# Patient Record
Sex: Male | Born: 1970 | Race: Black or African American | Hispanic: No | Marital: Married | Smoking: Current some day smoker
Health system: Southern US, Community
[De-identification: ages and names within clinical notes are randomized; demographics above are authoritative.]

## PROBLEM LIST (undated history)

## (undated) DIAGNOSIS — E119 Type 2 diabetes mellitus without complications: Secondary | ICD-10-CM

## (undated) DIAGNOSIS — Z87442 Personal history of urinary calculi: Secondary | ICD-10-CM

## (undated) DIAGNOSIS — G43909 Migraine, unspecified, not intractable, without status migrainosus: Secondary | ICD-10-CM

## (undated) DIAGNOSIS — G629 Polyneuropathy, unspecified: Secondary | ICD-10-CM

## (undated) DIAGNOSIS — E669 Obesity, unspecified: Secondary | ICD-10-CM

## (undated) DIAGNOSIS — G4730 Sleep apnea, unspecified: Secondary | ICD-10-CM

## (undated) DIAGNOSIS — D72829 Elevated white blood cell count, unspecified: Secondary | ICD-10-CM

## (undated) DIAGNOSIS — I10 Essential (primary) hypertension: Secondary | ICD-10-CM

## (undated) DIAGNOSIS — R590 Localized enlarged lymph nodes: Principal | ICD-10-CM

## (undated) DIAGNOSIS — I639 Cerebral infarction, unspecified: Secondary | ICD-10-CM

## (undated) DIAGNOSIS — M5416 Radiculopathy, lumbar region: Secondary | ICD-10-CM

## (undated) DIAGNOSIS — E785 Hyperlipidemia, unspecified: Secondary | ICD-10-CM

## (undated) HISTORY — DX: Hyperlipidemia, unspecified: E78.5

## (undated) HISTORY — PX: CARPAL TUNNEL RELEASE: SHX101

## (undated) HISTORY — DX: Elevated white blood cell count, unspecified: D72.829

## (undated) HISTORY — DX: Cerebral infarction, unspecified: I63.9

## (undated) HISTORY — DX: Localized enlarged lymph nodes: R59.0

## (undated) HISTORY — DX: Radiculopathy, lumbar region: M54.16

## (undated) HISTORY — DX: Essential (primary) hypertension: I10

## (undated) HISTORY — DX: Type 2 diabetes mellitus without complications: E11.9

## (undated) HISTORY — PX: FRACTURE SURGERY: SHX138

---

## 2003-09-07 ENCOUNTER — Emergency Department (HOSPITAL_COMMUNITY): Admission: EM | Admit: 2003-09-07 | Discharge: 2003-09-07 | Payer: Self-pay | Admitting: Emergency Medicine

## 2005-10-01 ENCOUNTER — Encounter: Admission: RE | Admit: 2005-10-01 | Discharge: 2005-10-01 | Payer: Self-pay | Admitting: Family Medicine

## 2009-06-18 ENCOUNTER — Emergency Department (HOSPITAL_COMMUNITY): Admission: EM | Admit: 2009-06-18 | Discharge: 2009-06-19 | Payer: Self-pay | Admitting: Emergency Medicine

## 2009-08-16 ENCOUNTER — Observation Stay (HOSPITAL_COMMUNITY): Admission: EM | Admit: 2009-08-16 | Discharge: 2009-08-16 | Payer: Self-pay | Admitting: Emergency Medicine

## 2009-12-03 ENCOUNTER — Emergency Department (HOSPITAL_COMMUNITY): Admission: EM | Admit: 2009-12-03 | Discharge: 2009-12-03 | Payer: Self-pay | Admitting: Emergency Medicine

## 2009-12-26 ENCOUNTER — Ambulatory Visit (HOSPITAL_COMMUNITY): Admission: RE | Admit: 2009-12-26 | Discharge: 2009-12-26 | Payer: Self-pay | Admitting: Urology

## 2010-01-02 ENCOUNTER — Ambulatory Visit: Payer: Self-pay | Admitting: Oncology

## 2010-01-21 LAB — CBC WITH DIFFERENTIAL/PLATELET
Eosinophils Absolute: 0.4 10*3/uL (ref 0.0–0.5)
HCT: 43.3 % (ref 38.4–49.9)
LYMPH%: 43.4 % (ref 14.0–49.0)
MONO#: 0.6 10*3/uL (ref 0.1–0.9)
NEUT#: 4.7 10*3/uL (ref 1.5–6.5)
Platelets: 318 10*3/uL (ref 140–400)
RBC: 5.02 10*6/uL (ref 4.20–5.82)
WBC: 10.1 10*3/uL (ref 4.0–10.3)

## 2010-01-21 LAB — MORPHOLOGY: RBC Comments: NORMAL

## 2010-01-21 LAB — CHCC SMEAR

## 2010-01-22 LAB — COMPREHENSIVE METABOLIC PANEL
ALT: 22 U/L (ref 0–53)
AST: 24 U/L (ref 0–37)
Alkaline Phosphatase: 67 U/L (ref 39–117)
CO2: 21 mEq/L (ref 19–32)
Sodium: 140 mEq/L (ref 135–145)
Total Bilirubin: 0.5 mg/dL (ref 0.3–1.2)
Total Protein: 8.3 g/dL (ref 6.0–8.3)

## 2010-01-22 LAB — LACTATE DEHYDROGENASE: LDH: 293 U/L — ABNORMAL HIGH (ref 94–250)

## 2010-01-28 LAB — CMV ABS, IGG+IGM (CYTOMEGALOVIRUS)
CMV IgM: <8 AU/mL (ref <30.0)
Cytomegalovirus Ab-IgG: 7.9 IU/mL — ABNORMAL HIGH (ref <0.4)

## 2010-01-28 LAB — EPSTEIN-BARR VIRUS NUCLEAR ANTIGEN ANTIBODY, IGG: EBV NA IgG: 2.57 {ISR} — ABNORMAL HIGH

## 2010-01-28 LAB — EPSTEIN-BARR VIRUS VCA, IGG: EBV VCA IgG: 3.69 {ISR} — ABNORMAL HIGH

## 2010-01-28 LAB — EPSTEIN-BARR VIRUS EARLY D ANTIGEN ANTIBODY, IGG: EBV EA IgG: 1.27 {ISR} — ABNORMAL HIGH

## 2010-04-15 ENCOUNTER — Ambulatory Visit: Payer: Self-pay | Admitting: Oncology

## 2010-09-06 ENCOUNTER — Encounter: Payer: Self-pay | Admitting: Oncology

## 2010-10-29 ENCOUNTER — Emergency Department (HOSPITAL_COMMUNITY)
Admission: EM | Admit: 2010-10-29 | Discharge: 2010-10-29 | Disposition: A | Discharge status: Home / Self Care | Payer: Self-pay | Attending: Emergency Medicine | Admitting: Emergency Medicine

## 2010-10-29 DIAGNOSIS — H53149 Visual discomfort, unspecified: Secondary | ICD-10-CM | POA: Insufficient documentation

## 2010-10-29 DIAGNOSIS — G43909 Migraine, unspecified, not intractable, without status migrainosus: Secondary | ICD-10-CM | POA: Insufficient documentation

## 2010-10-29 DIAGNOSIS — Z79899 Other long term (current) drug therapy: Secondary | ICD-10-CM | POA: Insufficient documentation

## 2010-10-29 DIAGNOSIS — H539 Unspecified visual disturbance: Secondary | ICD-10-CM | POA: Insufficient documentation

## 2010-10-29 DIAGNOSIS — R209 Unspecified disturbances of skin sensation: Secondary | ICD-10-CM | POA: Insufficient documentation

## 2010-10-29 DIAGNOSIS — R110 Nausea: Secondary | ICD-10-CM | POA: Insufficient documentation

## 2010-10-29 DIAGNOSIS — I10 Essential (primary) hypertension: Secondary | ICD-10-CM | POA: Insufficient documentation

## 2010-11-01 LAB — BASIC METABOLIC PANEL
BUN: 9 mg/dL (ref 6–23)
Calcium: 8.7 mg/dL (ref 8.4–10.5)
Creatinine, Ser: 0.99 mg/dL (ref 0.4–1.5)
GFR calc non Af Amer: >60 mL/min (ref >60)
Glucose, Bld: 100 mg/dL — ABNORMAL HIGH (ref 70–99)

## 2010-11-01 LAB — CBC
MCHC: 33.6 g/dL (ref 30.0–36.0)
Platelets: 283 10*3/uL (ref 150–400)
RDW: 14.3 % (ref 11.5–15.5)

## 2010-11-01 LAB — APTT: aPTT: 28 seconds (ref 24–37)

## 2010-11-01 LAB — PROTIME-INR: INR: 0.89 (ref 0.00–1.49)

## 2010-11-01 IMAGING — CR DG CHEST 2V
2 series · 2 of 2 positions shown · non-contrast
Comparison: 10/01/2005

CLINICAL DATA: History of renal neoplasm.  Some shortness of
breath.  Nonsmoker.  Hypertension with no other chest complaints.

CHEST - 2 VIEW

[w chest pa]
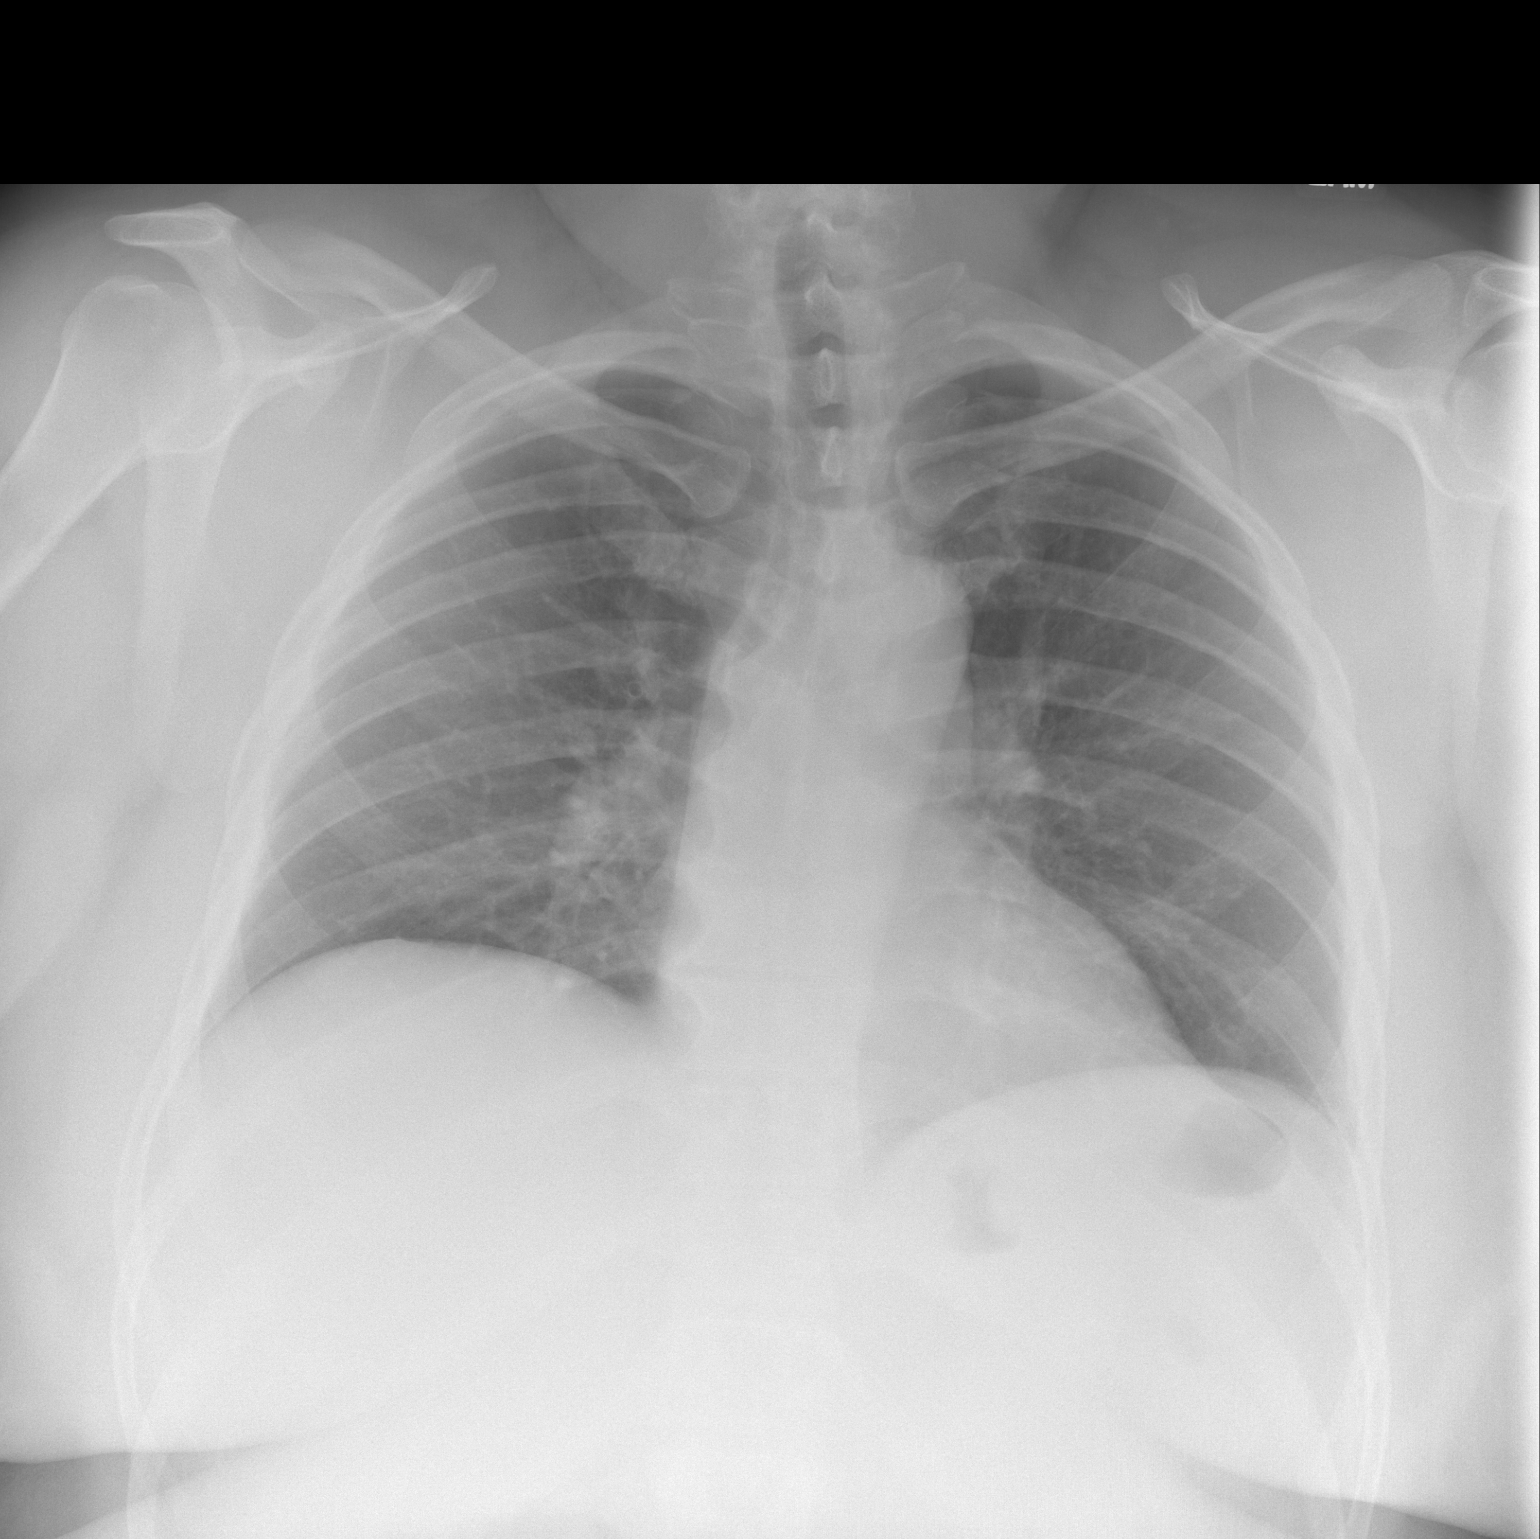

[w chest lat]
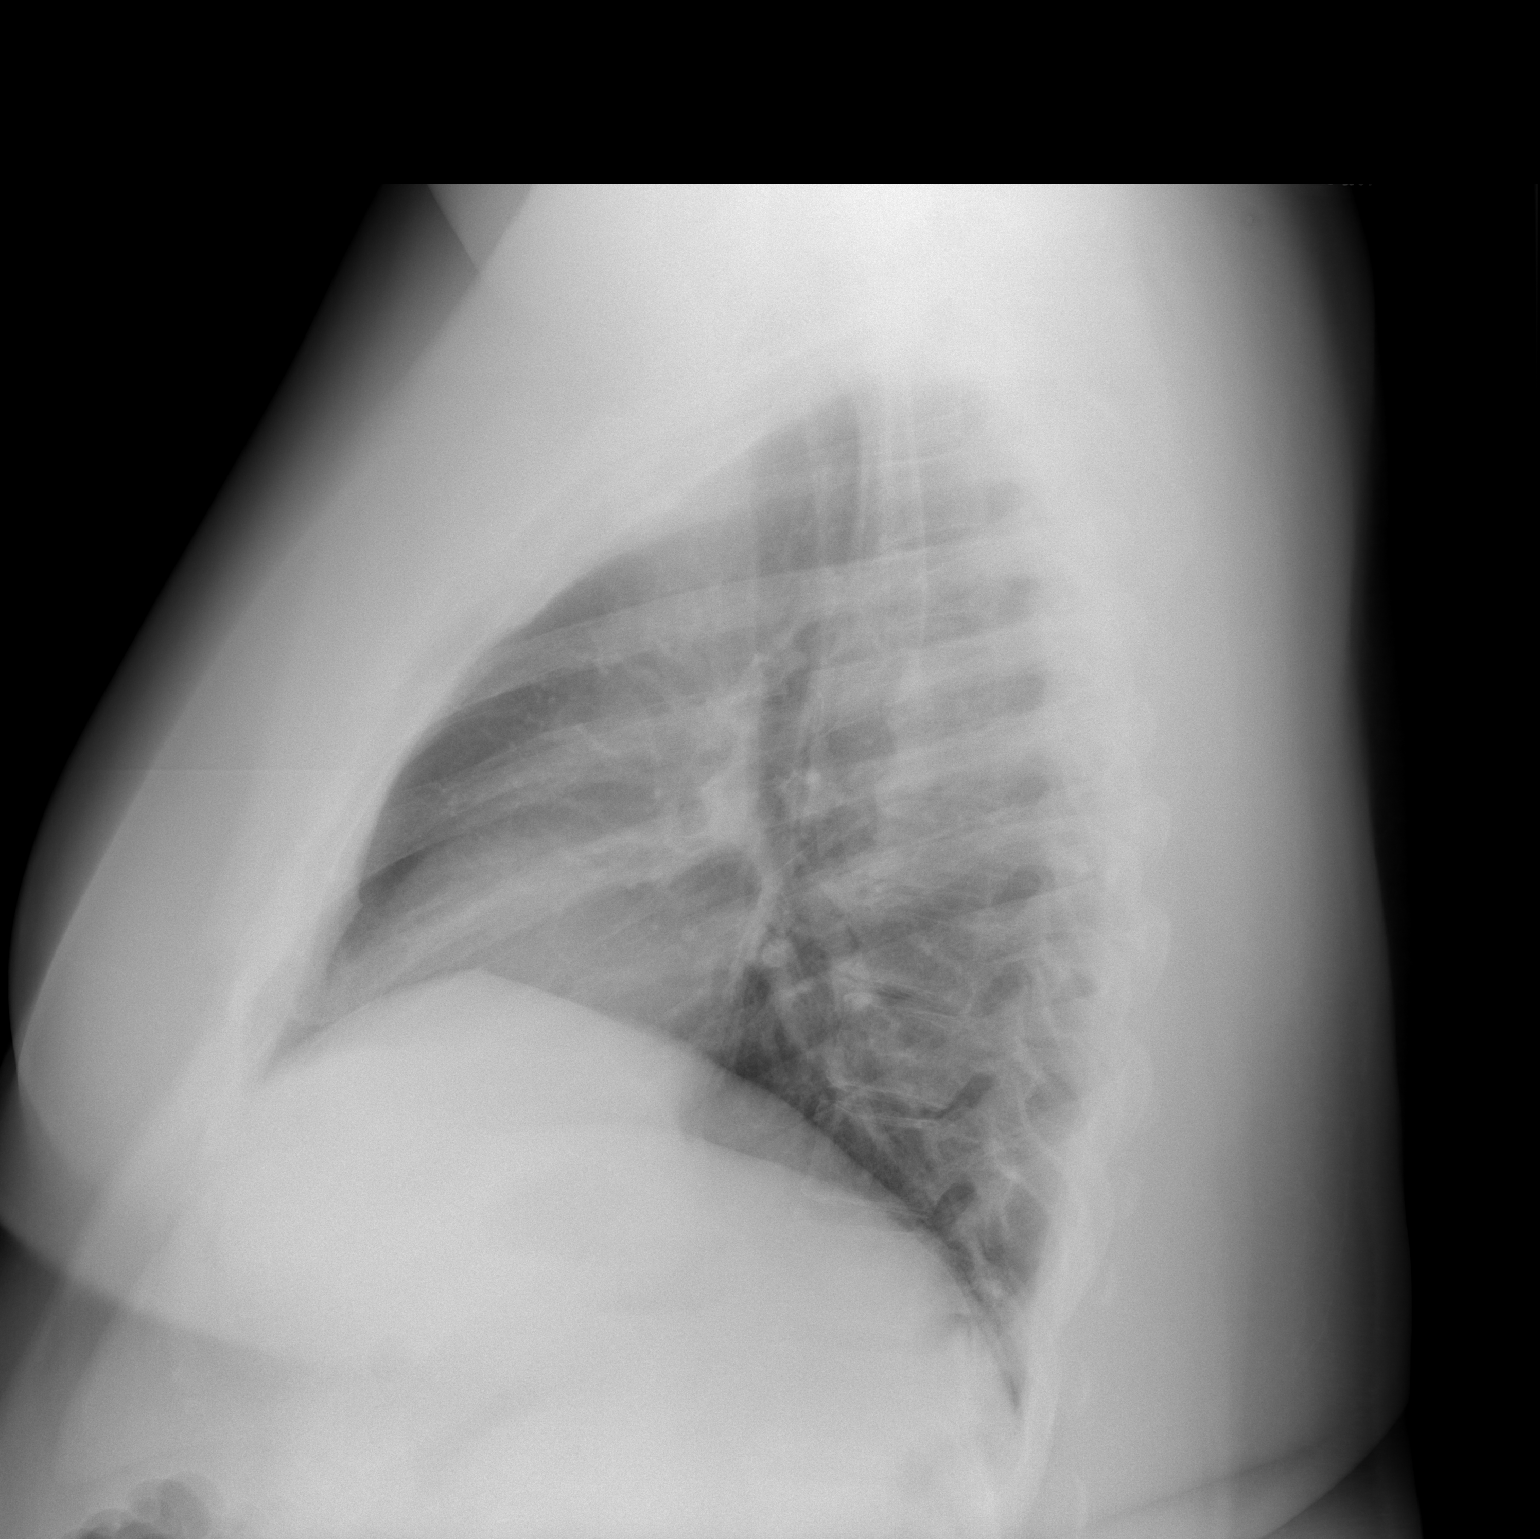

[2 of 2 positions shown; findings below may reference images not displayed]

FINDINGS: Low lung volumes are noted and taking this into
consideration heart size is within normal limits.  A normal
mediastinal configuration is seen.

The lung fields appear clear with no signs of focal infiltrate or
congestive failure.  No pleural fluid or peribronchial cuffing is
noted.

Bony structures demonstrate diffuse osteophytosis along the mid
thoracic spine and are otherwise intact.
IMPRESSION: No acute cardiopulmonary abnormality noted.

## 2010-11-18 LAB — URINALYSIS, ROUTINE W REFLEX MICROSCOPIC
Bilirubin Urine: NEGATIVE
Glucose, UA: NEGATIVE mg/dL
Hgb urine dipstick: NEGATIVE
Protein, ur: NEGATIVE mg/dL
Specific Gravity, Urine: 1.012 (ref 1.005–1.030)
Urobilinogen, UA: 1 mg/dL (ref 0.0–1.0)

## 2010-11-18 LAB — BASIC METABOLIC PANEL
CO2: 27 mEq/L (ref 19–32)
Calcium: 9.1 mg/dL (ref 8.4–10.5)
Chloride: 104 mEq/L (ref 96–112)
Glucose, Bld: 120 mg/dL — ABNORMAL HIGH (ref 70–99)
Sodium: 138 mEq/L (ref 135–145)

## 2011-12-16 ENCOUNTER — Emergency Department (HOSPITAL_COMMUNITY): Payer: Self-pay

## 2011-12-16 ENCOUNTER — Emergency Department (HOSPITAL_COMMUNITY)
Admission: EM | Admit: 2011-12-16 | Discharge: 2011-12-16 | Disposition: A | Discharge status: Home / Self Care | Payer: Self-pay | Attending: Emergency Medicine | Admitting: Emergency Medicine

## 2011-12-16 ENCOUNTER — Encounter (HOSPITAL_COMMUNITY): Payer: Self-pay | Admitting: *Deleted

## 2011-12-16 DIAGNOSIS — R51 Headache: Secondary | ICD-10-CM | POA: Insufficient documentation

## 2011-12-16 DIAGNOSIS — R000 Tachycardia, unspecified: Secondary | ICD-10-CM | POA: Insufficient documentation

## 2011-12-16 DIAGNOSIS — I10 Essential (primary) hypertension: Secondary | ICD-10-CM | POA: Insufficient documentation

## 2011-12-16 DIAGNOSIS — R42 Dizziness and giddiness: Secondary | ICD-10-CM | POA: Insufficient documentation

## 2011-12-16 DIAGNOSIS — R509 Fever, unspecified: Secondary | ICD-10-CM | POA: Insufficient documentation

## 2011-12-16 DIAGNOSIS — J189 Pneumonia, unspecified organism: Secondary | ICD-10-CM | POA: Insufficient documentation

## 2011-12-16 HISTORY — DX: Essential (primary) hypertension: I10

## 2011-12-16 MED ORDER — SODIUM CHLORIDE 0.9 % IV BOLUS (SEPSIS)
500.0000 mL | Freq: Once | INTRAVENOUS | Status: AC
  Administered 2011-12-16: 500 mL via INTRAVENOUS

## 2011-12-16 MED ORDER — CLONIDINE HCL 0.1 MG PO TABS
0.1000 mg | ORAL_TABLET | Freq: Once | ORAL | Status: AC
  Administered 2011-12-16: 0.1 mg via ORAL
  Filled 2011-12-16: qty 1

## 2011-12-16 MED ORDER — IBUPROFEN 800 MG PO TABS
800.0000 mg | ORAL_TABLET | Freq: Once | ORAL | Status: AC
  Administered 2011-12-16: 800 mg via ORAL
  Filled 2011-12-16: qty 1

## 2011-12-16 MED ORDER — AZITHROMYCIN 250 MG PO TABS: ORAL_TABLET | ORAL | Status: AC

## 2011-12-16 MED ORDER — CLONIDINE HCL 0.1 MG PO TABS: 0.1000 mg | ORAL_TABLET | Freq: Two times a day (BID) | ORAL | Status: DC

## 2011-12-16 MED ORDER — ACETAMINOPHEN 325 MG PO TABS
650.0000 mg | ORAL_TABLET | Freq: Once | ORAL | Status: AC
  Administered 2011-12-16: 650 mg via ORAL
  Filled 2011-12-16: qty 2

## 2011-12-16 NOTE — ED Notes (Signed)
Pt states "when I get up I'm dizzy, my body just aches, my ears hurt, had this old cough, have a headache"; pt admits productive cough that is yellowish-green

## 2011-12-16 NOTE — ED Provider Notes (Signed)
History     CSN: [id number]  Arrival date & time 12/16/11  3086   First MD Initiated Contact with Patient 12/16/11 1916      Chief Complaint  Patient presents with  . Cough  . Headache  . Otalgia  . Generalized Body Aches  . Dizziness    (Consider location/radiation/quality/duration/timing/severity/associated sxs/prior treatment) The history is provided by the patient.   41 year old male with a history of only hypertension presents to the emergency department with a chief complaint of fever for the last 2 days. It has been associated with bilateral ear pain, moderate intensity headache, nonproductive cough (to me, though he noted to triage nurse that it was productive of yellow sputum), and myalgias. Today, he began to arrange dizziness described as lightheadedness when he stood. The symptoms resolve after 1-2 minutes. He denies any visual disturbance, rhinorrhea or nasal congestion, sore throat, chest pain, shortness of breath, abdominal pain, nausea or vomiting, weakness or numbness to the arms or legs, trouble speaking, or difficulty ambulating. Denies any rash. Nothing makes the symptoms better or worse. Has taken ibuprofen a couple of times for the headaches with minimal relief. He should also notes that he suspects his blood pressure as high as he ran out of his blood pressure medication one week ago.  Past Medical History  Diagnosis Date  . Hypertension     History reviewed. No pertinent past surgical history.  No family history on file.  History  Substance Use Topics  . Smoking status: Never Smoker   . Smokeless tobacco: Not on file  . Alcohol Use: No      Review of Systems  Constitutional: Positive for fever and chills.  HENT: Positive for ear pain. Negative for hearing loss, congestion, sore throat, neck pain and neck stiffness.   Respiratory: Positive for cough. Negative for shortness of breath.   Genitourinary: Positive for frequency. Negative for dysuria.    Musculoskeletal: Positive for myalgias. Negative for gait problem.  Neurological: Positive for light-headedness and headaches. Negative for syncope, speech difficulty, weakness and numbness.  All other systems reviewed and are negative.    Allergies  Review of patient's allergies indicates no known allergies.  Home Medications   Current Outpatient Rx  Name Route Sig Dispense Refill  . CARVEDILOL 25 MG PO TABS Oral Take 25 mg by mouth 2 (two) times daily with a meal.    . CLONIDINE HCL 0.1 MG PO TABS Oral Take 0.1 mg by mouth 2 (two) times daily.    Marland Kitchen FEXOFENADINE-PSEUDOEPHED ER 180-240 MG PO TB24 Oral Take 1 tablet by mouth daily.    . FUROSEMIDE 40 MG PO TABS Oral Take 40 mg by mouth daily.    . IBUPROFEN 400 MG PO TABS Oral Take 400 mg by mouth every 6 (six) hours as needed. For headache pain relief      BP 171/102  Pulse 122  Temp(Src) 102.5 F (39.2 C) (Oral)  Resp 22  Wt 351 lb (159.213 kg)  SpO2 95%  Physical Exam Vital signs are reviewed and are significant for fever, tachycardia, hypertension. Nursing note reviewed. Patient is well-developed, well-nourished, uncomfortable appearing. Nontoxic appearing. Head is normocephalic and atraumatic. Bilateral external ears, ear canals, tympanic membranes are normal. Nose is normal. Tongue with thick white substance, easily scraped off with no tenderness or erythema seen below. Oropharynx with several areas of erythema and exudate. Are equal, round, reactive to light. Conjunctiva are normal. Extraocular movements are intact without nystagmus. Neck is supple without adenopathy.  Heart tachycardia, regular rhythm. Normal heart sounds. Normal respiratory effort and excursion. Lungs are clear to auscultation bilaterally. Abdomen soft, obese. Nontender. Normal bowel sounds.  Extremities are without edema or tenderness. Is alert and oriented x3. Cranial nerves II through XII are tested and are intact. Finger to nose is intact  bilaterally. Gait is normal. Romberg negative. Speech is clear. Bilateral grip strength 5 out of 5. Skin is warm and dry without any obvious rash or lesion. ED Course  Procedures (including critical care time)  Labs Reviewed  GLUCOSE, CAPILLARY - Abnormal; Notable for the following:    Glucose-Capillary 162 (*)    All other components within normal limits  RAPID STREP SCREEN   Dg Chest 2 View  12/16/2011  *RADIOLOGY REPORT*  Clinical Data: Cough and dizziness.  CHEST - 2 VIEW  Comparison: 12/26/2009.  Findings: The cardiac silhouette, mediastinal and hilar contours are within normal limits and stable.  There is airspace opacity in the left lower lobe consistent with pneumonia.  No pleural effusion.  The right lung is clear.  The bony thorax is intact.  IMPRESSION: Left lower lobe pneumonia.  Original Report Authenticated By: P. Loralie Champagne, M.D.     Dx 1: CAP Dx 2: HTN   MDM  2 day of illness. Given fever, cough will obtain CXR to r/o pneumonia. Strep screen ordered due to fever and exudate seen in oropharynx. Blood sugar checked due to thick white material on tongue and polyuria. Fever treated with ibuprofen. Will monitor closely.     9:12 PM LLL pneumonia. Will tx for CAP. Mild hyperglycemia, discussed appropriate follow-up. Remains tachycardic. Will give acetaminophen, small fluid bolus.    10:51 PM Fever, tachycardia resolved. Pt ready for d/c home. Return precautions discussed       Shaaron Adler, PA-C 12/16/11 2252

## 2011-12-16 NOTE — Discharge Instructions (Signed)
Pneumonia, Adult Pneumonia is an infection of the lungs.  CAUSES Pneumonia may be caused by bacteria or a virus. Usually, these infections are caused by breathing infectious particles into the lungs (respiratory tract). SYMPTOMS   Cough.   Fever.   Chest pain.   Increased rate of breathing.   Wheezing.   Mucus production.  DIAGNOSIS  If you have the common symptoms of pneumonia, your caregiver will typically confirm the diagnosis with a chest X-ray. The X-ray will show an abnormality in the lung (pulmonary infiltrate) if you have pneumonia. Other tests of your blood, urine, or sputum may be done to find the specific cause of your pneumonia. Your caregiver may also do tests (blood gases or pulse oximetry) to see how well your lungs are working. TREATMENT  Some forms of pneumonia may be spread to other people when you cough or sneeze. You may be asked to wear a mask before and during your exam. Pneumonia that is caused by bacteria is treated with antibiotic medicine. Pneumonia that is caused by the influenza virus may be treated with an antiviral medicine. Most other viral infections must run their course. These infections will not respond to antibiotics.  PREVENTION A pneumococcal shot (vaccine) is available to prevent a common bacterial cause of pneumonia. This is usually suggested for:  People over 65 years old.   Patients on chemotherapy.   People with chronic lung problems, such as bronchitis or emphysema.   People with immune system problems.  If you are over 65 or have a high risk condition, you may receive the pneumococcal vaccine if you have not received it before. In some countries, a routine influenza vaccine is also recommended. This vaccine can help prevent some cases of pneumonia.You may be offered the influenza vaccine as part of your care. If you smoke, it is time to quit. You may receive instructions on how to stop smoking. Your caregiver can provide medicines and  counseling to help you quit. HOME CARE INSTRUCTIONS   Cough suppressants may be used if you are losing too much rest. However, coughing protects you by clearing your lungs. You should avoid using cough suppressants if you can.   Your caregiver may have prescribed medicine if he or she thinks your pneumonia is caused by a bacteria or influenza. Finish your medicine even if you start to feel better.   Your caregiver may also prescribe an expectorant. This loosens the mucus to be coughed up.   Only take over-the-counter or prescription medicines for pain, discomfort, or fever as directed by your caregiver.   Do not smoke. Smoking is a common cause of bronchitis and can contribute to pneumonia. If you are a smoker and continue to smoke, your cough may last several weeks after your pneumonia has cleared.   A cold steam vaporizer or humidifier in your room or home may help loosen mucus.   Coughing is often worse at night. Sleeping in a semi-upright position in a recliner or using a couple pillows under your head will help with this.   Get rest as you feel it is needed. Your body will usually let you know when you need to rest.  SEEK IMMEDIATE MEDICAL CARE IF:   Your illness becomes worse. This is especially true if you are elderly or weakened from any other disease.   You cannot control your cough with suppressants and are losing sleep.   You begin coughing up blood.   You develop pain which is getting worse or   is uncontrolled with medicines.   You have a fever.   Any of the symptoms which initially brought you in for treatment are getting worse rather than better.   You develop shortness of breath or chest pain.  MAKE SURE YOU:   Understand these instructions.   Will watch your condition.   Will get help right away if you are not doing well or get worse.  Document Released: 08/02/2005 Document Revised: 07/22/2011 Document Reviewed: 10/22/2010 [facility] Patient Information 2012  [place], [place].            Hypertension Information As your heart beats, it forces blood through your arteries. This force is your blood pressure. If the pressure is too high, it is called hypertension (HTN) or high blood pressure. HTN is dangerous because you may have it and not know it. High blood pressure may mean that your heart has to work harder to pump blood. Your arteries may be narrow or stiff. The extra work puts you at risk for heart disease, stroke, and other problems.  Blood pressure consists of two numbers, a higher number over a lower, 110/72, for example. It is stated as "110 over 72." The ideal is below 120 for the top number (systolic) and under 80 for the bottom (diastolic).  You should pay close attention to your blood pressure if you have certain conditions such as:  Heart failure.   Prior heart attack.   Diabetes   Chronic kidney disease.   Prior stroke.   Multiple risk factors for heart disease.  To see if you have HTN, your blood pressure should be measured while you are seated with your arm held at the level of the heart. It should be measured at least twice. A one-time elevated blood pressure reading (especially in the Emergency Department) does not mean that you need treatment. There may be conditions in which the blood pressure is different between your right and left arms. It is important to see your caregiver soon for a recheck. Most people have essential hypertension which means that there is not a specific cause. This type of high blood pressure may be lowered by changing lifestyle factors such as:  Stress.   Smoking.   Lack of exercise.   Excessive weight.   Drug/tobacco/alcohol use.   Eating less salt.  Most people do not have symptoms from high blood pressure until it has caused damage to the body. Effective treatment can often prevent, delay or reduce that damage. TREATMENT  Treatment for high blood pressure, when a cause has been  identified, is directed at the cause. There are a large number of medications to treat HTN. These fall into several categories, and your caregiver will help you select the medicines that are best for you. Medications may have side effects. You should review side effects with your caregiver. If your blood pressure stays high after you have made lifestyle changes or started on medicines,   Your medication(s) may need to be changed.   Other problems may need to be addressed.   Be certain you understand your prescriptions, and know how and when to take your medicine.   Be sure to follow up with your caregiver within the time frame advised (usually within two weeks) to have your blood pressure rechecked and to review your medications.   If you are taking more than one medicine to lower your blood pressure, make sure you know how and at what times they should be taken. Taking two medicines at the  same time can result in blood pressure that is too low.  Document Released: 10/05/2005 Document Revised: 04/14/2011 Document Reviewed: 10/12/2007 [facility] Patient Information 2012 [place], [place].

## 2011-12-18 NOTE — ED Provider Notes (Signed)
Medical screening examination/treatment/procedure(s) were performed by non-physician practitioner and as supervising physician I was immediately available for consultation/collaboration.  Cyndra Numbers, MD 12/18/11 [phone]

## 2012-05-03 ENCOUNTER — Emergency Department (HOSPITAL_COMMUNITY)
Admission: EM | Admit: 2012-05-03 | Discharge: 2012-05-03 | Disposition: A | Discharge status: Home / Self Care | Payer: BC Managed Care – PPO | Attending: Emergency Medicine | Admitting: Emergency Medicine

## 2012-05-03 ENCOUNTER — Encounter (HOSPITAL_COMMUNITY): Payer: Self-pay | Admitting: *Deleted

## 2012-05-03 ENCOUNTER — Emergency Department (HOSPITAL_COMMUNITY): Payer: BC Managed Care – PPO

## 2012-05-03 DIAGNOSIS — H538 Other visual disturbances: Secondary | ICD-10-CM | POA: Insufficient documentation

## 2012-05-03 DIAGNOSIS — R51 Headache: Secondary | ICD-10-CM | POA: Insufficient documentation

## 2012-05-03 DIAGNOSIS — I10 Essential (primary) hypertension: Secondary | ICD-10-CM | POA: Insufficient documentation

## 2012-05-03 DIAGNOSIS — Z79899 Other long term (current) drug therapy: Secondary | ICD-10-CM | POA: Insufficient documentation

## 2012-05-03 LAB — BASIC METABOLIC PANEL
BUN: 10 mg/dL (ref 6–23)
Chloride: 97 mEq/L (ref 96–112)
GFR calc Af Amer: >90 mL/min (ref >90)
Glucose, Bld: 173 mg/dL — ABNORMAL HIGH (ref 70–99)
Potassium: 3.3 mEq/L — ABNORMAL LOW (ref 3.5–5.1)

## 2012-05-03 LAB — CBC
HCT: 42.7 % (ref 39.0–52.0)
Hemoglobin: 14.3 g/dL (ref 13.0–17.0)
MCH: 28 pg (ref 26.0–34.0)
MCHC: 33.5 g/dL (ref 30.0–36.0)

## 2012-05-03 MED ORDER — CARVEDILOL 25 MG PO TABS: 25.0000 mg | ORAL_TABLET | Freq: Two times a day (BID) | ORAL | Status: DC

## 2012-05-03 MED ORDER — FUROSEMIDE 40 MG PO TABS: 40.0000 mg | ORAL_TABLET | Freq: Every day | ORAL | Status: DC

## 2012-05-03 MED ORDER — SODIUM CHLORIDE 0.9 % IV SOLN
INTRAVENOUS | Status: DC
  Administered 2012-05-03: 16:00:00 via INTRAVENOUS

## 2012-05-03 MED ORDER — CLONIDINE HCL 0.1 MG PO TABS: 0.1000 mg | ORAL_TABLET | Freq: Two times a day (BID) | ORAL | Status: DC

## 2012-05-03 MED ORDER — METOCLOPRAMIDE HCL 5 MG/ML IJ SOLN
10.0000 mg | Freq: Once | INTRAMUSCULAR | Status: AC
  Administered 2012-05-03: 10 mg via INTRAVENOUS
  Filled 2012-05-03: qty 2

## 2012-05-03 MED ORDER — DIPHENHYDRAMINE HCL 50 MG/ML IJ SOLN
25.0000 mg | Freq: Once | INTRAMUSCULAR | Status: AC
  Administered 2012-05-03: 25 mg via INTRAVENOUS
  Filled 2012-05-03: qty 1

## 2012-05-03 MED ORDER — AMLODIPINE BESY-BENAZEPRIL HCL 5-20 MG PO CAPS: 1.0000 | ORAL_CAPSULE | Freq: Every day | ORAL | Status: DC

## 2012-05-03 NOTE — ED Notes (Signed)
Pt states around 11AM today went outside and experienced sudden onset of blurred vision, HA, and numbness to right hand. States "I feel better now." no neuro deficits noted. Pt A&Ox's 3 at present.

## 2012-05-03 NOTE — ED Notes (Signed)
3 unsuccessful attempts to start pts IV. [place] RN at bedside to attempt. Labs collected. 1-light green,1-lavender, 1 light blue collected.

## 2012-05-03 NOTE — ED Provider Notes (Signed)
History     CSN: [id number]  Arrival date & time 05/03/12  1352   First MD Initiated Contact with Patient 05/03/12 1503      Chief Complaint  Patient presents with  . Headache  . Blurred Vision    (Consider location/radiation/quality/duration/timing/severity/associated sxs/prior treatment) The history is provided by the patient.   Patient here complaining of headache that started today 4 hours prior to arrival. Headache was localized to the right side of his head associated with blurred vision and numbness to his right hand. History of similar symptoms in the past and states that this is not the worse headache he has ever had. Denies any neck pain or vomiting. States his headache is gradually resolving. No recent fever or chills. In the past when he has had this has been associated with his high blood pressure. He notes recent noncompliance with his blood pressure meds due 2 lack of insurance. Denies any gait trouble. No change in speech Past Medical History  Diagnosis Date  . Hypertension     History reviewed. No pertinent past surgical history.  History reviewed. No pertinent family history.  History  Substance Use Topics  . Smoking status: Never Smoker   . Smokeless tobacco: Not on file  . Alcohol Use: No      Review of Systems  All other systems reviewed and are negative.    Allergies  Review of patient's allergies indicates no known allergies.  Home Medications   Current Outpatient Rx  Name Route Sig Dispense Refill  . AMLODIPINE BESY-BENAZEPRIL HCL 5-20 MG PO CAPS Oral Take 1 capsule by mouth daily.    Marland Kitchen CARVEDILOL 25 MG PO TABS Oral Take 25 mg by mouth 2 (two) times daily with a meal.    . CLONIDINE HCL 0.1 MG PO TABS Oral Take 1 tablet (0.1 mg total) by mouth 2 (two) times daily. 60 tablet 0  . FUROSEMIDE 40 MG PO TABS Oral Take 40 mg by mouth daily.    Marland Kitchen HYDROCODONE-ACETAMINOPHEN 5-325 MG PO TABS Oral Take 1 tablet by mouth every 6 (six) hours as needed.  Pain    . IBUPROFEN 400 MG PO TABS Oral Take 400 mg by mouth every 6 (six) hours as needed. For headache pain relief      BP 176/109  Pulse 93  Temp 99 F (37.2 C) (Oral)  Resp 23  SpO2 98%  Physical Exam  Nursing note and vitals reviewed. Constitutional: He is oriented to person, place, and time. He appears well-developed and well-nourished.  Non-toxic appearance. No distress.  HENT:  Head: Normocephalic and atraumatic.  Eyes: Conjunctivae normal, EOM and lids are normal. Pupils are equal, round, and reactive to light.  Neck: Normal range of motion. Neck supple. No tracheal deviation present. No mass present.  Cardiovascular: Normal rate, regular rhythm and normal heart sounds.  Exam reveals no gallop.   No murmur heard. Pulmonary/Chest: Effort normal and breath sounds normal. No stridor. No respiratory distress. He has no decreased breath sounds. He has no wheezes. He has no rhonchi. He has no rales.  Abdominal: Soft. Normal appearance and bowel sounds are normal. He exhibits no distension. There is no tenderness. There is no rebound and no CVA tenderness.  Musculoskeletal: Normal range of motion. He exhibits no edema and no tenderness.  Neurological: He is alert and oriented to person, place, and time. He has normal strength. No cranial nerve deficit or sensory deficit. GCS eye subscore is 4. GCS verbal subscore is 5.  GCS motor subscore is 6.  Skin: Skin is warm and dry. No abrasion and no rash noted.  Psychiatric: He has a normal mood and affect. His speech is normal and behavior is normal.    ED Course  Procedures (including critical care time)   Labs Reviewed  BASIC METABOLIC PANEL   No results found.   No diagnosis found.    MDM  Patient given medication for his headache and feels better. No concern for subarachnoid hemorrhage or TIA. He feels that his baseline at this time. Will prescribe his antihypertensive medications and he will followup with his  Dr.        Toy Baker, MD 05/03/12 [phone]

## 2012-05-18 ENCOUNTER — Emergency Department (HOSPITAL_COMMUNITY)
Admission: EM | Admit: 2012-05-18 | Discharge: 2012-05-18 | Disposition: A | Discharge status: Home / Self Care | Payer: BC Managed Care – PPO | Attending: Emergency Medicine | Admitting: Emergency Medicine

## 2012-05-18 ENCOUNTER — Encounter (HOSPITAL_COMMUNITY): Payer: Self-pay | Admitting: Emergency Medicine

## 2012-05-18 ENCOUNTER — Emergency Department (HOSPITAL_COMMUNITY): Payer: BC Managed Care – PPO

## 2012-05-18 DIAGNOSIS — Z79899 Other long term (current) drug therapy: Secondary | ICD-10-CM | POA: Insufficient documentation

## 2012-05-18 DIAGNOSIS — R51 Headache: Secondary | ICD-10-CM | POA: Insufficient documentation

## 2012-05-18 DIAGNOSIS — H538 Other visual disturbances: Secondary | ICD-10-CM | POA: Insufficient documentation

## 2012-05-18 DIAGNOSIS — I959 Hypotension, unspecified: Secondary | ICD-10-CM

## 2012-05-18 DIAGNOSIS — E119 Type 2 diabetes mellitus without complications: Secondary | ICD-10-CM | POA: Insufficient documentation

## 2012-05-18 DIAGNOSIS — R42 Dizziness and giddiness: Secondary | ICD-10-CM | POA: Insufficient documentation

## 2012-05-18 DIAGNOSIS — I10 Essential (primary) hypertension: Secondary | ICD-10-CM | POA: Insufficient documentation

## 2012-05-18 LAB — COMPREHENSIVE METABOLIC PANEL
Alkaline Phosphatase: 71 U/L (ref 39–117)
BUN: 11 mg/dL (ref 6–23)
CO2: 23 mEq/L (ref 19–32)
Chloride: 98 mEq/L (ref 96–112)
Creatinine, Ser: 0.94 mg/dL (ref 0.50–1.35)
GFR calc Af Amer: >90 mL/min (ref >90)
GFR calc non Af Amer: >90 mL/min (ref >90)
Glucose, Bld: 98 mg/dL (ref 70–99)
Total Bilirubin: 0.5 mg/dL (ref 0.3–1.2)

## 2012-05-18 LAB — CBC WITH DIFFERENTIAL/PLATELET
Basophils Relative: 1 % (ref 0–1)
HCT: 44.7 % (ref 39.0–52.0)
Hemoglobin: 15.2 g/dL (ref 13.0–17.0)
Lymphocytes Relative: 39 % (ref 12–46)
Lymphs Abs: 3.5 10*3/uL (ref 0.7–4.0)
MCHC: 34 g/dL (ref 30.0–36.0)
Monocytes Absolute: 0.8 10*3/uL (ref 0.1–1.0)
Monocytes Relative: 9 % (ref 3–12)
Neutro Abs: 4.3 10*3/uL (ref 1.7–7.7)
RBC: 5.42 MIL/uL (ref 4.22–5.81)

## 2012-05-18 LAB — URINALYSIS, ROUTINE W REFLEX MICROSCOPIC
Ketones, ur: NEGATIVE mg/dL
Leukocytes, UA: NEGATIVE
Protein, ur: 30 mg/dL — AB
Urobilinogen, UA: 1 mg/dL (ref 0.0–1.0)

## 2012-05-18 LAB — TROPONIN I: Troponin I: <0.3 ng/mL (ref <0.30)

## 2012-05-18 LAB — URINE MICROSCOPIC-ADD ON

## 2012-05-18 LAB — GLUCOSE, CAPILLARY

## 2012-05-18 MED ORDER — MORPHINE SULFATE 2 MG/ML IJ SOLN
2.0000 mg | Freq: Once | INTRAMUSCULAR | Status: AC
  Administered 2012-05-18: 2 mg via INTRAVENOUS
  Filled 2012-05-18: qty 1

## 2012-05-18 MED ORDER — SODIUM CHLORIDE 0.9 % IV BOLUS (SEPSIS)
500.0000 mL | Freq: Once | INTRAVENOUS | Status: AC
  Administered 2012-05-18: 500 mL via INTRAVENOUS

## 2012-05-18 MED ORDER — METOCLOPRAMIDE HCL 5 MG/ML IJ SOLN
10.0000 mg | Freq: Once | INTRAMUSCULAR | Status: AC
  Administered 2012-05-18: 10 mg via INTRAVENOUS
  Filled 2012-05-18: qty 2

## 2012-05-18 NOTE — ED Notes (Signed)
Per EMS: pt was pumping gas when he had an episode of blurred vision.  Denies any LOC or N/V. Vision returned to normal and asked to be brought to ED for evaluation. Stroke score was negative. Per pt: pt states that he was at the gas station and began experiencing confusion.  States that he could not remember his children's names and had difficulty communicating with the attendant that he wanted to pay for gas.  States that he began experiencing blurred vision and felt "hot".  States that he sat for 10 min and then made his way to his son's place of business.  States that his vision returned to normal and wanted to come in to be evaluated.

## 2012-05-18 NOTE — ED Notes (Signed)
[id number]<WG> Expected date:<BR> Expected time: 4:10 PM<BR> Means of arrival:Ambulance<BR> Comments:<BR> 41yoM- blurred vision

## 2012-05-18 NOTE — ED Provider Notes (Addendum)
History     CSN: [id number]  Arrival date & time 05/18/12  1617   First MD Initiated Contact with Patient 05/18/12 1627      Chief Complaint  Patient presents with  . Blurred Vision    (Consider location/radiation/quality/duration/timing/severity/associated sxs/prior treatment) HPI Pt seen in sept with uncontrolled HTN due to non-compliance states he has been taking BP meds as directed and was recently started on Metformin. He felt at his baseline this morning and went to a gas station around 1. While sitting in his car he became hot. He felt lightheaded and his vision blurred. Pt says he tried to remember is sons names and had difficulty. Had difficulty explaining to gas attendant how much gas he wanted. His son took him to a local fire station and SBP was 110 per son. Pt states he now feels much better though he has a mild frontal HA. No focal weakness or sensory changes. No CP, SOB, N/V/D, abd pain, lower ext swelling or pain at any time Past Medical History  Diagnosis Date  . Hypertension   . Diabetes mellitus     History reviewed. No pertinent past surgical history.  No family history on file.  History  Substance Use Topics  . Smoking status: Never Smoker   . Smokeless tobacco: Not on file  . Alcohol Use: No      Review of Systems  Constitutional: Negative for fever and chills.  HENT: Negative for neck pain.   Eyes: Positive for visual disturbance.  Respiratory: Negative for chest tightness and shortness of breath.   Cardiovascular: Negative for chest pain, palpitations and leg swelling.  Gastrointestinal: Negative for nausea, vomiting and abdominal pain.  Genitourinary: Negative for dysuria.  Musculoskeletal: Negative for back pain.  Skin: Negative for rash and wound.  Neurological: Positive for dizziness, light-headedness and headaches. Negative for tremors, seizures, syncope, speech difficulty, weakness and numbness.    Allergies  Review of patient's  allergies indicates no known allergies.  Home Medications   Current Outpatient Rx  Name Route Sig Dispense Refill  . AMLODIPINE BESY-BENAZEPRIL HCL 5-20 MG PO CAPS Oral Take 1 capsule by mouth daily. 30 capsule 0  . CARVEDILOL 25 MG PO TABS Oral Take 25 mg by mouth 2 (two) times daily with a meal.    . CLONIDINE HCL 0.1 MG PO TABS Oral Take 1 tablet (0.1 mg total) by mouth 2 (two) times daily. 60 tablet 0  . FUROSEMIDE 40 MG PO TABS Oral Take 1 tablet (40 mg total) by mouth daily. 30 tablet 0  . HYDROCODONE-ACETAMINOPHEN 5-325 MG PO TABS Oral Take 1 tablet by mouth every 6 (six) hours as needed. Pain    . METFORMIN HCL ER 500 MG PO TB24 Oral Take 1,000 mg by mouth daily with breakfast.      BP 168/95  Pulse 87  Temp 98.8 F (37.1 C) (Oral)  Resp 23  SpO2 97%  Physical Exam  Nursing note and vitals reviewed. Constitutional: He is oriented to person, place, and time. He appears well-developed and well-nourished. No distress.  HENT:  Head: Normocephalic and atraumatic.  Mouth/Throat: Oropharynx is clear and moist.  Eyes: EOM are normal. Pupils are equal, round, and reactive to light.  Neck: Normal range of motion. Neck supple.  Cardiovascular: Normal rate and regular rhythm.   Pulmonary/Chest: Effort normal and breath sounds normal. No respiratory distress. He has no wheezes. He has no rales. He exhibits no tenderness.  Abdominal: Soft. Bowel sounds are normal. He  exhibits no mass. There is no tenderness. There is no rebound and no guarding.  Musculoskeletal: Normal range of motion. He exhibits no edema and no tenderness.  Neurological: He is alert and oriented to person, place, and time.       5/5 motor in all ext. Sensation intact, finger to nose intact  Skin: Skin is warm and dry. No rash noted. No erythema.  Psychiatric: He has a normal mood and affect. His behavior is normal.    ED Course  Procedures (including critical care time)  Labs Reviewed  GLUCOSE, CAPILLARY -  Abnormal; Notable for the following:    Glucose-Capillary 101 (*)     All other components within normal limits  COMPREHENSIVE METABOLIC PANEL - Abnormal; Notable for the following:    AST 38 (*)     All other components within normal limits  URINALYSIS, ROUTINE W REFLEX MICROSCOPIC - Abnormal; Notable for the following:    Protein, ur 30 (*)     All other components within normal limits  URINE MICROSCOPIC-ADD ON - Abnormal; Notable for the following:    Casts HYALINE CASTS (*)     All other components within normal limits  CBC WITH DIFFERENTIAL  TROPONIN I   Ct Head Wo Contrast  05/18/2012  *RADIOLOGY REPORT*  Clinical Data:  Altered mental status  CT HEAD WITHOUT CONTRAST  Technique:  Contiguous axial images were obtained from the base of the skull through the vertex without contrast  Comparison:  05/03/2012  Findings:  The brain has a normal appearance without evidence for hemorrhage, acute infarction, hydrocephalus, or mass lesion.  There is no extra axial fluid collection.  The skull and paranasal sinuses are normal.  IMPRESSION: Normal CT of the head without contrast.   Original Report Authenticated By: Camelia Phenes, M.D.      1. Hypotension     Date: 05/18/2012  Rate: 76  Rhythm: normal sinus rhythm  QRS Axis: normal  Intervals: normal  ST/T Wave abnormalities: normal  Conduction Disutrbances:none  Narrative Interpretation:   Old EKG Reviewed: unchanged     MDM  Suspect BP may have been over-corrected resulting in pt's symptoms. Will rule out other causes  Pt remains asymptomatic is ED. Will have him hold his clonidine until re-eavl'd by PMD. Advised to return for worsening symptoms      Loren Racer, MD 05/18/12 Babette Relic  Loren Racer, MD 05/18/12 2018

## 2012-05-26 ENCOUNTER — Other Ambulatory Visit: Payer: Self-pay | Admitting: Oncology

## 2012-05-29 ENCOUNTER — Other Ambulatory Visit: Payer: Self-pay | Admitting: Oncology

## 2012-05-29 ENCOUNTER — Telehealth: Payer: Self-pay | Admitting: *Deleted

## 2012-05-29 DIAGNOSIS — R591 Generalized enlarged lymph nodes: Secondary | ICD-10-CM

## 2012-05-29 NOTE — Telephone Encounter (Signed)
Left voice message to inform the patient of the new date and time on 06-05-2012 and 06-21-2012

## 2012-05-30 ENCOUNTER — Telehealth: Payer: Self-pay | Admitting: Oncology

## 2012-05-30 NOTE — Telephone Encounter (Signed)
C/D 05/30/12 for appt. 06/21/12

## 2012-06-05 ENCOUNTER — Other Ambulatory Visit: Payer: Self-pay | Admitting: Oncology

## 2012-06-05 ENCOUNTER — Telehealth: Payer: Self-pay | Admitting: *Deleted

## 2012-06-05 ENCOUNTER — Other Ambulatory Visit (HOSPITAL_BASED_OUTPATIENT_CLINIC_OR_DEPARTMENT_OTHER): Payer: BC Managed Care – PPO

## 2012-06-05 ENCOUNTER — Ambulatory Visit (HOSPITAL_COMMUNITY)
Admission: RE | Admit: 2012-06-05 | Discharge: 2012-06-05 | Disposition: A | Discharge status: Home / Self Care | Payer: BC Managed Care – PPO | Source: Ambulatory Visit | Attending: Oncology | Admitting: Oncology

## 2012-06-05 DIAGNOSIS — R591 Generalized enlarged lymph nodes: Secondary | ICD-10-CM

## 2012-06-05 DIAGNOSIS — R599 Enlarged lymph nodes, unspecified: Secondary | ICD-10-CM

## 2012-06-05 DIAGNOSIS — K7689 Other specified diseases of liver: Secondary | ICD-10-CM | POA: Insufficient documentation

## 2012-06-05 LAB — CBC WITH DIFFERENTIAL/PLATELET
Basophils Absolute: 0.1 10*3/uL (ref 0.0–0.1)
EOS%: 5.3 % (ref 0.0–7.0)
LYMPH%: 46 % (ref 14.0–49.0)
MCH: 27.4 pg (ref 27.2–33.4)
MCV: 86.3 fL (ref 79.3–98.0)
MONO%: 5.9 % (ref 0.0–14.0)
Platelets: 299 10*3/uL (ref 140–400)
RBC: 5.14 10*6/uL (ref 4.20–5.82)
RDW: 14.6 % (ref 11.0–14.6)

## 2012-06-05 LAB — COMPREHENSIVE METABOLIC PANEL (CC13)
AST: 20 U/L (ref 5–34)
Albumin: 4.2 g/dL (ref 3.5–5.0)
Alkaline Phosphatase: 70 U/L (ref 40–150)
BUN: 8 mg/dL (ref 7.0–26.0)
Glucose: 118 mg/dl — ABNORMAL HIGH (ref 70–99)
Potassium: 3.7 mEq/L (ref 3.5–5.1)
Sodium: 139 mEq/L (ref 136–145)
Total Bilirubin: 0.6 mg/dL (ref 0.20–1.20)

## 2012-06-05 MED ORDER — IOHEXOL 300 MG/ML  SOLN
100.0000 mL | Freq: Once | INTRAMUSCULAR | Status: AC | PRN
  Administered 2012-06-05: 100 mL via INTRAVENOUS

## 2012-06-05 NOTE — Telephone Encounter (Signed)
Message copied by Gala Romney on Mon Jun 05, 2012  4:22 PM ------      Message from: Levert Feinstein      Created: Mon Jun 05, 2012  1:38 PM       Call pt - small lymph nodes in abdomen seen 2 years ago have not changed - likely benign

## 2012-06-05 NOTE — Telephone Encounter (Signed)
Called and spoke with pt; per Dr. Cyndie Chime informed pt abdomen lymph nodes seen 2 years ago have not changed - likely benign.  Pt verbalized understanding and confirmed appt for 06/21/12.

## 2012-06-21 ENCOUNTER — Telehealth: Payer: Self-pay | Admitting: Oncology

## 2012-06-21 ENCOUNTER — Encounter: Payer: Self-pay | Admitting: Oncology

## 2012-06-21 ENCOUNTER — Ambulatory Visit (HOSPITAL_BASED_OUTPATIENT_CLINIC_OR_DEPARTMENT_OTHER): Payer: BC Managed Care – PPO | Admitting: Oncology

## 2012-06-21 VITALS — BP 193/112 | HR 98 | Temp 98.4°F | Resp 22 | Ht 72.0 in | Wt 344.5 lb

## 2012-06-21 DIAGNOSIS — E119 Type 2 diabetes mellitus without complications: Secondary | ICD-10-CM | POA: Insufficient documentation

## 2012-06-21 DIAGNOSIS — M5416 Radiculopathy, lumbar region: Secondary | ICD-10-CM

## 2012-06-21 DIAGNOSIS — R590 Localized enlarged lymph nodes: Secondary | ICD-10-CM

## 2012-06-21 DIAGNOSIS — E1169 Type 2 diabetes mellitus with other specified complication: Secondary | ICD-10-CM | POA: Insufficient documentation

## 2012-06-21 DIAGNOSIS — E1165 Type 2 diabetes mellitus with hyperglycemia: Secondary | ICD-10-CM | POA: Insufficient documentation

## 2012-06-21 DIAGNOSIS — D72820 Lymphocytosis (symptomatic): Secondary | ICD-10-CM

## 2012-06-21 DIAGNOSIS — E669 Obesity, unspecified: Secondary | ICD-10-CM | POA: Insufficient documentation

## 2012-06-21 DIAGNOSIS — I10 Essential (primary) hypertension: Secondary | ICD-10-CM

## 2012-06-21 DIAGNOSIS — D72829 Elevated white blood cell count, unspecified: Secondary | ICD-10-CM

## 2012-06-21 HISTORY — DX: Radiculopathy, lumbar region: M54.16

## 2012-06-21 HISTORY — DX: Localized enlarged lymph nodes: R59.0

## 2012-06-21 HISTORY — DX: Type 2 diabetes mellitus without complications: E11.9

## 2012-06-21 HISTORY — DX: Essential (primary) hypertension: I10

## 2012-06-21 HISTORY — DX: Elevated white blood cell count, unspecified: D72.829

## 2012-06-21 NOTE — Progress Notes (Signed)
Hematology and Oncology Follow Up Visit  Travis Powell [id number] 04-12-1971 41 y.o. 06/21/2012 1:32 PM   Principle Diagnosis: Encounter Diagnoses  Name Primary?  . Abdominal lymphadenopathy Yes  . Leukocytosis   . Left lumbar radiculopathy   . DM2 (diabetes mellitus, type 2)   . HTN (hypertension), benign      [place] History: 41 year old man I evaluated 2 years ago when he was found to have some borderline lymphadenopathy on a CT scan of the abdomen and pelvis done on 12/08/2009 to further evaluate a cystic renal mass.  He had no peripheral lymphadenopathy or organomegaly on exam.  He was not anemic.  He had a borderline elevation of his white count at 10,000 with a relative shift towards lymphocytes, but total lymphocytes still in the normal range.  His chemistry profile is normal.  He had a mild elevation of serum LDH.  ESR borderline elevated at 20 mm.  HIV serology was nonreactive.  ANA negative.  He had evidence of previous exposure to both CMV and EBV viruses, but no evidence for an acute or recent infection.    Although the above testing was nondiagnostic, it does exclude a number of possibilities to explain borderline lymphadenopathy.  In view of his absence of symptoms, I  recommended observation alone at that time.  I planned to see him again with a followup CT scan in 4 months . He failed to keep the followup appointment and was subsequently lost to followup.  He recently got reestablished with [facility]  primary care:  Dr. Laurann Montana and physician assistant Elvera Lennox They strongly encouraged him to followup with me.  In anticipation of today's visit, I obtained a CT scan of the abdomen and pelvis done 06/05/2012. I personally reviewed these images. He has persistent, but unchanged, symmetric, pelvic sidewall lymphadenopathy. Laboratory studies are nearly identical to the studies done through this office 2 years ago with a total white count of 10,000, relative shift towards  lymphocytes with 42% neutrophils,  46% lymphocytes. Hemoglobin remains normal at 14 g and platelet count is 299,000. Once again, LDH is persistently but mildly elevated at 225 normal lab value now up to 220  He remains asymptomatic. He denies any constitutional symptoms. Only other interim medical problem was initiation of Glucophage for type 2 diabetes. He remains hypertensive on medication.   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 He has chronic low back pain. He may need to have a laser surgical procedure soon. 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  Physical Exam: Blood pressure 193/112, pulse 98, temperature 98.4 F (36.9 C), temperature source Oral, resp. rate 22, height 6' (1.829 m), weight 344 lb 8 oz (156.264 kg). Wt Readings from Last 3 Encounters:  06/21/12 344 lb 8 oz (156.264 kg)  12/16/11 351 lb (159.213 kg)     General appearance: Pleasant, obese, African American man HENNT: He is wearing dentures. Her external erythema or exudate Lymph nodes: No cervical, supraclavicular, axillary, or inguinal adenopathy Breasts: Lungs: Clear to auscultation resonant to percussion Heart: Regular rhythm no murmur or gallop Abdomen: Soft, obese, nontender, no mass, no gross organomegaly  Extremities: No edema, no calf tenderness Vascular: No cyanosis Neurologic: Motor strength 5 over 5 Skin: No rash or ecchymosis  Lab Results: Lab Results  Component Value Date   WBC 10.0 06/05/2012   HGB 14.1 06/05/2012   HCT 44.3 06/05/2012    MCV 86.3 06/05/2012   PLT 299 06/05/2012     Chemistry      Component Value Date/Time   NA 139 06/05/2012 0931   NA 136 05/18/2012 1725   K 3.7 06/05/2012 0931   K 3.6 05/18/2012 1725   CL 103 06/05/2012 0931   CL 98 05/18/2012 1725   CO2 25 06/05/2012 0931   CO2 23 05/18/2012 1725   BUN 8.0 06/05/2012 0931   BUN 11 05/18/2012 1725   CREATININE 1.0 06/05/2012 0931   CREATININE 0.94 05/18/2012 1725      Component Value Date/Time   CALCIUM 9.9 06/05/2012 0931   CALCIUM 9.2 05/18/2012 1725   ALKPHOS 70 06/05/2012 0931   ALKPHOS 71 05/18/2012 1725   AST 20 06/05/2012 0931   AST 38* 05/18/2012 1725   ALT 23 06/05/2012 0931   ALT 33 05/18/2012 1725   BILITOT 0.60 06/05/2012 0931   BILITOT 0.5 05/18/2012 1725       Radiological Studies: Ct Abdomen Pelvis W Contrast  06/05/2012  *RADIOLOGY REPORT*  Clinical Data: Follow up of borderline abdominal and iliac adenopathy on prior study.  CT ABDOMEN AND PELVIS WITH CONTRAST  Technique:  Multidetector CT imaging of the abdomen and pelvis was performed following the standard protocol during bolus administration of intravenous contrast.  Contrast: OMNIPAQUE IOHEXOL 300 MG/ML  SOLN  Comparison: 12/08/2009 from [facility].  Findings: Clear lung bases.  Normal heart size without pericardial or pleural effusion.  Mild hepatic steatosis. No focal liver lesion.  Normal spleen, stomach, pancreas, gallbladder, biliary tract, adrenal glands.  Too small to characterize interpolar right renal lesion.  An upper pole left renal lesion measures 5.4 x 5.4 cm and primarily fluid density, including on image 38.  On the prior exam, this measured 5.8 x 5.6 cm of same level.  Minimal complexity suspected in its peripheral portion.  Possible thin septation within laterally on image 15 of kidney delays.  Increased number and size of the abdominal retroperitoneal nodes. Index retroaortic node at the level just above the aortic bifurcation measures 11 mm on image 56  and is unchanged.  Normal colon, appendix, and terminal ileum.  Prominent jejunal mesenteric nodes with increased density in the mesenteric fat. Example image 39. Similar to on the prior exam.  The small bowel is otherwise unremarkable, without evidence of ascites.  Redemonstration of bilateral pelvic sidewall adenopathy.  Index right external iliac node measures 3.9 x 1.7 cm on image 85 versus 3.7 x 1.9 cm on the prior. Index left external iliac node measures 3.3 x 1.3 cm today versus 3.9 x 1.9 cm on the prior.  Decreased.  Similar mildly prominent inguinal nodes. Normal urinary bladder and prostate.  No significant free fluid.  Degenerative partial fusion of the bilateral sacroiliac joints.  IMPRESSION:  1.  Similar to minimal improvement in abdominal pelvic adenopathy since 12/08/2009.  This argues for a benign/reactive etiology.  An indolent lymphoproliferative process is felt highly unlikely, given stability over greater than 2 years. 2.  Mild hepatic steatosis. 3.  Left renal lesion which is favored to represent a complex cyst, also similar back to 12/08/2009.   Original Report Authenticated By: Consuello Bossier, M.D.  Impression and Plan: #1. Idiopathic borderline pelvic sidewall adenopathy in an otherwise asymptomatic man which has been stable over a two-year interval of followup. Stability over time argues that this is a benign and not a malignant process. I proposed to the patient that we repeat scans again in one year. If the scan also remained stable that we do not need to do repetitive scanning.  #2. Chronic, stable, upper limit normal, total white blood cell count with shift towards lymphocytes. Serologic evidence for previous exposure to EBV and CMV virus which may account for the chronic mild lymphocytosis. White count today identical to the That we got 2 years ago. Once again, I am comfortable just watching this.  #3. Poorly controlled hypertension. He time he didn't take his medication  before the visit this morning but would take Indocin as he gets home.   CC:. Dr. Aram Beecham White/Scott Gurley; Dr. Genella Rife, MD 11/6/20131:32 PM

## 2012-06-21 NOTE — Telephone Encounter (Signed)
appts made and printed for pt  °

## 2012-06-21 NOTE — Patient Instructions (Signed)
See you back in 1 year; lab and CT scan on 06/19/13   MD visit 06/25/13 at 12:30 PM

## 2013-04-08 ENCOUNTER — Emergency Department (HOSPITAL_COMMUNITY)
Admission: EM | Admit: 2013-04-08 | Discharge: 2013-04-08 | Disposition: A | Discharge status: Home / Self Care | Payer: BC Managed Care – PPO | Attending: Emergency Medicine | Admitting: Emergency Medicine

## 2013-04-08 ENCOUNTER — Encounter (HOSPITAL_COMMUNITY): Payer: Self-pay | Admitting: Emergency Medicine

## 2013-04-08 DIAGNOSIS — I10 Essential (primary) hypertension: Secondary | ICD-10-CM | POA: Insufficient documentation

## 2013-04-08 DIAGNOSIS — H53149 Visual discomfort, unspecified: Secondary | ICD-10-CM | POA: Insufficient documentation

## 2013-04-08 DIAGNOSIS — Z862 Personal history of diseases of the blood and blood-forming organs and certain disorders involving the immune mechanism: Secondary | ICD-10-CM | POA: Insufficient documentation

## 2013-04-08 DIAGNOSIS — IMO0002 Reserved for concepts with insufficient information to code with codable children: Secondary | ICD-10-CM | POA: Insufficient documentation

## 2013-04-08 DIAGNOSIS — H538 Other visual disturbances: Secondary | ICD-10-CM | POA: Insufficient documentation

## 2013-04-08 DIAGNOSIS — G43909 Migraine, unspecified, not intractable, without status migrainosus: Secondary | ICD-10-CM | POA: Insufficient documentation

## 2013-04-08 DIAGNOSIS — E119 Type 2 diabetes mellitus without complications: Secondary | ICD-10-CM | POA: Insufficient documentation

## 2013-04-08 DIAGNOSIS — Z79899 Other long term (current) drug therapy: Secondary | ICD-10-CM | POA: Insufficient documentation

## 2013-04-08 DIAGNOSIS — R209 Unspecified disturbances of skin sensation: Secondary | ICD-10-CM | POA: Insufficient documentation

## 2013-04-08 HISTORY — DX: Migraine, unspecified, not intractable, without status migrainosus: G43.909

## 2013-04-08 MED ORDER — CLONIDINE HCL 0.1 MG PO TABS
0.1000 mg | ORAL_TABLET | Freq: Once | ORAL | Status: AC
  Administered 2013-04-08: 0.1 mg via ORAL
  Filled 2013-04-08: qty 1

## 2013-04-08 MED ORDER — DIPHENHYDRAMINE HCL 50 MG/ML IJ SOLN
25.0000 mg | Freq: Once | INTRAMUSCULAR | Status: AC
  Administered 2013-04-08: 25 mg via INTRAMUSCULAR
  Filled 2013-04-08: qty 1

## 2013-04-08 MED ORDER — CARVEDILOL 25 MG PO TABS: 25.0000 mg | ORAL_TABLET | Freq: Two times a day (BID) | ORAL | Status: DC

## 2013-04-08 MED ORDER — METOCLOPRAMIDE HCL 5 MG/ML IJ SOLN
10.0000 mg | Freq: Once | INTRAMUSCULAR | Status: AC
  Administered 2013-04-08: 10 mg via INTRAMUSCULAR
  Filled 2013-04-08: qty 2

## 2013-04-08 MED ORDER — FUROSEMIDE 40 MG PO TABS: 40.0000 mg | ORAL_TABLET | Freq: Every day | ORAL | Status: DC

## 2013-04-08 MED ORDER — CLONIDINE HCL 0.1 MG PO TABS: 0.1000 mg | ORAL_TABLET | Freq: Two times a day (BID) | ORAL | Status: DC

## 2013-04-08 MED ORDER — AMLODIPINE BESY-BENAZEPRIL HCL 5-20 MG PO CAPS: 1.0000 | ORAL_CAPSULE | Freq: Every day | ORAL | Status: DC

## 2013-04-08 MED ORDER — KETOROLAC TROMETHAMINE 60 MG/2ML IM SOLN
60.0000 mg | Freq: Once | INTRAMUSCULAR | Status: AC
  Administered 2013-04-08: 60 mg via INTRAMUSCULAR
  Filled 2013-04-08: qty 2

## 2013-04-08 NOTE — ED Provider Notes (Signed)
Medical screening examination/treatment/procedure(s) were performed by non-physician practitioner and as supervising physician I was immediately available for consultation/collaboration.  Lyanne Co, MD 04/08/13 2003

## 2013-04-08 NOTE — ED Provider Notes (Signed)
CSN: [id number]     Arrival date & time 04/08/13  1423 History     First MD Initiated Contact with Patient 04/08/13 1637     Chief Complaint  Patient presents with  . Migraine   (Consider location/radiation/quality/duration/timing/severity/associated sxs/prior Treatment) Patient is a 42 y.o. male presenting with migraines. The history is provided by the patient and medical records. No language interpreter was used.  Migraine Associated symptoms include headaches and numbness (right hand). Pertinent negatives include no abdominal pain, chest pain, coughing, diaphoresis, fatigue, fever, nausea, rash or vomiting.    Travis Powell is a 42 y.o. male  with a hx of HTN, DM, migraine presents to the Emergency Department complaining of gradual, persistent, progressively worsening and then gradually improving generalized headache with associated photophobia, blurry vision, right hand tingling onset 4 hours ago.  Pt states he has a hx of migraines and today's symptoms are the same as his previous migraines.  He reports being off his medications for 3 months after he lost his job and could ot afford to see his PCP.  He reports that the reason he came in today is because he was concerned that being off his medications could cause a stroke.  He again endorses no variance in symptoms today from his usual migraines.  Nothing makes it better and light and noise makes it worse.  Pt denies fever, chills, neck pain, neck stiffness, chest pain, SOB, abd pain, N/V/D, weakness, dysuria, hematuria.       Past Medical History  Diagnosis Date  . Hypertension   . Diabetes mellitus   . Abdominal lymphadenopathy 06/21/2012    Borderline abdominal/pelvic adenopathy seen incidentally on CT scan 2011.  No change follow up 10/13  . Leukocytosis 06/21/2012    High normal @ 10,000  41 poly, 46% lymphs stable over time 6// to 10/13 no anemia or thrombocytopenia  . Left lumbar radiculopathy 06/21/2012  . DM2 (diabetes  mellitus, type 2) 06/21/2012  . HTN (hypertension), benign 06/21/2012  . Migraine    No past surgical history on file. No family history on file. History  Substance Use Topics  . Smoking status: Never Smoker   . Smokeless tobacco: Not on file  . Alcohol Use: No    Review of Systems  Constitutional: Negative for fever, diaphoresis, appetite change, fatigue and unexpected weight change.  HENT: Negative for mouth sores and neck stiffness.   Eyes: Positive for photophobia and visual disturbance.  Respiratory: Negative for cough, chest tightness, shortness of breath and wheezing.   Cardiovascular: Negative for chest pain.  Gastrointestinal: Negative for nausea, vomiting, abdominal pain, diarrhea and constipation.  Endocrine: Negative for polydipsia, polyphagia and polyuria.  Genitourinary: Negative for dysuria, urgency, frequency and hematuria.  Musculoskeletal: Negative for back pain.  Skin: Negative for rash.  Allergic/Immunologic: Negative for immunocompromised state.  Neurological: Positive for numbness (right hand) and headaches. Negative for syncope and light-headedness.  Hematological: Does not bruise/bleed easily.  Psychiatric/Behavioral: Negative for sleep disturbance. The patient is not nervous/anxious.     Allergies  Review of patient's allergies indicates no known allergies.  Home Medications   Current Outpatient Rx  Name  Route  Sig  Dispense  Refill  . HYDROcodone-acetaminophen (NORCO/VICODIN) 5-325 MG per tablet   Oral   Take 1 tablet by mouth every 6 (six) hours as needed. Pain         . ibuprofen (ADVIL,MOTRIN) 200 MG tablet   Oral   Take 400 mg by mouth every 6 (six)  hours as needed for pain.         Marland Kitchen amLODipine-benazepril (LOTREL) 5-20 MG per capsule   Oral   Take 1 capsule by mouth daily.   30 capsule   0   . carvedilol (COREG) 25 MG tablet   Oral   Take 25 mg by mouth 2 (two) times daily with a meal.         . cloNIDine (CATAPRES) 0.1 MG  tablet   Oral   Take 1 tablet (0.1 mg total) by mouth 2 (two) times daily.   60 tablet   0   . furosemide (LASIX) 40 MG tablet   Oral   Take 1 tablet (40 mg total) by mouth daily.   30 tablet   0    BP 164/116  Pulse 95  Temp(Src) 98.4 F (36.9 C) (Oral)  Resp 16  SpO2 98% Physical Exam  Nursing note and vitals reviewed. Constitutional: He is oriented to person, place, and time. He appears well-developed and well-nourished. No distress.  HENT:  Head: Normocephalic and atraumatic.  Right Ear: Hearing, tympanic membrane, external ear and ear canal normal.  Left Ear: Hearing, tympanic membrane, external ear and ear canal normal.  Nose: Nose normal. No mucosal edema.  Mouth/Throat: Uvula is midline, oropharynx is clear and moist and mucous membranes are normal. Mucous membranes are not dry. No edematous. No oropharyngeal exudate, posterior oropharyngeal edema, posterior oropharyngeal erythema or tonsillar abscesses.  Eyes: Conjunctivae and EOM are normal. Pupils are equal, round, and reactive to light. Right conjunctiva is not injected. Left conjunctiva is not injected. No scleral icterus. Right eye exhibits normal extraocular motion. Left eye exhibits normal extraocular motion.  Neck: Normal range of motion, full passive range of motion without pain and phonation normal. Neck supple. No spinous process tenderness and no muscular tenderness present. No rigidity. Normal range of motion present.  Full ROM without pain No nuchal rigidity  Cardiovascular: Normal rate, regular rhythm, S1 normal, S2 normal, normal heart sounds and intact distal pulses.   Pulses:      Radial pulses are 2+ on the right side, and 2+ on the left side.       Posterior tibial pulses are 2+ on the right side, and 2+ on the left side.  Pulmonary/Chest: Effort normal and breath sounds normal. No accessory muscle usage. Not tachypneic. No respiratory distress. He has no decreased breath sounds. He has no wheezes. He  has no rhonchi. He has no rales. He exhibits no tenderness and no bony tenderness.  Abdominal: Soft. Bowel sounds are normal. There is no tenderness. There is no rigidity, no rebound and no guarding.  obese  Musculoskeletal: Normal range of motion. He exhibits no tenderness.  Lymphadenopathy:    He has no cervical adenopathy.  Neurological: He is alert and oriented to person, place, and time. He has normal reflexes. No cranial nerve deficit. He exhibits normal muscle tone. Coordination normal.  Speech is clear and goal oriented, follows commands Cranial nerves III - XII without deficit, no facial droop Normal strength in upper and lower extremities bilaterally, strong and equal grip strength Sensation normal to light and sharp touch Moves extremities without ataxia, coordination intact Normal finger to nose and rapid alternating movements Neg romberg, no pronator drift Normal gait Normal heel-shin and balance  Skin: Skin is warm and dry. No rash noted. He is not diaphoretic. No erythema.  Psychiatric: He has a normal mood and affect. His behavior is normal. Judgment and thought content  normal.    ED Course   Procedures (including critical care time)  Labs Reviewed - No data to display No results found. 1. Migraine   2. HTN (hypertension)     MDM  Travis Powell presents with migraine headache and HTN.  Pt with nonspecific neurologic complaints that he reports are normal for his migraines.  No neurologic deficit on exam.  Pt without Hx of CVA or CAD.  NO changes in visual acuity.  Low suspicion for TIA/CVA.  Will treat as complex migraine and reassess.    Pt HA treated and improved while in ED.  Presentation is like pts typical HA and non concerning for [facility], ICH, Meningitis, or temporal arteritis. Pt is afebrile with no focal neuro deficits, nuchal rigidity, or change in vision. Pt with HTN and has been off his medications.  Manual BP 176/98.  Pt is to follow up with PCP to discuss  prophylactic medication. I will refill BP meds for 1 week until pt can see PCP.  Pt verbalizes understanding and is agreeable with plan to dc. I have also discussed reasons to return immediately to the ER.  Patient expresses understanding and agrees with plan.     Dahlia Client Monserrath Junio, PA-C 04/08/13 [phone]

## 2013-04-08 NOTE — ED Notes (Signed)
Pt states that he started having a migraine about an hour ago.  Sensitivity to light and sound.  States that his vision is blurry.  States that this feels like his typical migraine.

## 2013-04-08 NOTE — ED Notes (Signed)
Pt escorted to discharge window. Pt verbalized understanding discharge instructions. In no acute distress.  

## 2013-06-18 ENCOUNTER — Other Ambulatory Visit: Payer: Self-pay | Admitting: *Deleted

## 2013-06-19 ENCOUNTER — Other Ambulatory Visit: Payer: BC Managed Care – PPO | Admitting: Lab

## 2013-06-19 ENCOUNTER — Ambulatory Visit (HOSPITAL_COMMUNITY): Payer: BC Managed Care – PPO

## 2013-06-20 ENCOUNTER — Telehealth: Payer: Self-pay | Admitting: Oncology

## 2013-06-20 ENCOUNTER — Other Ambulatory Visit: Payer: Self-pay | Admitting: *Deleted

## 2013-06-20 NOTE — Telephone Encounter (Signed)
Received an email message from central scheduling that pt ct was r/s to 11/10 and lb will also need to be r/s for same day after ct. Per central pt has no renal issues, is not diabetic and needed an AM appt. appt was r/s due to pt being out of town. Lb r/s'd to 11/10 @ 9:15am. lmonvm for pt re new lb appt d/t and also confirmed 10/10 f/u appt @ 12:30pm. Message sent back to central that JG will need a stat report being that ct was moved to same day as f/u and there is no availability to r/s.

## 2013-06-25 ENCOUNTER — Other Ambulatory Visit: Payer: BC Managed Care – PPO | Admitting: Lab

## 2013-06-25 ENCOUNTER — Ambulatory Visit (HOSPITAL_COMMUNITY): Admission: RE | Admit: 2013-06-25 | Payer: BC Managed Care – PPO | Source: Ambulatory Visit

## 2013-06-25 ENCOUNTER — Ambulatory Visit: Payer: BC Managed Care – PPO | Admitting: Oncology

## 2013-06-26 NOTE — Progress Notes (Signed)
Young man with unexplained borderline pelvic sidewall lymphadenopathy which has been stable on serial CT scans done over a two-year interval. He was supposed to have lab and scans in anticipation of today's visit. He did not show up for the scans of the visit. I will assume he is doing well and only reschedule him if he has problems in the future.

## 2013-06-28 ENCOUNTER — Encounter (HOSPITAL_COMMUNITY): Payer: BC Managed Care – PPO

## 2013-12-07 ENCOUNTER — Emergency Department (HOSPITAL_COMMUNITY)
Admission: EM | Admit: 2013-12-07 | Discharge: 2013-12-07 | Discharge status: Against Medical Advice | Payer: BC Managed Care – PPO | Attending: Emergency Medicine | Admitting: Emergency Medicine

## 2013-12-07 ENCOUNTER — Encounter (HOSPITAL_COMMUNITY): Payer: Self-pay | Admitting: Emergency Medicine

## 2013-12-07 DIAGNOSIS — E669 Obesity, unspecified: Secondary | ICD-10-CM | POA: Insufficient documentation

## 2013-12-07 DIAGNOSIS — E119 Type 2 diabetes mellitus without complications: Secondary | ICD-10-CM | POA: Insufficient documentation

## 2013-12-07 DIAGNOSIS — I10 Essential (primary) hypertension: Secondary | ICD-10-CM | POA: Insufficient documentation

## 2013-12-07 DIAGNOSIS — G43909 Migraine, unspecified, not intractable, without status migrainosus: Secondary | ICD-10-CM | POA: Insufficient documentation

## 2013-12-07 HISTORY — DX: Obesity, unspecified: E66.9

## 2013-12-07 NOTE — ED Notes (Signed)
Pt. reports migraine headache onset yesterday , occasional nausea / emesis .

## 2014-01-28 ENCOUNTER — Encounter (HOSPITAL_COMMUNITY): Payer: Self-pay | Admitting: Emergency Medicine

## 2014-01-28 ENCOUNTER — Emergency Department (HOSPITAL_COMMUNITY): Payer: BC Managed Care – PPO

## 2014-01-28 ENCOUNTER — Emergency Department (HOSPITAL_COMMUNITY)
Admission: EM | Admit: 2014-01-28 | Discharge: 2014-01-28 | Disposition: A | Discharge status: Home / Self Care | Payer: BC Managed Care – PPO | Attending: Emergency Medicine | Admitting: Emergency Medicine

## 2014-01-28 DIAGNOSIS — IMO0002 Reserved for concepts with insufficient information to code with codable children: Secondary | ICD-10-CM | POA: Insufficient documentation

## 2014-01-28 DIAGNOSIS — R200 Anesthesia of skin: Secondary | ICD-10-CM

## 2014-01-28 DIAGNOSIS — R209 Unspecified disturbances of skin sensation: Secondary | ICD-10-CM | POA: Insufficient documentation

## 2014-01-28 DIAGNOSIS — I10 Essential (primary) hypertension: Secondary | ICD-10-CM | POA: Insufficient documentation

## 2014-01-28 DIAGNOSIS — G43909 Migraine, unspecified, not intractable, without status migrainosus: Secondary | ICD-10-CM | POA: Insufficient documentation

## 2014-01-28 DIAGNOSIS — R202 Paresthesia of skin: Secondary | ICD-10-CM

## 2014-01-28 DIAGNOSIS — E119 Type 2 diabetes mellitus without complications: Secondary | ICD-10-CM | POA: Insufficient documentation

## 2014-01-28 DIAGNOSIS — D72829 Elevated white blood cell count, unspecified: Secondary | ICD-10-CM | POA: Insufficient documentation

## 2014-01-28 LAB — BASIC METABOLIC PANEL
BUN: 16 mg/dL (ref 6–23)
CALCIUM: 9.5 mg/dL (ref 8.4–10.5)
CO2: 31 mEq/L (ref 19–32)
CREATININE: 0.96 mg/dL (ref 0.50–1.35)
Chloride: 97 mEq/L (ref 96–112)
GLUCOSE: 173 mg/dL — AB (ref 70–99)
POTASSIUM: 3.3 meq/L — AB (ref 3.7–5.3)
Sodium: 140 mEq/L (ref 137–147)

## 2014-01-28 LAB — CBC WITH DIFFERENTIAL/PLATELET
BASOS PCT: 1 % (ref 0–1)
Basophils Absolute: 0.1 10*3/uL (ref 0.0–0.1)
EOS ABS: 0.4 10*3/uL (ref 0.0–0.7)
EOS PCT: 4 % (ref 0–5)
HCT: 39.9 % (ref 39.0–52.0)
HEMOGLOBIN: 13.4 g/dL (ref 13.0–17.0)
Lymphocytes Relative: 48 % — ABNORMAL HIGH (ref 12–46)
Lymphs Abs: 5.2 10*3/uL — ABNORMAL HIGH (ref 0.7–4.0)
MCH: 28.6 pg (ref 26.0–34.0)
MCHC: 33.6 g/dL (ref 30.0–36.0)
MCV: 85.1 fL (ref 78.0–100.0)
MONO ABS: 0.5 10*3/uL (ref 0.1–1.0)
MONOS PCT: 5 % (ref 3–12)
NEUTROS PCT: 42 % — AB (ref 43–77)
Neutro Abs: 4.5 10*3/uL (ref 1.7–7.7)
Platelets: 287 10*3/uL (ref 150–400)
RBC: 4.69 MIL/uL (ref 4.22–5.81)
RDW: 14.3 % (ref 11.5–15.5)
WBC: 10.7 10*3/uL — ABNORMAL HIGH (ref 4.0–10.5)

## 2014-01-28 MED ORDER — LORAZEPAM 2 MG/ML IJ SOLN: 1.0000 mg | Freq: Once | INTRAMUSCULAR | Status: DC

## 2014-01-28 NOTE — ED Notes (Signed)
Bed: [id number] Expected date:  Expected time:  Means of arrival:  Comments: EMS 39M migraine, numb tongue, finger numbness- ambulatory

## 2014-01-28 NOTE — ED Provider Notes (Signed)
Medical screening examination/treatment/procedure(s) were performed by non-physician practitioner and as supervising physician I was immediately available for consultation/collaboration.   EKG Interpretation None       Trueman Worlds M Add Dinapoli, MD 01/28/14 0828 

## 2014-01-28 NOTE — ED Notes (Signed)
[facility] , no complaints at this time , Vss

## 2014-01-28 NOTE — ED Notes (Signed)
Received bedside report from Italyhad, RCharity fundraiser

## 2014-01-28 NOTE — ED Notes (Signed)
Pt states he was at sleep study and his tongue felt numb,  Then his lip and right hand,  Pt's states when paramedics got there symptoms had resolved,  States he has had this before but only when he had a migraine,  Pt denies pain at this time

## 2014-01-28 NOTE — ED Notes (Signed)
Patient transported to MRI 

## 2014-01-28 NOTE — ED Notes (Signed)
Per EMS pt came from sleep study d/t migraine accompanied by numbness to tongue and b/l hands.  Denies photophobia, nausea or auditory sensitivity.  Pt states migraine did not wake him up.

## 2014-01-28 NOTE — ED Notes (Signed)
Per Primary RN had 2 unsuccessful IV attempts. Third attempt by this RN unsuccessful, Katha CabalS Bingham made aware.

## 2014-01-28 NOTE — ED Provider Notes (Signed)
6:00 AM = Received sign-out from Antony MaduraKelly Humes PA-C to await results of MRI and labs. Dispo home if normal. Neurology consulted on this patient (Dr. Roseanne RenoStewart)    Filed Vitals:   01/28/14 0229 01/28/14 0450  BP: 158/89 141/73  Pulse: 82 81  Temp: 98.2 F (36.8 C) 97.8 F (36.6 C)  TempSrc: Oral Oral  Resp: 20 20  SpO2: 96% 96%    Results for orders placed during the hospital encounter of 01/28/14  CBC WITH DIFFERENTIAL      Result Value Ref Range   WBC 10.7 (*) 4.0 - 10.5 K/uL   RBC 4.69  4.22 - 5.81 MIL/uL   Hemoglobin 13.4  13.0 - 17.0 g/dL   HCT 13.239.9  44.039.0 - 10.252.0 %   MCV 85.1  78.0 - 100.0 fL   MCH 28.6  26.0 - 34.0 pg   MCHC 33.6  30.0 - 36.0 g/dL   RDW 72.514.3  36.611.5 - 44.015.5 %   Platelets 287  150 - 400 K/uL   Neutrophils Relative % 42 (*) 43 - 77 %   Neutro Abs 4.5  1.7 - 7.7 K/uL   Lymphocytes Relative 48 (*) 12 - 46 %   Lymphs Abs 5.2 (*) 0.7 - 4.0 K/uL   Monocytes Relative 5  3 - 12 %   Monocytes Absolute 0.5  0.1 - 1.0 K/uL   Eosinophils Relative 4  0 - 5 %   Eosinophils Absolute 0.4  0.0 - 0.7 K/uL   Basophils Relative 1  0 - 1 %   Basophils Absolute 0.1  0.0 - 0.1 K/uL  BASIC METABOLIC PANEL      Result Value Ref Range   Sodium 140  137 - 147 mEq/L   Potassium 3.3 (*) 3.7 - 5.3 mEq/L   Chloride 97  96 - 112 mEq/L   CO2 31  19 - 32 mEq/L   Glucose, Bld 173 (*) 70 - 99 mg/dL   BUN 16  6 - 23 mg/dL   Creatinine, Ser 3.470.96  0.50 - 1.35 mg/dL   Calcium 9.5  8.4 - 42.510.5 mg/dL   GFR calc non Af Amer >90  >90 mL/min   GFR calc Af Amer >90  >90 mL/min    [facility]. Dr. Rhunette CroftNanavati accepting physician.    Final impressions: 1. Paresthesias in right hand   2. Numbness and tingling of right face        Luiz IronJessica Katlin Marlies Ligman PA-C         Jillyn LedgerJessica K Stevana Dufner, PA-C 01/28/14 [phone]

## 2014-01-28 NOTE — ED Notes (Signed)
Patient returned from MRI.

## 2014-01-28 NOTE — ED Provider Notes (Signed)
CSN: [id number]     Arrival date & time 01/28/14  0227 History   First MD Initiated Contact with Patient 01/28/14 0246     Chief Complaint  Patient presents with  . Migraine  . Numbness    (Consider location/radiation/quality/duration/timing/severity/associated sxs/prior Treatment) HPI Comments: Patient is a 43 year old male with a history of hypertension, diabetes mellitus, and migraine headaches who presents to the emergency department today for paresthesias. Patient states that he was completing a sleep study, when he awoke in the night and felt a numbness to his right tongue. Patient states that he next developed paresthesias in his lips which gradually moved to his right jaw. Patient next endorses a sensation of numbness in his right hand and fingers. Patient states that symptoms lasted approximately 35 minutes before spontaneously resolving. While his symptoms were occurring, patient denies associated fever, vision changes or vision loss, tinnitus or hearing loss, difficulty speaking or swallowing, facial drooping, nausea or vomiting, extremity weakness, and syncope. Patient states that symptoms were consistent with numbness and paresthesias associated with his migraine headaches; however, patient denies experiencing any headache this evening. Patient has not had a recurrence of symptoms since this time.  Patient is a 43 y.o. male presenting with migraines. The history is provided by the patient. No language interpreter was used.  Migraine Associated symptoms include numbness. Pertinent negatives include no weakness.    Past Medical History  Diagnosis Date  . Hypertension   . Diabetes mellitus   . Abdominal lymphadenopathy 06/21/2012    Borderline abdominal/pelvic adenopathy seen incidentally on CT scan 2011.  No change follow up 10/13  . Leukocytosis 06/21/2012    High normal @ 10,000  41 poly, 46% lymphs stable over time 6// to 10/13 no anemia or thrombocytopenia  . Left lumbar  radiculopathy 06/21/2012  . DM2 (diabetes mellitus, type 2) 06/21/2012  . HTN (hypertension), benign 06/21/2012  . Migraine   . Obesity    Past Surgical History  Procedure Laterality Date  . Carpal tunnel release     History reviewed. No pertinent family history. History  Substance Use Topics  . Smoking status: Never Smoker   . Smokeless tobacco: Not on file  . Alcohol Use: No    Review of Systems  Neurological: Positive for numbness. Negative for weakness.       +paresthesias RUE  All other systems reviewed and are negative.    Allergies  Review of patient's allergies indicates no known allergies.  Home Medications   Prior to Admission medications   Medication Sig Start Date End Date Taking? Authorizing Provider  amLODipine (NORVASC) 5 MG tablet Take 5 mg by mouth daily. 01/17/14  Yes Historical Provider, MD  carvedilol (COREG) 25 MG tablet Take 1 tablet (25 mg total) by mouth 2 (two) times daily with a meal. 04/08/13  Yes Hannah Muthersbaugh, PA-C  FARXIGA 5 MG TABS Take 5 mg by mouth daily. 01/18/14  Yes Historical Provider, MD  HYDROcodone-acetaminophen (NORCO/VICODIN) 5-325 MG per tablet Take 1 tablet by mouth every 6 (six) hours as needed. Pain   Yes Historical Provider, MD  ibuprofen (ADVIL,MOTRIN) 200 MG tablet Take 400 mg by mouth every 6 (six) hours as needed for pain.   Yes Historical Provider, MD  losartan-hydrochlorothiazide (HYZAAR) 100-25 MG per tablet Take 1 tablet by mouth daily. 01/17/14  Yes Historical Provider, MD  Vitamin D, Ergocalciferol, (DRISDOL) 50000 UNITS CAPS capsule Take 5,000 Units by mouth every 7 (seven) days. Mondays 01/17/14  Yes Historical Provider, MD  BP 141/73  Pulse 81  Temp(Src) 97.8 F (36.6 C) (Oral)  Resp 20  SpO2 96%  Physical Exam  Nursing note and vitals reviewed. Constitutional: He is oriented to person, place, and time. He appears well-developed and well-nourished. No distress.  Pleasant and well-appearing. Patient in no acute  distress.  HENT:  Head: Normocephalic and atraumatic.  Mouth/Throat: Oropharynx is clear and moist. No oropharyngeal exudate.  Symmetric rise of the uvula with phonation  Eyes: Conjunctivae and EOM are normal. Pupils are equal, round, and reactive to light. No scleral icterus.  Pupils equal round and reactive to direct and consensual light. EOMs normal without nystagmus.  Neck: Normal range of motion. Neck supple.  Cardiovascular: Normal rate, regular rhythm and normal heart sounds.   Pulmonary/Chest: Effort normal and breath sounds normal. No respiratory distress. He has no wheezes. He has no rales.  Musculoskeletal: Normal range of motion.  Neurological: He is alert and oriented to person, place, and time. No cranial nerve deficit. He exhibits normal muscle tone. Coordination normal.  GCS 15. Patient speaks in full goal oriented sentences. No cranial nerve deficits appreciated; symmetric eyebrow raise, no facial drooping, equal tongue protrusion. Normal grip strength bilaterally with 5/5 strength against resistance in all extremities. No gross sensory deficits noted. No pronator drift. Finger to nose intact.  Skin: Skin is warm and dry. No rash noted. He is not diaphoretic. No erythema. No pallor.  Psychiatric: He has a normal mood and affect. His behavior is normal.    ED Course  Procedures (including critical care time) Labs Review Labs Reviewed - No data to display  Imaging Review No results found.   EKG Interpretation None      MDM   Final diagnoses:  Paresthesias in right hand  Numbness and tingling of right face    43 year old male with a history of migraine headaches presents to the emergency department for numbness and paresthesias experienced to his right tongue, gradually migrating to his lips, right jaw, and right upper extremity and hand. Symptoms lasted approximately 35 minutes before spontaneously resolving. Patient states the symptoms consistent with numbness and  paresthesias associated with his migraine headaches; however, patient has not had a migraine headache this evening.  Neurologic exam is nonfocal today. Patient has had no recurrence of his symptoms. I consulted with Dr. Roseanne RenoStewart of neurology regarding patient case. Dr. Roseanne RenoStewart recommends MRI he completed to rule out TIA or stroke. Patient signed out to oncoming ED provider who will followup on imaging and disposition appropriately. Anticipate discharge home with neurology followup if MR negative.   Filed Vitals:   01/28/14 0229 01/28/14 0450  BP: 158/89 141/73  Pulse: 82 81  Temp: 98.2 F (36.8 C) 97.8 F (36.6 C)  TempSrc: Oral Oral  Resp: 20 20  SpO2: 96% 96%      Antony MaduraKelly Mistie Adney, PA-C 01/28/14 [phone]

## 2014-01-28 NOTE — ED Provider Notes (Signed)
D/w Italychad snow, PA for [facility] He sees this patient as outpatient Will see tomorrow in office for continued workup of possible TIA (will need echo/carotid ultrasound)   Joya Gaskinsonald W Damiah Mcdonald, MD 01/28/14 1208

## 2014-01-28 NOTE — ED Provider Notes (Signed)
Pt without any neuro symptoms currently  CONSTITUTIONAL: Well developed/well nourished, pt talking on phone HEAD: Normocephalic/atraumatic EYES: EOMI/PERRL, no nystagmus, ENMT: Mucous membranes moist NECK: supple no meningeal signs, no bruits CV: S1/S2 noted, no murmurs/rubs/gallops noted LUNGS: Lungs are clear to auscultation bilaterally, no apparent distress ABDOMEN: soft, nontender, no rebound or guarding NEURO:Awake/alert, facies symmetric, no arm or leg drift is noted  equal hand grips bilaterally Equal 5/5 strength with hip flexion,knee flex/extension, foot dorsi/plantar flexion Cranial nerves 3/4/5/6/02/21/09/11/12 tested and intact No past pointing Sensation to light touch intact in all extremities NIHSS - 0 EXTREMITIES: pulses normal, full ROM SKIN: warm, color normal PSYCH: no abnormalities of mood noted   MRI without acute infarct (?old trauma per radiology) Report from [facility] was to d/c home if negative MR Will call his PCP for followup for further TIA workup for his neurologic complaints (pt wishes to go home)  Joya Gaskinsonald W Maelie Chriswell, MD 01/28/14 1154

## 2014-01-28 NOTE — ED Provider Notes (Signed)
Medical screening examination/treatment/procedure(s) were performed by non-physician practitioner and as supervising physician I was immediately available for consultation/collaboration.   EKG Interpretation None       Olivia Mackielga M Ganon Demasi, MD 01/28/14 [phone]

## 2014-03-10 ENCOUNTER — Emergency Department (HOSPITAL_COMMUNITY)
Admission: EM | Admit: 2014-03-10 | Discharge: 2014-03-11 | Disposition: A | Discharge status: Home / Self Care | Payer: BC Managed Care – PPO | Attending: Emergency Medicine | Admitting: Emergency Medicine

## 2014-03-10 ENCOUNTER — Encounter (HOSPITAL_COMMUNITY): Payer: Self-pay | Admitting: Emergency Medicine

## 2014-03-10 DIAGNOSIS — Z8739 Personal history of other diseases of the musculoskeletal system and connective tissue: Secondary | ICD-10-CM | POA: Insufficient documentation

## 2014-03-10 DIAGNOSIS — Z862 Personal history of diseases of the blood and blood-forming organs and certain disorders involving the immune mechanism: Secondary | ICD-10-CM | POA: Insufficient documentation

## 2014-03-10 DIAGNOSIS — Z9889 Other specified postprocedural states: Secondary | ICD-10-CM | POA: Insufficient documentation

## 2014-03-10 DIAGNOSIS — E669 Obesity, unspecified: Secondary | ICD-10-CM | POA: Insufficient documentation

## 2014-03-10 DIAGNOSIS — Z79899 Other long term (current) drug therapy: Secondary | ICD-10-CM | POA: Insufficient documentation

## 2014-03-10 DIAGNOSIS — I10 Essential (primary) hypertension: Secondary | ICD-10-CM | POA: Insufficient documentation

## 2014-03-10 DIAGNOSIS — G43109 Migraine with aura, not intractable, without status migrainosus: Secondary | ICD-10-CM

## 2014-03-10 DIAGNOSIS — E119 Type 2 diabetes mellitus without complications: Secondary | ICD-10-CM | POA: Insufficient documentation

## 2014-03-10 DIAGNOSIS — R209 Unspecified disturbances of skin sensation: Secondary | ICD-10-CM | POA: Insufficient documentation

## 2014-03-10 MED ORDER — DIPHENHYDRAMINE HCL 50 MG/ML IJ SOLN
25.0000 mg | Freq: Once | INTRAMUSCULAR | Status: AC
  Administered 2014-03-11: 25 mg via INTRAVENOUS
  Filled 2014-03-10: qty 1

## 2014-03-10 MED ORDER — METOCLOPRAMIDE HCL 5 MG/ML IJ SOLN
10.0000 mg | Freq: Once | INTRAMUSCULAR | Status: AC
  Administered 2014-03-11: 10 mg via INTRAVENOUS
  Filled 2014-03-10: qty 2

## 2014-03-10 NOTE — ED Provider Notes (Signed)
CSN: [id number]     Arrival date & time 03/10/14  2312 History   First MD Initiated Contact with Patient 03/10/14 2325     No chief complaint on file.    (Consider location/radiation/quality/duration/timing/severity/associated sxs/prior Treatment) HPI Comments: Patients with history of migraine headaches -- presents with complaint of paresthesias of right face around his mouth moving to right hand and back to his mouth. Symptoms started at approximately 9 PM tonight. Patient has had similar symptoms in the past, many times, with previously diagnosed migraine headaches. He denies unilateral weakness, confusion, trouble walking, or trouble speaking. He denies slurred speech to me. Patient was seen in emergency department on 01/28/2014 with the same symptoms. He underwent an MRI at that time which did not demonstrate a stroke. He was discharged home and followed up with his PCP. Patient reports that he had negative carotid Dopplers and echocardiogram. He also followed up with a neurologist. He has been prescribed Maxalt for migraine headaches. After his symptoms began tonight, patient talked to Maxalt and laid down for approximately 30 minutes. He did not feel better after waking up and states that he was having difficulty swallowing. He did not try to swallow liquids or solids. He has not had any difficulty with swallowing before and this caused him to be concerned. He came to the emergency department because of this new symptom. Symptoms have since completely resolved. Patient continues to have a headache which is typical for when he has these paresthesias. Onset of symptoms acute. Course is resolved. Nothing makes symptoms better or worse.  The history is provided by the patient and medical records.    Past Medical History  Diagnosis Date  . Hypertension   . Diabetes mellitus   . Abdominal lymphadenopathy 06/21/2012    Borderline abdominal/pelvic adenopathy seen incidentally on CT scan 2011.  No  change follow up 10/13  . Leukocytosis 06/21/2012    High normal @ 10,000  41 poly, 46% lymphs stable over time 6// to 10/13 no anemia or thrombocytopenia  . Left lumbar radiculopathy 06/21/2012  . DM2 (diabetes mellitus, type 2) 06/21/2012  . HTN (hypertension), benign 06/21/2012  . Migraine   . Obesity    Past Surgical History  Procedure Laterality Date  . Carpal tunnel release     No family history on file. History  Substance Use Topics  . Smoking status: Never Smoker   . Smokeless tobacco: Not on file  . Alcohol Use: No    Review of Systems  Constitutional: Negative for fever.  HENT: Negative for congestion, dental problem, rhinorrhea and sinus pressure.   Eyes: Negative for photophobia, discharge, redness and visual disturbance.  Respiratory: Negative for shortness of breath.   Cardiovascular: Negative for chest pain.  Gastrointestinal: Negative for nausea and vomiting.  Musculoskeletal: Negative for gait problem, neck pain and neck stiffness.  Skin: Negative for rash.  Neurological: Positive for numbness (paresthesias) and headaches. Negative for syncope, speech difficulty, weakness and light-headedness.  Psychiatric/Behavioral: Negative for confusion.   Allergies  Review of patient's allergies indicates no known allergies.  Home Medications   Prior to Admission medications   Medication Sig Start Date End Date Taking? Authorizing Provider  amLODipine (NORVASC) 5 MG tablet Take 5 mg by mouth daily. 01/17/14   Historical Provider, MD  carvedilol (COREG) 25 MG tablet Take 1 tablet (25 mg total) by mouth 2 (two) times daily with a meal. 04/08/13   Hannah Muthersbaugh, PA-C  FARXIGA 5 MG TABS Take 5 mg by mouth  daily. 01/18/14   Historical Provider, MD  HYDROcodone-acetaminophen (NORCO/VICODIN) 5-325 MG per tablet Take 1 tablet by mouth every 6 (six) hours as needed. Pain    Historical Provider, MD  ibuprofen (ADVIL,MOTRIN) 200 MG tablet Take 400 mg by mouth every 6 (six) hours as  needed for pain.    Historical Provider, MD  losartan-hydrochlorothiazide (HYZAAR) 100-25 MG per tablet Take 1 tablet by mouth daily. 01/17/14   Historical Provider, MD  Vitamin D, Ergocalciferol, (DRISDOL) 50000 UNITS CAPS capsule Take 5,000 Units by mouth every 7 (seven) days. Mondays 01/17/14   Historical Provider, MD   BP 159/102  Pulse 91  Resp 16  SpO2 97%  Physical Exam  Nursing note and vitals reviewed. Constitutional: He is oriented to person, place, and time. He appears well-developed and well-nourished.  HENT:  Head: Normocephalic and atraumatic.  Right Ear: Tympanic membrane, external ear and ear canal normal.  Left Ear: Tympanic membrane, external ear and ear canal normal.  Nose: Nose normal.  Mouth/Throat: Uvula is midline, oropharynx is clear and moist and mucous membranes are normal.  Eyes: Conjunctivae, EOM and lids are normal. Pupils are equal, round, and reactive to light. Right eye exhibits no discharge. Left eye exhibits no discharge.  Neck: Normal range of motion. Neck supple. Carotid bruit is not present.  Cardiovascular: Normal rate, regular rhythm and normal heart sounds.   Pulmonary/Chest: Effort normal and breath sounds normal.  Abdominal: Soft. There is no tenderness.  Musculoskeletal: Normal range of motion.       Cervical back: He exhibits normal range of motion, no tenderness and no bony tenderness.  Neurological: He is alert and oriented to person, place, and time. He has normal strength and normal reflexes. No cranial nerve deficit or sensory deficit. He exhibits normal muscle tone. He displays a negative Romberg sign. Coordination and gait normal. GCS eye subscore is 4. GCS verbal subscore is 5. GCS motor subscore is 6.  Skin: Skin is warm and dry.  Psychiatric: He has a normal mood and affect.    ED Course  Procedures (including critical care time) Labs Review Labs Reviewed  CBC WITH DIFFERENTIAL - Abnormal; Notable for the following:    WBC 12.8 (*)     Lymphs Abs 5.3 (*)    All other components within normal limits  BASIC METABOLIC PANEL - Abnormal; Notable for the following:    Potassium 3.3 (*)    Chloride 93 (*)    Glucose, Bld 205 (*)    GFR calc non Af Amer 69 (*)    GFR calc Af Amer 79 (*)    Anion gap 19 (*)    All other components within normal limits    Imaging Review No results found.   EKG Interpretation None      11:35 PM Patient seen and examined. Work-up initiated. Medications ordered. No code stroke due to complete resolution of symptoms.   Vital signs reviewed and are as follows: Filed Vitals:   03/10/14 2332  BP: 159/102  Pulse: 91  Resp: 16   1:23 AM Patient rechecked. HA is improving. All symptoms continued to be resolved.   BMP pending.   Discussed with Dr. Ranae Palms.   1:36 AM BMP shows small AG. He has received fluids. OK for discharge.   MDM   Final diagnoses:  None   Patient with history of complex migraine -- with symptoms same as previous -- except for difficulty swallowing. No dysphagia here. Symptoms are resolved. Do not suspect TIA/stroke.  Patient with extensive recent work-up including neg MRI. Neuro exam unremarkable. HA improved with migraine cocktail and fluids. Will d/c to home with PCP f/u [facility]).   Patient without high-risk features of headache including: sudden onset/thunderclap HA, no similar headache in past, altered mental status, accompanying seizure, headache with exertion, age > 16, history of immunocompromised, neck or shoulder pain, fever, use of anticoagulation, family history of spontaneous SAH, concomitant drug use, toxic exposure.   Patient has a normal complete neurological exam at arrival, normal vital signs, normal level of consciousness, no signs of meningismus, is well-appearing/non-toxic appearing, no signs of trauma, no pain over the temporal arteries.   Imaging with CT/MRI not indicated given history and physical exam findings.   No  dangerous or life-threatening conditions suspected or identified by history, physical exam, and by work-up. No indications for hospitalization identified.      Renne Crigler, PA-C 03/11/14 [phone]

## 2014-03-10 NOTE — ED Notes (Signed)
MD at bedside. Wife at bedside 

## 2014-03-10 NOTE — ED Notes (Signed)
Pt states he was having numbness in mouth moving to to BUE and speech slurring. Pt states his physician gave him medication for migraine. NO relief.

## 2014-03-11 LAB — CBC WITH DIFFERENTIAL/PLATELET
BASOS ABS: 0.1 10*3/uL (ref 0.0–0.1)
BASOS PCT: 1 % (ref 0–1)
Eosinophils Absolute: 0.3 10*3/uL (ref 0.0–0.7)
Eosinophils Relative: 2 % (ref 0–5)
HCT: 40.7 % (ref 39.0–52.0)
Hemoglobin: 13.4 g/dL (ref 13.0–17.0)
Lymphocytes Relative: 42 % (ref 12–46)
Lymphs Abs: 5.3 10*3/uL — ABNORMAL HIGH (ref 0.7–4.0)
MCH: 27.6 pg (ref 26.0–34.0)
MCHC: 32.9 g/dL (ref 30.0–36.0)
MCV: 83.7 fL (ref 78.0–100.0)
Monocytes Absolute: 0.7 10*3/uL (ref 0.1–1.0)
Monocytes Relative: 6 % (ref 3–12)
NEUTROS ABS: 6.4 10*3/uL (ref 1.7–7.7)
NEUTROS PCT: 49 % (ref 43–77)
Platelets: 298 10*3/uL (ref 150–400)
RBC: 4.86 MIL/uL (ref 4.22–5.81)
RDW: 14.2 % (ref 11.5–15.5)
WBC: 12.8 10*3/uL — ABNORMAL HIGH (ref 4.0–10.5)

## 2014-03-11 LAB — BASIC METABOLIC PANEL
Anion gap: 19 — ABNORMAL HIGH (ref 5–15)
BUN: 16 mg/dL (ref 6–23)
CHLORIDE: 93 meq/L — AB (ref 96–112)
CO2: 26 mEq/L (ref 19–32)
CREATININE: 1.25 mg/dL (ref 0.50–1.35)
Calcium: 10 mg/dL (ref 8.4–10.5)
GFR, EST AFRICAN AMERICAN: 79 mL/min — AB (ref >90)
GFR, EST NON AFRICAN AMERICAN: 69 mL/min — AB (ref >90)
Glucose, Bld: 205 mg/dL — ABNORMAL HIGH (ref 70–99)
POTASSIUM: 3.3 meq/L — AB (ref 3.7–5.3)
Sodium: 138 mEq/L (ref 137–147)

## 2014-03-11 NOTE — ED Provider Notes (Signed)
Medical screening examination/treatment/procedure(s) were performed by non-physician practitioner and as supervising physician I was immediately available for consultation/collaboration.   EKG Interpretation None        Jarelly Rinck, MD 03/11/14 0840 

## 2014-03-11 NOTE — Discharge Instructions (Signed)
Please read and follow all provided instructions.  Your diagnoses today include:  1. Complicated migraine     Tests performed today include:  Blood counts and electrolytes - blood sugar 200  Vital signs. See below for your results today.   Medications:  In the Emergency Department you received:  Reglan - antinausea/headache medication  Benadryl - antihistamine to counteract potential side effects of reglan  Take any prescribed medications only as directed.  Additional information:  Follow any educational materials contained in this packet.  You are having a headache. It may have been a migraine or other cause of headache. Stress, anxiety, fatigue, and depression are common triggers for headaches.   Your headache today does not appear to be life-threatening or require hospitalization, but often the exact cause of headaches is not determined in the emergency department. Therefore, follow-up with your doctor is very important to find out what may have caused your headache and whether or not you need any further diagnostic testing or treatment.   Sometimes headaches can appear benign (not harmful), but then more serious symptoms can develop which should prompt an immediate re-evaluation by your doctor or the emergency department.  BE VERY CAREFUL not to take multiple medicines containing Tylenol (also called acetaminophen). Doing so can lead to an overdose which can damage your liver and cause liver failure and possibly death.   Follow-up instructions: Please follow-up with your primary care provider in the next 3 days for further evaluation of your symptoms.   Return instructions:   Please return to the Emergency Department if you experience worsening symptoms.  Return if the medications do not resolve your headache, if it recurs, or if you have multiple episodes of vomiting or cannot keep down fluids.  Return if you have a change from the usual headache.  RETURN IMMEDIATELY  IF you:  Develop a sudden, severe headache  Develop confusion or become poorly responsive or faint  Develop a fever above 100.42F or problem breathing  Have a new change in speech, vision, swallowing, or understanding  Develop new weakness, numbness, tingling, incoordination in your arms or legs  Have a seizure  Please return if you have any other emergent concerns.  Additional Information:  Your vital signs today were: BP 153/77   Pulse 92   Temp(Src) 98.7 F (37.1 C) (Oral)   Resp 20   SpO2 94% If your blood pressure (BP) was elevated above 135/85 this visit, please have this repeated by your doctor within one month. --------------

## 2014-06-12 ENCOUNTER — Emergency Department (HOSPITAL_COMMUNITY)
Admission: EM | Admit: 2014-06-12 | Discharge: 2014-06-12 | Disposition: A | Discharge status: Home / Self Care | Payer: BC Managed Care – PPO | Attending: Emergency Medicine | Admitting: Emergency Medicine

## 2014-06-12 ENCOUNTER — Encounter (HOSPITAL_COMMUNITY): Payer: Self-pay | Admitting: Emergency Medicine

## 2014-06-12 DIAGNOSIS — F329 Major depressive disorder, single episode, unspecified: Secondary | ICD-10-CM

## 2014-06-12 DIAGNOSIS — R45851 Suicidal ideations: Secondary | ICD-10-CM | POA: Diagnosis not present

## 2014-06-12 DIAGNOSIS — F4321 Adjustment disorder with depressed mood: Secondary | ICD-10-CM

## 2014-06-12 DIAGNOSIS — E119 Type 2 diabetes mellitus without complications: Secondary | ICD-10-CM | POA: Diagnosis not present

## 2014-06-12 DIAGNOSIS — Z87828 Personal history of other (healed) physical injury and trauma: Secondary | ICD-10-CM | POA: Diagnosis not present

## 2014-06-12 DIAGNOSIS — E669 Obesity, unspecified: Secondary | ICD-10-CM | POA: Insufficient documentation

## 2014-06-12 DIAGNOSIS — Z862 Personal history of diseases of the blood and blood-forming organs and certain disorders involving the immune mechanism: Secondary | ICD-10-CM | POA: Diagnosis not present

## 2014-06-12 DIAGNOSIS — Z8739 Personal history of other diseases of the musculoskeletal system and connective tissue: Secondary | ICD-10-CM | POA: Insufficient documentation

## 2014-06-12 DIAGNOSIS — I10 Essential (primary) hypertension: Secondary | ICD-10-CM | POA: Diagnosis not present

## 2014-06-12 DIAGNOSIS — Z79899 Other long term (current) drug therapy: Secondary | ICD-10-CM | POA: Insufficient documentation

## 2014-06-12 DIAGNOSIS — G43909 Migraine, unspecified, not intractable, without status migrainosus: Secondary | ICD-10-CM | POA: Diagnosis not present

## 2014-06-12 DIAGNOSIS — F32A Depression, unspecified: Secondary | ICD-10-CM

## 2014-06-12 LAB — COMPREHENSIVE METABOLIC PANEL
ALBUMIN: 4.1 g/dL (ref 3.5–5.2)
ALT: 24 U/L (ref 0–53)
ANION GAP: 15 (ref 5–15)
AST: 28 U/L (ref 0–37)
Alkaline Phosphatase: 90 U/L (ref 39–117)
BILIRUBIN TOTAL: 0.3 mg/dL (ref 0.3–1.2)
BUN: 14 mg/dL (ref 6–23)
CO2: 28 mEq/L (ref 19–32)
CREATININE: 1.04 mg/dL (ref 0.50–1.35)
Calcium: 9.5 mg/dL (ref 8.4–10.5)
Chloride: 96 mEq/L (ref 96–112)
GFR calc Af Amer: >90 mL/min (ref >90)
GFR calc non Af Amer: 85 mL/min — ABNORMAL LOW (ref >90)
Glucose, Bld: 173 mg/dL — ABNORMAL HIGH (ref 70–99)
Potassium: 3.4 mEq/L — ABNORMAL LOW (ref 3.7–5.3)
Sodium: 139 mEq/L (ref 137–147)
TOTAL PROTEIN: 8.6 g/dL — AB (ref 6.0–8.3)

## 2014-06-12 LAB — RAPID URINE DRUG SCREEN, HOSP PERFORMED
Amphetamines: NOT DETECTED
BENZODIAZEPINES: NOT DETECTED
Barbiturates: NOT DETECTED
Cocaine: NOT DETECTED
OPIATES: NOT DETECTED
Tetrahydrocannabinol: NOT DETECTED

## 2014-06-12 LAB — CBC
HEMATOCRIT: 41.8 % (ref 39.0–52.0)
Hemoglobin: 14 g/dL (ref 13.0–17.0)
MCH: 28.2 pg (ref 26.0–34.0)
MCHC: 33.5 g/dL (ref 30.0–36.0)
MCV: 84.1 fL (ref 78.0–100.0)
PLATELETS: 343 10*3/uL (ref 150–400)
RBC: 4.97 MIL/uL (ref 4.22–5.81)
RDW: 14 % (ref 11.5–15.5)
WBC: 13.6 10*3/uL — ABNORMAL HIGH (ref 4.0–10.5)

## 2014-06-12 LAB — SALICYLATE LEVEL

## 2014-06-12 LAB — CBG MONITORING, ED: Glucose-Capillary: 152 mg/dL — ABNORMAL HIGH (ref 70–99)

## 2014-06-12 LAB — ACETAMINOPHEN LEVEL

## 2014-06-12 LAB — ETHANOL

## 2014-06-12 MED ORDER — CARVEDILOL 25 MG PO TABS
25.0000 mg | ORAL_TABLET | Freq: Two times a day (BID) | ORAL | Status: DC
  Administered 2014-06-12: 25 mg via ORAL
  Filled 2014-06-12 (×3): qty 1

## 2014-06-12 MED ORDER — HYDROCHLOROTHIAZIDE 25 MG PO TABS
25.0000 mg | ORAL_TABLET | Freq: Every day | ORAL | Status: DC
  Administered 2014-06-12: 25 mg via ORAL
  Filled 2014-06-12 (×2): qty 1

## 2014-06-12 MED ORDER — LOSARTAN POTASSIUM-HCTZ 100-25 MG PO TABS: 1.0000 | ORAL_TABLET | Freq: Every day | ORAL | Status: DC

## 2014-06-12 MED ORDER — AMLODIPINE BESYLATE 10 MG PO TABS
10.0000 mg | ORAL_TABLET | Freq: Every day | ORAL | Status: DC
  Administered 2014-06-12: 10 mg via ORAL
  Filled 2014-06-12 (×2): qty 1

## 2014-06-12 MED ORDER — DAPAGLIFLOZIN PROPANEDIOL 5 MG PO TABS: 5.0000 mg | ORAL_TABLET | Freq: Every day | ORAL | Status: DC

## 2014-06-12 MED ORDER — LOSARTAN POTASSIUM 50 MG PO TABS
100.0000 mg | ORAL_TABLET | Freq: Every day | ORAL | Status: DC
  Administered 2014-06-12: 100 mg via ORAL
  Filled 2014-06-12 (×2): qty 2
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
## 2014-06-12 NOTE — BH Assessment (Signed)
Relayed results of assessment to Donell SievertSpencer Simon, PA. Per Donell SievertSpencer Simon, PA pt should be seen by psychiatry in AM for final disposition recommendations.   EDP, Dr. Denton LankSteinl is in agreement with plan.  Informed Amie, RN who will share plan with pt. Per Amie pt was trying to encourage his wife to comply with directions by hospital staff earlier so he could get the help he needed.   Clista BernhardtNancy Ariannie Penaloza, [facility] Triage Specialist 06/12/2014 5:53 AM

## 2014-06-12 NOTE — ED Notes (Signed)
PT has shoes, cap, sweat pants, cellphone( [employer]) and t-shirt.  Pt family member took wallet home.  Pt has been seen and wanded by security.

## 2014-06-12 NOTE — ED Notes (Signed)
Explained plan of care to patient and wife. Provided BH patient general information sheet for point of reference. Patient confirms understanding of policy and is agreeable. Patient ambulated to restroom for change clothes and attempt to obtain urine sample.

## 2014-06-12 NOTE — Consult Note (Signed)
Face to face evaluation and I agree with this note 

## 2014-06-12 NOTE — BH Assessment (Addendum)
Reviewed EDP notes, and prior ED visit hx prior to initiating assessment. Pt presents to ED with depression with SI developing today. Pt reports hx of back injury proceeding depression. Labs pending.  Requested cart be placed with pt for assessment.   Assessment to begin shortly.   Clista BernhardtNancy Dashaun Onstott, [facility] T[facility] Specialist 06/12/2014 4:55 AM

## 2014-06-12 NOTE — ED Notes (Signed)
Bed: WTR5 Expected date:  Expected time:  Means of arrival:  Comments: Patient still in room
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
## 2014-06-12 NOTE — Discharge Instructions (Signed)
Adjustment Disorder Most changes in life can cause stress. Getting used to changes may take a few months or longer. If feelings of stress, hopelessness, or worry continue, you may have an adjustment disorder. This stress-related mental health problem may affect your feelings, thinking and how you act. It occurs in both sexes and happens at any age. SYMPTOMS  Some of the following problems may be seen and vary from person to person:  Sadness or depression.  Loss of enjoyment.  Thoughts of suicide.  Fighting.  Avoiding family and friends.  Poor school performance.  Hopelessness, sense of loss.  Trouble sleeping.  Vandalism.  Worry, weight loss or gain.  Crying spells.  Anxiety  Reckless driving.  Skipping school.  Poor work performance.  Nervousness.  Ignoring bills.  Poor attitude. DIAGNOSIS  Your caregiver will ask what has happened in your life and do a physical exam. They will make a diagnosis of an adjustment disorder when they are sure another problem or medical illness causing your feelings does not exist. TREATMENT  When problems caused by stress interfere with you daily life or last longer than a few months, you may need counseling for an adjustment disorder. Early treatment may diminish problems and help you to better cope with the stressful events in your life. Sometimes medication is necessary. Individual counseling and or support groups can be very helpful. PROGNOSIS  Adjustment disorders usually last less than 3 to 6 months. The condition may persist if there is long lasting stress. This could include health problems, relationship problems, or job difficulties where you can not easily escape from what is causing the problem. PREVENTION  Even the most mentally healthy, highly functioning people can suffer from an adjustment disorder given a significant blow from a life-changing event. There is no way to prevent pain and loss. Most people need help from time  to time. You are not alone. SEEK MEDICAL CARE IF:  Your feelings or symptoms listed above do not improve or worsen. Document Released: 04/06/2006 Document Revised: 10/25/2011 Document Reviewed: 06/28/2007 ExitCare Patient Information 2015 [employer]. This information is not intended to replace advice given to you by your health care provider. Make sure you discuss any questions you have with your health care provider. Depression Depression refers to feeling sad, low, down in the dumps, blue, gloomy, or empty. In general, there are two kinds of depression: 1. Normal sadness or normal grief. This kind of depression is one that we all feel from time to time after upsetting life experiences, such as the loss of a job or the ending of a relationship. This kind of depression is considered normal, is short lived, and resolves within a few days to 2 weeks. Depression experienced after the loss of a loved one (bereavement) often lasts longer than 2 weeks but normally gets better with time. 2. Clinical depression. This kind of depression lasts longer than normal sadness or normal grief or interferes with your ability to function at home, at work, and in school. It also interferes with your personal relationships. It affects almost every aspect of your life. Clinical depression is an illness. Symptoms of depression can also be caused by conditions other than those mentioned above, such as:  Physical illness. Some physical illnesses, including underactive thyroid gland (hypothyroidism), severe anemia, specific types of cancer, diabetes, uncontrolled seizures, heart and lung problems, strokes, and chronic pain are commonly associated with symptoms of depression.  Side effects of some prescription medicine. In some people, certain types of   medicine can cause symptoms of depression.  Substance abuse. Abuse of alcohol and illicit drugs can cause symptoms of depression. SYMPTOMS Symptoms of normal sadness and  normal grief include the following:  Feeling sad or crying for short periods of time.  Not caring about anything (apathy).  Difficulty sleeping or sleeping too much.  No longer able to enjoy the things you used to enjoy.  Desire to be by oneself all the time (social isolation).  Lack of energy or motivation.  Difficulty concentrating or remembering.  Change in appetite or weight.  Restlessness or agitation. Symptoms of clinical depression include the same symptoms of normal sadness or normal grief and also the following symptoms:  Feeling sad or crying all the time.  Feelings of guilt or worthlessness.  Feelings of hopelessness or helplessness.  Thoughts of suicide or the desire to harm yourself (suicidal ideation).  Loss of touch with reality (psychotic symptoms). Seeing or hearing things that are not real (hallucinations) or having false beliefs about your life or the people around you (delusions and paranoia). DIAGNOSIS  The diagnosis of clinical depression is usually based on how bad the symptoms are and how long they have lasted. Your health care provider will also ask you questions about your medical history and substance use to find out if physical illness, use of prescription medicine, or substance abuse is causing your depression. Your health care provider may also order blood tests. TREATMENT  Often, normal sadness and normal grief do not require treatment. However, sometimes antidepressant medicine is given for bereavement to ease the depressive symptoms until they resolve. The treatment for clinical depression depends on how bad the symptoms are but often includes antidepressant medicine, counseling with a mental health professional, or both. Your health care provider will help to determine what treatment is best for you. Depression caused by physical illness usually goes away with appropriate medical treatment of the illness. If prescription medicine is causing  depression, talk with your health care provider about stopping the medicine, decreasing the dose, or changing to another medicine. Depression caused by the abuse of alcohol or illicit drugs goes away when you stop using these substances. Some adults need professional help in order to stop drinking or using drugs. SEEK IMMEDIATE MEDICAL CARE IF:  You have thoughts about hurting yourself or others.  You lose touch with reality (have psychotic symptoms).  You are taking medicine for depression and have a serious side effect. FOR MORE INFORMATION  National Alliance on Mental Illness: www.nami.org  National Institute of Mental Health: www.nimh.nih.gov Document Released: 07/30/2000 Document Revised: 12/17/2013 Document Reviewed: 11/01/2011 ExitCare Patient Information 2015 [employer]. This information is not intended to replace advice given to you by your health care provider. Make sure you discuss any questions you have with your health care provider.  

## 2014-06-12 NOTE — ED Notes (Signed)
Bed: WA29 Expected date:  Expected time:  Means of arrival:  Comments: Tr5

## 2014-06-12 NOTE — ED Notes (Signed)
Dr. Steinl at bedside 

## 2014-06-12 NOTE — ED Notes (Signed)
Tele assessment machine placed at bedside.

## 2014-06-12 NOTE — [facility] Suicide Risk Assessment (Cosign Needed)
Suicide Risk Assessment  Discharge Assessment     Demographic Factors:  Male  Total Time spent with patient: 30 minutes  Psychiatric Specialty Exam:     Blood pressure 152/82, pulse 86, temperature 98.4 F (36.9 C), temperature source Oral, resp. rate 20, height 5\' 11"  (1.803 m), weight 160.573 kg (354 lb), SpO2 94.00%.Body mass index is 49.39 kg/(m^2).   General Appearance: Casual and Fairly Groomed   Patent attorneyye Contact:: Good   Speech: Clear and Coherent and Normal Rate   Volume: Normal   Mood: Depressed   Affect: Congruent   Thought Process: Circumstantial, Coherent and Goal Directed   Orientation: Full (Time, Place, and Person)   Thought Content: WDL   Suicidal Thoughts: No   Homicidal Thoughts: No   Memory: Immediate; Good  Recent; Good  Remote; Good   Judgement: Intact   Insight: Present   Psychomotor Activity: Normal   Concentration: Good   Recall: Good   Fund of Knowledge:Good   Language: Good   Akathisia: No   Handed: Right   AIMS (if indicated):   Assets: Communication Skills  Desire for Improvement  Housing  Social Support  Transportation   Sleep:   Musculoskeletal:  Strength & Muscle Tone: within normal limits  Gait & Station: Did not see patient ambulate  Patient leans: N/A    Mental Status Per Nursing Assessment::   On Admission:     Current Mental Status by Physician: Patient denies suicidal/homicidal ideation, psychosis, and paranoia  Loss Factors: NA  Historical Factors: NA  Risk Reduction Factors:   Sense of responsibility to family, Religious beliefs about death, Living with another person, especially a relative and Positive social support  Continued Clinical Symptoms:  Depression  Cognitive Features That Contribute To Risk:  None noted    Suicide Risk:  Minimal: No identifiable suicidal ideation.  Patients presenting with no risk factors but with morbid ruminations; may be classified as minimal risk based on the severity of the  depressive symptoms  Discharge Diagnoses:  AXIS I: Adjustment Disorder with Depressed Mood  AXIS II: Deferred  AXIS III:  Past Medical History   Diagnosis  Date   .  Hypertension    .  Diabetes mellitus    .  Abdominal lymphadenopathy  06/21/2012     Borderline abdominal/pelvic adenopathy seen incidentally on CT scan 2011. No change follow up 10/13   .  Leukocytosis  06/21/2012     High normal @ 10,000 41 poly, 46% lymphs stable over time 6// to 10/13 no anemia or thrombocytopenia   .  Left lumbar radiculopathy  06/21/2012   .  DM2 (diabetes mellitus, type 2)  06/21/2012   .  HTN (hypertension), benign  06/21/2012   .  Migraine    .  Obesity     AXIS IV: economic problems, other psychosocial or environmental problems and problems with access to health care services  AXIS V: 61-70 mild symptoms     Plan Of Care/Follow-up recommendations:  Activity:  as tolerated Diet:  as tolerated  Is patient on multiple antipsychotic therapies at discharge:  No   Has Patient had three or more failed trials of antipsychotic monotherapy by history:  No  Recommended Plan for Multiple Antipsychotic Therapies: NA    Rankin, Shuvon, FNP-BC 06/12/2014, 1:12 PM

## 2014-06-12 NOTE — BH Assessment (Signed)
Tele Assessment Note   Travis Powell is an 43 y.o. male presenting to ED this morning due to worsening depression and suicidal ideation. Pt reports this started at around 12 pm 06-11-14 and he could not sleep. Pt asked his wife to bring him to ED as he did not feel safe. Pt reports while waiting in triage he opened the door that had been closed because he did not feel safe being alone. Pt reports today was the worst he has felt and the first time, he could no pray and talk himself into a better frame of mind. Pt reports he was hurt at work in 2010 and has been unable to work due to back pain. Pt reports pain typically is 7 out of 10 and is 6 out of 10 today. Pt reports he takes opiate pain medication, but tries not to take it everyday. Pt reports today was particularly hard for him because he went to apply for government assistance, and "the reality" of his situation hit him. He reports he feels upset that he can not provide for his family, and worries what he will do if his disability is denied. He reports he has a 10th grade education and trouble with reading as he has dyslexia. Pt is alert and oriented time 4, mood is anxious and depressed with congruent affect. Speech is slow, low, logical, and coherent. Pt denies SI, HI, self-harm, and SA. Reports visual hallucinations at night with medications at times, seeing bugs. Pt reports he has had SI with planning but no past attempts. Pt reports he has thought of jumping out the window, overdosing on medications, and has been to pawn shops looking at guns. Pt denies current plan or intent. When inpt was discussed pt stated he felt he could contract for safety, and noted feeling nervous about being "locked up." He started rocking back and forth when inpt was discussed. Pt reports he came to ED because he felt unsafe.   Pt reports depression for the past 3 years worsening today. "I have never felt this bad." Pt reports loss of pleasure, SI, hopelessness, and trying  to hold in negative feelings, irritability and labile moods. Pt reports he frequently worries about how things will turn out. Pt reports panic attacks 2-3 times per week triggered by "thinking too much." Pt denies hx of abuse or trauma, outside of work accident. Pt denies hx of MH sx prior to accident.   Pt denies substance abuse. Family hx positive for suicide, cousin.       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 Hypertension   . Diabetes mellitus   . Abdominal lymphadenopathy 06/21/2012    Borderline abdominal/pelvic adenopathy seen incidentally on CT scan 2011.  No change follow up 10/13  . Leukocytosis 06/21/2012    High normal @ 10,000  41 poly, 46% lymphs stable over time 6// to 10/13 no anemia or thrombocytopenia  . Left lumbar radiculopathy 06/21/2012  . DM2 (diabetes mellitus, type 2) 06/21/2012  . HTN (hypertension), benign 06/21/2012  . Migraine   . Obesity    Axis IV: economic problems, educational problems, occupational problems, other psychosocial or environmental problems and problems with access to health care services Axis V: 31-40  Past Medical History:  Past Medical History  Diagnosis Date  . Hypertension   . Diabetes mellitus   . Abdominal lymphadenopathy  06/21/2012    Borderline abdominal/pelvic adenopathy seen incidentally on CT scan 2011.  No change follow up 10/13  . Leukocytosis 06/21/2012    High normal @ 10,000  41 poly, 46% lymphs stable over time 6// to 10/13 no anemia or thrombocytopenia  . Left lumbar radiculopathy 06/21/2012  . DM2 (diabetes mellitus, type 2) 06/21/2012  . HTN (hypertension), benign 06/21/2012  . Migraine   . Obesity     Past Surgical History  Procedure Laterality Date  . Carpal tunnel release      Family History: History reviewed. No pertinent family history.  Social History:  reports  that he has never smoked. He does not have any smokeless tobacco history on file. He reports that he does not drink alcohol or use illicit drugs.  Additional Social History:  Alcohol / Drug Use Pain Medications: SEE MAR, reports does not take as often as prescribed, did not test positive for opiates Prescriptions: SEE MAR, reports takes 11 medications Over the Counter: SEE MAR History of alcohol / drug use?: No history of alcohol / drug abuse (none) Longest period of sobriety (when/how long):  (N/A) Negative Consequences of Use:  (N/A) Withdrawal Symptoms:  (no sx owithdrawal noted at this time)  CIWA: CIWA-Ar BP: 167/111 mmHg Pulse Rate: 86 COWS:    PATIENT STRENGTHS: (choose at least two) Communication skills Motivation for treatment/growth  Allergies: No Known Allergies  Home Medications:  (Not in a hospital admission)  OB/GYN Status:  No LMP for male patient.  General Assessment Data Location of Assessment: [facility] Is this a Tele or Face-to-Face Assessment?: Tele Assessment Is this an Initial Assessment or a Re-assessment for this encounter?: Initial Assessment Living Arrangements: Spouse/significant other;Children Can pt return to current living arrangement?: Yes Admission Status: Voluntary Is patient capable of signing voluntary admission?: Yes Transfer from: Home Referral Source: Self/Family/Friend     [facility] Living Arrangements: Spouse/significant other;Children Name of Psychiatrist[name] Name of Therapist: none  Education Status Is patient currently in school?: No Current Grade: NA Highest grade of school patient has completed: 10 Name of school: NA Contact person: NA  Risk to self with the past 6 months Suicidal Ideation: Yes-Currently Present Suicidal Intent: No-Not Currently/Within Last 6 Months Is patient at risk for suicide?: Yes Suicidal Plan?: No-Not Currently/Within Last 6 Months Access to  Means: Yes Specify Access to Suicidal Means: medication, window, went to pawn shops to look at guns What has been your use of drugs/alcohol within the last 12 months?: none Previous Attempts/Gestures: No How many times?: 0 Other Self Harm Risks: none Triggers for Past Attempts: None known Intentional Self Injurious Behavior: None Family Suicide History: Yes (cousin) Recent stressful life event(s): Financial Problems;Legal Issues;Other (Comment) (work injury, out of work, trying to get disability ) Persecutory voices/beliefs?: No Depression: Yes Depression Symptoms: Despondent;Insomnia;Tearfulness;Fatigue;Isolating;Guilt;Feeling worthless/self pity;Loss of interest in usual pleasures;Feeling angry/irritable (SI, SI with planning at times) Substance abuse history and/or treatment for substance abuse?: No Suicide prevention information given to non-admitted patients: Yes  Risk to Others within the past 6 months Homicidal Ideation: No Thoughts of Harm to Others: No Current Homicidal Intent: No Current Homicidal Plan: No Access to Homicidal Means: No Identified Victim: none History of harm to others?: No Assessment of Violence: None Noted Violent Behavior Description: none Does patient have access to weapons?: No Criminal Charges Pending?: No Does patient have a court date: No  Psychosis Hallucinations: Visual (sees bugs at night with medications)  Delusions: None noted  Mental Status Report Appear/Hygiene: Unremarkable;In hospital gown Eye Contact: Good Motor Activity:  (began rocking when discussing possible inpt ) Speech: Logical/coherent;Soft;Slow Level of Consciousness: Alert Mood: Depressed;Anxious Affect: Appropriate to circumstance Anxiety Level: Panic Attacks Panic attack frequency: 2-3 per week Most recent panic attack: today  Thought Processes: Relevant;Coherent Judgement: Unimpaired Orientation: Person;Place;Time;Situation Obsessive Compulsive Thoughts/Behaviors:  None  Cognitive Functioning Concentration: Decreased Memory: Recent Intact;Remote Intact IQ: Average Insight: Fair Impulse Control: Fair Appetite: Poor Weight Loss:  (unsure) Weight Gain: 60 (since out of work ) Sleep: Decreased Total Hours of Sleep: 3 (broken sleep) Vegetative Symptoms: Staying in bed  ADLScreening [facility] Assessment Services) Patient's cognitive ability adequate to safely complete daily activities?: Yes Patient able to express need for assistance with ADLs?: Yes Independently performs ADLs?: Yes (appropriate for developmental age)  Prior Inpatient Therapy Prior Inpatient Therapy: No Prior Therapy Dates: NA Prior Therapy Facilty/Provider(s): NA Reason for Treatment: NA  Prior Outpatient Therapy Prior Outpatient Therapy: Yes Prior Therapy Dates: current Prior Therapy Facilty/Provider(s): PCP Dr. Jamelle HaringSnow and then Dr. [name]  Reason for Treatment: depression, pain mediction management  ADL Screening (condition at time of admission) Patient's cognitive ability adequate to safely complete daily activities?: Yes Is the patient deaf or have difficulty hearing?: No Does the patient have difficulty seeing, even when wearing glasses/contacts?: No Does the patient have difficulty concentrating, remembering, or making decisions?: No Patient able to express need for assistance with ADLs?: Yes Does the patient have difficulty dressing or bathing?: No Independently performs ADLs?: Yes (appropriate for developmental age) Does the patient have difficulty walking or climbing stairs?: Yes (back pain from 2010 work injury) Weakness of Legs: None Weakness of Arms/Hands: None  Home Assistive Devices/Equipment Home Assistive Devices/Equipment:  (reports uses cane and crutches at times but these are not prescibed )    Abuse/Neglect Assessment (Assessment to be complete while patient is alone) Physical Abuse: Denies Verbal Abuse: Denies Sexual Abuse: Denies Exploitation of  patient/patient's resources: Denies Self-Neglect: Denies Values / Beliefs Cultural Requests During Hospitalization: None Spiritual Requests During Hospitalization: None   Advance Directives (For Healthcare) Does patient have an advance directive?: No Would patient like information on creating an advanced directive?: No - patient declined information Nutrition Screen- [facility] Patient's home diet: Other (Comment) (modified carb diet)  Additional Information 1:1 In Past 12 Months?: No CIRT Risk: No Elopement Risk: No Does patient have medical clearance?: Yes     Disposition:  Pt to be seen by psychiatry in AM for final disposition recommendations per Donell SievertSpencer Simon, PA. EDP, Dr. Denton LankSteinl in agreement with plan.    Clista BernhardtNancy Jennalynn Rivard, [facility] Triage Specialist 06/12/2014 6:10 AM

## 2014-06-12 NOTE — ED Notes (Signed)
Denies SI/HI

## 2014-06-18 ENCOUNTER — Encounter (HOSPITAL_COMMUNITY): Payer: Self-pay | Admitting: Emergency Medicine

## 2014-06-18 ENCOUNTER — Emergency Department (HOSPITAL_COMMUNITY): Payer: BC Managed Care – PPO

## 2014-06-18 ENCOUNTER — Observation Stay (HOSPITAL_COMMUNITY)
Admission: EM | Admit: 2014-06-18 | Discharge: 2014-06-18 | Disposition: A | Discharge status: Home / Self Care | Payer: BC Managed Care – PPO | Attending: Internal Medicine | Admitting: Internal Medicine

## 2014-06-18 DIAGNOSIS — D72829 Elevated white blood cell count, unspecified: Secondary | ICD-10-CM | POA: Insufficient documentation

## 2014-06-18 DIAGNOSIS — R002 Palpitations: Secondary | ICD-10-CM | POA: Insufficient documentation

## 2014-06-18 DIAGNOSIS — G4733 Obstructive sleep apnea (adult) (pediatric): Secondary | ICD-10-CM | POA: Diagnosis present

## 2014-06-18 DIAGNOSIS — E669 Obesity, unspecified: Secondary | ICD-10-CM | POA: Diagnosis not present

## 2014-06-18 DIAGNOSIS — E119 Type 2 diabetes mellitus without complications: Secondary | ICD-10-CM

## 2014-06-18 DIAGNOSIS — R61 Generalized hyperhidrosis: Secondary | ICD-10-CM | POA: Insufficient documentation

## 2014-06-18 DIAGNOSIS — R079 Chest pain, unspecified: Principal | ICD-10-CM | POA: Diagnosis present

## 2014-06-18 DIAGNOSIS — Z79899 Other long term (current) drug therapy: Secondary | ICD-10-CM | POA: Insufficient documentation

## 2014-06-18 DIAGNOSIS — E1165 Type 2 diabetes mellitus with hyperglycemia: Secondary | ICD-10-CM

## 2014-06-18 DIAGNOSIS — R110 Nausea: Secondary | ICD-10-CM | POA: Insufficient documentation

## 2014-06-18 DIAGNOSIS — E785 Hyperlipidemia, unspecified: Secondary | ICD-10-CM

## 2014-06-18 DIAGNOSIS — G43909 Migraine, unspecified, not intractable, without status migrainosus: Secondary | ICD-10-CM | POA: Diagnosis not present

## 2014-06-18 DIAGNOSIS — M5416 Radiculopathy, lumbar region: Secondary | ICD-10-CM | POA: Insufficient documentation

## 2014-06-18 DIAGNOSIS — R072 Precordial pain: Secondary | ICD-10-CM

## 2014-06-18 DIAGNOSIS — I10 Essential (primary) hypertension: Secondary | ICD-10-CM | POA: Diagnosis not present

## 2014-06-18 LAB — COMPREHENSIVE METABOLIC PANEL
ALBUMIN: 3.5 g/dL (ref 3.5–5.2)
ALK PHOS: 77 U/L (ref 39–117)
ALT: 23 U/L (ref 0–53)
AST: 26 U/L (ref 0–37)
Anion gap: 17 — ABNORMAL HIGH (ref 5–15)
BUN: 11 mg/dL (ref 6–23)
CHLORIDE: 98 meq/L (ref 96–112)
CO2: 23 mEq/L (ref 19–32)
Calcium: 9.4 mg/dL (ref 8.4–10.5)
Creatinine, Ser: 0.82 mg/dL (ref 0.50–1.35)
GFR calc Af Amer: >90 mL/min (ref >90)
GFR calc non Af Amer: >90 mL/min (ref >90)
Glucose, Bld: 186 mg/dL — ABNORMAL HIGH (ref 70–99)
POTASSIUM: 3.4 meq/L — AB (ref 3.7–5.3)
Sodium: 138 mEq/L (ref 137–147)
Total Bilirubin: 0.3 mg/dL (ref 0.3–1.2)
Total Protein: 7.7 g/dL (ref 6.0–8.3)

## 2014-06-18 LAB — TROPONIN I: Troponin I: <0.3 ng/mL (ref <0.30)

## 2014-06-18 LAB — GLUCOSE, CAPILLARY
Glucose-Capillary: 215 mg/dL — ABNORMAL HIGH (ref 70–99)
Glucose-Capillary: 164 mg/dL — ABNORMAL HIGH (ref 70–99)
Glucose-Capillary: 186 mg/dL — ABNORMAL HIGH (ref 70–99)

## 2014-06-18 LAB — BASIC METABOLIC PANEL
Anion gap: 19 — ABNORMAL HIGH (ref 5–15)
BUN: 13 mg/dL (ref 6–23)
CHLORIDE: 95 meq/L — AB (ref 96–112)
CO2: 24 mEq/L (ref 19–32)
Calcium: 9.7 mg/dL (ref 8.4–10.5)
Creatinine, Ser: 0.93 mg/dL (ref 0.50–1.35)
GFR calc Af Amer: >90 mL/min (ref >90)
GFR calc non Af Amer: >90 mL/min (ref >90)
GLUCOSE: 171 mg/dL — AB (ref 70–99)
POTASSIUM: 3.3 meq/L — AB (ref 3.7–5.3)
SODIUM: 138 meq/L (ref 137–147)

## 2014-06-18 LAB — CBC
HCT: 43.7 % (ref 39.0–52.0)
Hemoglobin: 14.5 g/dL (ref 13.0–17.0)
MCH: 28.1 pg (ref 26.0–34.0)
MCHC: 33.2 g/dL (ref 30.0–36.0)
MCV: 84.7 fL (ref 78.0–100.0)
Platelets: 276 10*3/uL (ref 150–400)
RBC: 5.16 MIL/uL (ref 4.22–5.81)
RDW: 13.9 % (ref 11.5–15.5)
WBC: 11.6 10*3/uL — ABNORMAL HIGH (ref 4.0–10.5)

## 2014-06-18 LAB — CBC WITH DIFFERENTIAL/PLATELET
BASOS ABS: 0 10*3/uL (ref 0.0–0.1)
BASOS PCT: 0 % (ref 0–1)
Eosinophils Absolute: 0.1 10*3/uL (ref 0.0–0.7)
Eosinophils Relative: 1 % (ref 0–5)
HEMATOCRIT: 40.6 % (ref 39.0–52.0)
Hemoglobin: 13.7 g/dL (ref 13.0–17.0)
Lymphocytes Relative: 38 % (ref 12–46)
Lymphs Abs: 4.3 10*3/uL — ABNORMAL HIGH (ref 0.7–4.0)
MCH: 28.5 pg (ref 26.0–34.0)
MCHC: 33.7 g/dL (ref 30.0–36.0)
MCV: 84.6 fL (ref 78.0–100.0)
MONO ABS: 0.7 10*3/uL (ref 0.1–1.0)
MONOS PCT: 6 % (ref 3–12)
Neutro Abs: 6.1 10*3/uL (ref 1.7–7.7)
Neutrophils Relative %: 55 % (ref 43–77)
Platelets: 325 10*3/uL (ref 150–400)
RBC: 4.8 MIL/uL (ref 4.22–5.81)
RDW: 13.9 % (ref 11.5–15.5)
WBC: 11.2 10*3/uL — ABNORMAL HIGH (ref 4.0–10.5)

## 2014-06-18 LAB — RAPID URINE DRUG SCREEN, HOSP PERFORMED
Amphetamines: NOT DETECTED
BARBITURATES: NOT DETECTED
BENZODIAZEPINES: NOT DETECTED
Cocaine: NOT DETECTED
OPIATES: NOT DETECTED
TETRAHYDROCANNABINOL: NOT DETECTED

## 2014-06-18 LAB — I-STAT TROPONIN, ED: Troponin i, poc: 0.03 ng/mL (ref 0.00–0.08)

## 2014-06-18 LAB — TSH: TSH: 2.37 u[IU]/mL (ref 0.350–4.500)

## 2014-06-18 LAB — D-DIMER, QUANTITATIVE: D-Dimer, Quant: <0.27 ug/mL-FEU (ref 0.00–0.48)

## 2014-06-18 MED ORDER — NITROGLYCERIN 2 % TD OINT
1.0000 [in_us] | TOPICAL_OINTMENT | Freq: Once | TRANSDERMAL | Status: AC
  Administered 2014-06-18: 1 [in_us] via TOPICAL
  Filled 2014-06-18: qty 1

## 2014-06-18 MED ORDER — TOPIRAMATE 25 MG PO TABS
50.0000 mg | ORAL_TABLET | Freq: Every day | ORAL | Status: DC
  Filled 2014-06-18: qty 2

## 2014-06-18 MED ORDER — LOSARTAN POTASSIUM-HCTZ 100-25 MG PO TABS: 1.0000 | ORAL_TABLET | Freq: Every day | ORAL | Status: DC

## 2014-06-18 MED ORDER — PNEUMOCOCCAL VAC POLYVALENT 25 MCG/0.5ML IJ INJ
0.5000 mL | INJECTION | Freq: Once | INTRAMUSCULAR | Status: AC
  Administered 2014-06-18: 0.5 mL via INTRAMUSCULAR
  Filled 2014-06-18: qty 0.5

## 2014-06-18 MED ORDER — DAPAGLIFLOZIN PROPANEDIOL 5 MG PO TABS: 5.0000 mg | ORAL_TABLET | Freq: Every day | ORAL | Status: DC

## 2014-06-18 MED ORDER — NITROGLYCERIN 0.4 MG SL SUBL
0.4000 mg | SUBLINGUAL_TABLET | SUBLINGUAL | Status: DC | PRN
  Administered 2014-06-18: 0.4 mg via SUBLINGUAL
  Filled 2014-06-18: qty 1

## 2014-06-18 MED ORDER — HYDROCODONE-ACETAMINOPHEN 5-325 MG PO TABS: 1.0000 | ORAL_TABLET | Freq: Four times a day (QID) | ORAL | Status: DC | PRN

## 2014-06-18 MED ORDER — ONDANSETRON HCL 4 MG PO TABS: 4.0000 mg | ORAL_TABLET | Freq: Four times a day (QID) | ORAL | Status: DC | PRN

## 2014-06-18 MED ORDER — ONDANSETRON HCL 4 MG/2ML IJ SOLN: 4.0000 mg | Freq: Four times a day (QID) | INTRAMUSCULAR | Status: DC | PRN

## 2014-06-18 MED ORDER — CARVEDILOL 25 MG PO TABS
25.0000 mg | ORAL_TABLET | Freq: Two times a day (BID) | ORAL | Status: DC
  Administered 2014-06-18: 25 mg via ORAL
  Filled 2014-06-18: qty 1

## 2014-06-18 MED ORDER — VITAMIN D (ERGOCALCIFEROL) 1.25 MG (50000 UNIT) PO CAPS: 50000.0000 [IU] | ORAL_CAPSULE | ORAL | Status: DC

## 2014-06-18 MED ORDER — ENOXAPARIN SODIUM 80 MG/0.8ML ~~LOC~~ SOLN
80.0000 mg | SUBCUTANEOUS | Status: DC
  Administered 2014-06-18: 80 mg via SUBCUTANEOUS
  Filled 2014-06-18: qty 0.8

## 2014-06-18 MED ORDER — HYDRALAZINE HCL 20 MG/ML IJ SOLN: 10.0000 mg | INTRAMUSCULAR | Status: DC | PRN

## 2014-06-18 MED ORDER — ASPIRIN 325 MG PO TBEC: 325.0000 mg | DELAYED_RELEASE_TABLET | Freq: Every day | ORAL | Status: DC

## 2014-06-18 MED ORDER — HYDROCHLOROTHIAZIDE 25 MG PO TABS
25.0000 mg | ORAL_TABLET | Freq: Every day | ORAL | Status: DC
  Administered 2014-06-18: 25 mg via ORAL
  Filled 2014-06-18: qty 1

## 2014-06-18 MED ORDER — POTASSIUM CHLORIDE CRYS ER 20 MEQ PO TBCR
20.0000 meq | EXTENDED_RELEASE_TABLET | Freq: Once | ORAL | Status: AC
  Administered 2014-06-18: 20 meq via ORAL
  Filled 2014-06-18: qty 1

## 2014-06-18 MED ORDER — INSULIN ASPART 100 UNIT/ML ~~LOC~~ SOLN
0.0000 [IU] | Freq: Three times a day (TID) | SUBCUTANEOUS | Status: DC
  Administered 2014-06-18 (×2): 2 [IU] via SUBCUTANEOUS

## 2014-06-18 MED ORDER — SODIUM CHLORIDE 0.9 % IJ SOLN
3.0000 mL | Freq: Two times a day (BID) | INTRAMUSCULAR | Status: DC
  Administered 2014-06-18: 3 mL via INTRAVENOUS

## 2014-06-18 MED ORDER — SODIUM CHLORIDE 0.9 % IJ SOLN: 3.0000 mL | Freq: Two times a day (BID) | INTRAMUSCULAR | Status: DC

## 2014-06-18 MED ORDER — LOSARTAN POTASSIUM 50 MG PO TABS
100.0000 mg | ORAL_TABLET | Freq: Every day | ORAL | Status: DC
  Administered 2014-06-18: 100 mg via ORAL
  Filled 2014-06-18: qty 2

## 2014-06-18 MED ORDER — MORPHINE SULFATE 2 MG/ML IJ SOLN: 1.0000 mg | INTRAMUSCULAR | Status: DC | PRN

## 2014-06-18 MED ORDER — POTASSIUM CHLORIDE ER 20 MEQ PO TBCR: 20.0000 meq | EXTENDED_RELEASE_TABLET | Freq: Every day | ORAL | Status: DC

## 2014-06-18 MED ORDER — NITROGLYCERIN 0.4 MG/HR TD PT24
0.4000 mg | MEDICATED_PATCH | Freq: Every day | TRANSDERMAL | Status: DC
  Administered 2014-06-18: 0.4 mg via TRANSDERMAL
  Filled 2014-06-18: qty 1

## 2014-06-18 MED ORDER — BUPROPION HCL ER (XL) 150 MG PO TB24
150.0000 mg | ORAL_TABLET | Freq: Every day | ORAL | Status: DC
  Administered 2014-06-18: 150 mg via ORAL
  Filled 2014-06-18: qty 1

## 2014-06-18 MED ORDER — AMLODIPINE BESYLATE 10 MG PO TABS
10.0000 mg | ORAL_TABLET | Freq: Every day | ORAL | Status: DC
  Administered 2014-06-18: 10 mg via ORAL
  Filled 2014-06-18: qty 1

## 2014-06-18 MED ORDER — ASPIRIN EC 325 MG PO TBEC
325.0000 mg | DELAYED_RELEASE_TABLET | Freq: Every day | ORAL | Status: DC
  Administered 2014-06-18: 325 mg via ORAL
  Filled 2014-06-18: qty 1

## 2014-06-18 MED ORDER — ATORVASTATIN CALCIUM 20 MG PO TABS: 20.0000 mg | ORAL_TABLET | Freq: Every day | ORAL | Status: DC

## 2014-06-18 MED ORDER — ACETAMINOPHEN 325 MG PO TABS: 650.0000 mg | ORAL_TABLET | Freq: Four times a day (QID) | ORAL | Status: DC | PRN

## 2014-06-18 MED ORDER — ACETAMINOPHEN 650 MG RE SUPP
650.0000 mg | Freq: Four times a day (QID) | RECTAL | Status: DC | PRN
Start: 2014-06-18 — End: 2014-06-18

## 2014-06-18 NOTE — Consult Note (Signed)
CARDIOLOGY CONSULT NOTE       Patient ID: Travis FreestoneCraig A Powell MRN: [id number] DOB/AGE: 01/31/1971 43 y.o.  Admit date: 06/18/2014 Referring Physician:  Susie CassetteAbrol Primary Physician: PROVIDER NOT IN SYSTEM Primary Cardiologist:  Edson SnowballSmith [name] Reason for Consultation:  Chest pain  Principal Problem:   Chest pain Active Problems:   DM2 (diabetes mellitus, type 2)   Accelerated hypertension   OSA (obstructive sleep apnea)   Hyperlipidemia   HPI:   Obese 43 yo black male admitted with atypical chest pain.  Has been Rx for HTN since age 89.  Noncompliant with meds.  Poor diet , sedentary from chronic back pain and obese.  Denies ETOH or excess NSAI use.  He sees Dr [name] at [facility] and indicates normal chemical myovue and echo in July with just LVH.  Patient started experiencing chest pain last midnight with pain around the retrosternal area severe crushing in nature associated with diaphoresis and mild shortness of breath. Pain lasted for around 20 minutes and resolved spontaneously. Patient was brought to the ER and was found to have blood pressure more than 200 systolic.  Pain improved with BP control  No pain this am  R/O and no acute ECG changes   ROS All other systems reviewed and negative except as noted above  Past Medical History  Diagnosis Date  . Hypertension   . Diabetes mellitus   . Abdominal lymphadenopathy 06/21/2012    Borderline abdominal/pelvic adenopathy seen incidentally on CT scan 2011.  No change follow up 10/13  . Leukocytosis 06/21/2012    High normal @ 10,000  41 poly, 46% lymphs stable over time 6// to 10/13 no anemia or thrombocytopenia  . Left lumbar radiculopathy 06/21/2012  . DM2 (diabetes mellitus, type 2) 06/21/2012  . HTN (hypertension), benign 06/21/2012  . Migraine   . Obesity     Family History  Problem Relation Age of Onset  . CAD Other   . Stroke Other     History   Social History  . Marital Status: Married    Spouse Name: N/A    Number of  Children: N/A  . Years of Education: N/A   Occupational History  . Not on file.   Social History Main Topics  . Smoking status: Never Smoker   . Smokeless tobacco: Not on file  . Alcohol Use: No  . Drug Use: No  . Sexual Activity: Yes   Other Topics Concern  . Not on file   Social History Narrative    Past Surgical History  Procedure Laterality Date  . Carpal tunnel release       . amLODipine  10 mg Oral Daily  . aspirin EC  325 mg Oral Daily  . atorvastatin  20 mg Oral QHS  . buPROPion  150 mg Oral Daily  . carvedilol  25 mg Oral BID WC  . dapagliflozin propanediol  5 mg Oral Daily  . enoxaparin (LOVENOX) injection  80 mg Subcutaneous Q24H  . hydrochlorothiazide  25 mg Oral Daily  . insulin aspart  0-9 Units Subcutaneous TID WC  . losartan  100 mg Oral Daily  . nitroGLYCERIN  0.4 mg Transdermal Daily  . pneumococcal 23 valent vaccine  0.5 mL Intramuscular Once  . sodium chloride  3 mL Intravenous Q12H  . sodium chloride  3 mL Intravenous Q12H  . topiramate  50 mg Oral QHS  . [START ON 06/24/2014] Vitamin D (Ergocalciferol)  50,000 Units Oral Q Mon      Physical  Exam: Blood pressure 167/93, pulse 89, temperature 98.6 F (37 C), temperature source Oral, resp. rate 20, height 6' (1.829 m), weight 160.347 kg (353 lb 8 oz), SpO2 98 %.    Affect appropriate Obese black male  HEENT: normal Neck supple with no adenopathy JVP normal no bruits no thyromegaly Lungs clear with no wheezing and good diaphragmatic motion Heart:  S1/S2 no murmur, no rub, gallop or click PMI normal Abdomen: benighn, BS positve, no tenderness, no AAA no bruit.  No HSM or HJR Distal pulses intact with no bruits No edema Neuro non-focal Skin warm and dry No muscular weakness   Labs:   Lab Results  Component Value Date   WBC 11.2* 06/18/2014   HGB 13.7 06/18/2014   HCT 40.6 06/18/2014   MCV 84.6 06/18/2014   PLT 325 06/18/2014    Recent Labs Lab 06/18/14 0550  NA 138  K 3.4*    CL 98  CO2 23  BUN 11  CREATININE 0.82  CALCIUM 9.4  PROT 7.7  BILITOT 0.3  ALKPHOS 77  ALT 23  AST 26  GLUCOSE 186*   Lab Results  Component Value Date   TROPONINI <0.30 06/18/2014   No results found for: CHOL No results found for: HDL No results found for: LDLCALC No results found for: TRIG No results found for: CHOLHDL No results found for: LDLDIRECT    Radiology: Dg Chest Port 1 View  06/18/2014   CLINICAL DATA:  The chest pain and shortness of breath tonight.  EXAM: PORTABLE CHEST - 1 VIEW  COMPARISON:  12/16/2011  FINDINGS: Left lower chest is off the field of view. Shallow inspiration. Normal heart size and pulmonary vascularity. No focal airspace disease or consolidation in the lungs. No blunting of costophrenic angles. No pneumothorax. Mediastinal contours appear intact.  IMPRESSION: No active disease.   Electronically Signed   By: Burman NievesWilliam  Stevens M.D.   On: 06/18/2014 02:20    EKG:  SR/ST  Q lead 3 normal no acute ST changes    ASSESSMENT AND PLAN:  Chest Pain:  Atypical no ECG changes r/o  Likely related to HTN urgency  Benign stress [facility] and [facility] July [facility] No need for further inpatient w/u  [facility] Dr [name] HTN:  Discussed importance of compliance.  Also low sodium diet, more exercise and weight loss.  Continue current meds.  Discussed risk of stroke and renal failure long term DM:  Check A1c  On farxiga    Signed: [name] 06/18/2014, 9:00 AM

## 2014-06-18 NOTE — Progress Notes (Signed)
UR completed 

## 2014-06-18 NOTE — ED Notes (Signed)
Per EMS, the patient is from home with sudden onset of chest pain, throbbing central.  12 lead unremarkable. He reports not taking his BP meds today.  Hx of depression and is not consistantly taking medications. Nausous on arrival to room A12.  He had a period of anxiety while EMS was on site, kicked his table, and was later calmed down prior to transport.  324 aspirin given enroute. BP 186/110, P 107, 98% room air, CBG 166.  Patient was sinus tach 115-120s initially with EMS.

## 2014-06-18 NOTE — Discharge Summary (Signed)
Physician Discharge Summary  Travis Powell MRN: [id number] DOB/AGE: September 01, 1970 43 y.o.  PCP: PROVIDER NOT IN SYSTEM   Admit date: 06/18/2014 Discharge date: 06/18/2014  Discharge Diagnoses:      Chest pain Active Problems:   DM2 (diabetes mellitus, type 2)   Accelerated hypertension   OSA (obstructive sleep apnea)   Hyperlipidemia    follow-up recommendations Follow-up with PCP at [facility]     Medication List    TAKE these medications        amLODipine 10 MG tablet  Commonly known as:  NORVASC  Take 10 mg by mouth daily.     aspirin 325 MG EC tablet  Take 1 tablet (325 mg total) by mouth daily.     atorvastatin 20 MG tablet  Commonly known as:  LIPITOR  Take 20 mg by mouth at bedtime.     buPROPion 150 MG 24 hr tablet  Commonly known as:  WELLBUTRIN XL  Take 150 mg by mouth daily.     carvedilol 25 MG tablet  Commonly known as:  COREG  Take 1 tablet (25 mg total) by mouth 2 (two) times daily with a meal.     FARXIGA 5 MG Tabs tablet  Generic drug:  dapagliflozin propanediol  Take 5 mg by mouth daily.     HYDROcodone-acetaminophen 5-325 MG per tablet  Commonly known as:  NORCO/VICODIN  Take 1 tablet by mouth every 6 (six) hours as needed for moderate pain. Pain     losartan-hydrochlorothiazide 100-25 MG per tablet  Commonly known as:  HYZAAR  Take 1 tablet by mouth daily.     NYQUIL PO  Take 30 mLs by mouth at bedtime as needed (cold symptoms).     Potassium Chloride ER 20 MEQ Tbcr  Take 20 mEq by mouth daily.     rizatriptan 10 MG tablet  Commonly known as:  MAXALT  Take 10 mg by mouth daily as needed for migraine. May repeat in 2 hours if needed     topiramate 25 MG tablet  Commonly known as:  TOPAMAX  Take 50 mg by mouth at bedtime.     Vitamin D (Ergocalciferol) 50000 UNITS Caps capsule  Commonly known as:  DRISDOL  Take 5,000 Units by mouth every 7 (seven) days. Mondays        Discharge Condition:   Stable   Disposition: 01-Home or Self Care   Consults:cardiology  Significant Diagnostic Studies: Dg Chest Port 1 View  06/18/2014   CLINICAL DATA:  The chest pain and shortness of breath tonight.  EXAM: PORTABLE CHEST - 1 VIEW  COMPARISON:  12/16/2011  FINDINGS: Left lower chest is off the field of view. Shallow inspiration. Normal heart size and pulmonary vascularity. No focal airspace disease or consolidation in the lungs. No blunting of costophrenic angles. No pneumothorax. Mediastinal contours appear intact.  IMPRESSION: No active disease.   Electronically Signed   By: Lucienne Capers M.D.   On: 06/18/2014 02:20     Microbiology: No results found for this or any previous visit (from the past 240 hour(s)).   Labs: Results for orders placed or performed during the hospital encounter of 06/18/14 (from the past 48 hour(s))  Basic metabolic panel     Status: Abnormal   Collection Time: 06/18/14  2:12 AM  Result Value Ref Range   Sodium 138 137 - 147 mEq/L   Potassium 3.3 (L) 3.7 - 5.3 mEq/L   Chloride 95 (L) 96 - 112  mEq/L   CO2 24 19 - 32 mEq/L   Glucose, Bld 171 (H) 70 - 99 mg/dL   BUN 13 6 - 23 mg/dL   Creatinine, Ser 0.93 0.50 - 1.35 mg/dL   Calcium 9.7 8.4 - 10.5 mg/dL   GFR calc non Af Amer >90 >90 mL/min   GFR calc Af Amer >90 >90 mL/min    Comment: (NOTE) The eGFR has been calculated using the CKD EPI equation. This calculation has not been validated in all clinical situations. eGFR's persistently <90 mL/min signify possible Chronic Kidney Disease.    Anion gap 19 (H) 5 - 15  CBC     Status: Abnormal   Collection Time: 06/18/14  2:12 AM  Result Value Ref Range   WBC 11.6 (H) 4.0 - 10.5 K/uL   RBC 5.16 4.22 - 5.81 MIL/uL   Hemoglobin 14.5 13.0 - 17.0 g/dL   HCT 43.7 39.0 - 52.0 %   MCV 84.7 78.0 - 100.0 fL   MCH 28.1 26.0 - 34.0 pg   MCHC 33.2 30.0 - 36.0 g/dL   RDW 13.9 11.5 - 15.5 %   Platelets 276 150 - 400 K/uL  I-stat troponin, ED (not at [facility])      Status: None   Collection Time: 06/18/14  2:23 AM  Result Value Ref Range   Troponin i, poc 0.03 0.00 - 0.08 ng/mL   Comment 3            Comment: Due to the release kinetics of cTnI, a negative result within the first hours of the onset of symptoms does not rule out myocardial infarction with certainty. If myocardial infarction is still suspected, repeat the test at appropriate intervals.   Glucose, capillary     Status: Abnormal   Collection Time: 06/18/14  4:04 AM  Result Value Ref Range   Glucose-Capillary 215 (H) 70 - 99 mg/dL  Troponin I (q 6hr x 3)     Status: None   Collection Time: 06/18/14  5:50 AM  Result Value Ref Range   Troponin I <0.30 <0.30 ng/mL    Comment:        Due to the release kinetics of cTnI, a negative result within the first hours of the onset of symptoms does not rule out myocardial infarction with certainty. If myocardial infarction is still suspected, repeat the test at appropriate intervals.   Comprehensive metabolic panel     Status: Abnormal   Collection Time: 06/18/14  5:50 AM  Result Value Ref Range   Sodium 138 137 - 147 mEq/L   Potassium 3.4 (L) 3.7 - 5.3 mEq/L   Chloride 98 96 - 112 mEq/L   CO2 23 19 - 32 mEq/L   Glucose, Bld 186 (H) 70 - 99 mg/dL   BUN 11 6 - 23 mg/dL   Creatinine, Ser 0.82 0.50 - 1.35 mg/dL   Calcium 9.4 8.4 - 10.5 mg/dL   Total Protein 7.7 6.0 - 8.3 g/dL   Albumin 3.5 3.5 - 5.2 g/dL   AST 26 0 - 37 U/L   ALT 23 0 - 53 U/L   Alkaline Phosphatase 77 39 - 117 U/L   Total Bilirubin 0.3 0.3 - 1.2 mg/dL   GFR calc non Af Amer >90 >90 mL/min   GFR calc Af Amer >90 >90 mL/min    Comment: (NOTE) The eGFR has been calculated using the CKD EPI equation. This calculation has not been validated in all clinical situations. eGFR's persistently <90 mL/min signify  possible Chronic Kidney Disease.    Anion gap 17 (H) 5 - 15  CBC with Differential     Status: Abnormal   Collection Time: 06/18/14  5:50 AM  Result Value Ref  Range   WBC 11.2 (H) 4.0 - 10.5 K/uL   RBC 4.80 4.22 - 5.81 MIL/uL   Hemoglobin 13.7 13.0 - 17.0 g/dL   HCT 40.6 39.0 - 52.0 %   MCV 84.6 78.0 - 100.0 fL   MCH 28.5 26.0 - 34.0 pg   MCHC 33.7 30.0 - 36.0 g/dL   RDW 13.9 11.5 - 15.5 %   Platelets 325 150 - 400 K/uL   Neutrophils Relative % 55 43 - 77 %   Neutro Abs 6.1 1.7 - 7.7 K/uL   Lymphocytes Relative 38 12 - 46 %   Lymphs Abs 4.3 (H) 0.7 - 4.0 K/uL   Monocytes Relative 6 3 - 12 %   Monocytes Absolute 0.7 0.1 - 1.0 K/uL   Eosinophils Relative 1 0 - 5 %   Eosinophils Absolute 0.1 0.0 - 0.7 K/uL   Basophils Relative 0 0 - 1 %   Basophils Absolute 0.0 0.0 - 0.1 K/uL  TSH     Status: None   Collection Time: 06/18/14  5:50 AM  Result Value Ref Range   TSH 2.370 0.350 - 4.500 uIU/mL  D-dimer, quantitative     Status: None   Collection Time: 06/18/14  5:50 AM  Result Value Ref Range   D-Dimer, Quant <0.27 0.00 - 0.48 ug/mL-FEU    Comment:        AT THE INHOUSE ESTABLISHED CUTOFF VALUE OF 0.48 ug/mL FEU, THIS ASSAY HAS BEEN DOCUMENTED IN THE LITERATURE TO HAVE A SENSITIVITY AND NEGATIVE PREDICTIVE VALUE OF AT LEAST 98 TO 99%.  THE TEST RESULT SHOULD BE CORRELATED WITH AN ASSESSMENT OF THE CLINICAL PROBABILITY OF DVT / VTE.   Glucose, capillary     Status: Abnormal   Collection Time: 06/18/14  7:33 AM  Result Value Ref Range   Glucose-Capillary 164 (H) 70 - 99 mg/dL  Glucose, capillary     Status: Abnormal   Collection Time: 06/18/14 11:25 AM  Result Value Ref Range   Glucose-Capillary 186 (H) 70 - 99 mg/dL  Troponin I (q 6hr x 3)     Status: None   Collection Time: 06/18/14 12:25 PM  Result Value Ref Range   Troponin I <0.30 <0.30 ng/mL    Comment:        Due to the release kinetics of cTnI, a negative result within the first hours of the onset of symptoms does not rule out myocardial infarction with certainty. If myocardial infarction is still suspected, repeat the test at appropriate intervals.      HPI  :Obese 43 yo black male admitted with atypical chest pain. Has been Rx for HTN since age 49. Noncompliant with meds. Poor diet , sedentary from chronic back pain and obese. Denies ETOH or excess NSAID use. He sees Dr Tamala Julian at [place] and indicates normal chemical myovue and echo in July with just LVH. Patient started experiencing chest pain last midnight with pain around the retrosternal area severe crushing in nature associated with diaphoresis and mild shortness of breath. Pain lasted for around 20 minutes and resolved spontaneously. Patient was brought to the ER and was found to have blood pressure more than 528 systolic. Pain improved with BP control No pain this am R/O and no acute ECG changes  HOSPITAL COURSE:  Chest pain In the setting of hypertensive urgency, enzymes negative  Seen by cardiology No further workup indicated Follow-up with at [facility] with Dr Tamala Julian   Accelerated hypertension - probably secondary to patient not taking his medications yesterday and also probably causing his chest pain. Patient's blood pressure improved with nitroglycerin patch   , patient found to have hypokalemia secondary to HCTZ , started on potassium supplement , recheck BMP in one week   Diabetes mellitus type 2 - Discussed importance of compliance. Also low sodium diet, more exercise and weight loss. Continue current meds.  Hyperlipidemia - continue home medications.  OSA on CPAP.  Discharge Exam:  Blood pressure 138/81, pulse 89, temperature 98.6 F (37 C), temperature source Oral, resp. rate 20, height 6' (1.829 m), weight 160.347 kg (353 lb 8 oz), SpO2 98 %.  HEENT: normal Neck supple with no adenopathy JVP normal no bruits no thyromegaly Lungs clear with no wheezing and good diaphragmatic motion Heart: S1/S2 no murmur, no rub, gallop or click PMI normal Abdomen: benighn, BS positve, no tenderness, no AAA no bruit. No HSM or HJR Distal pulses intact with no bruits No  edema Neuro non-focal Skin warm and dry No muscular weakness        Discharge Instructions    Diet - low sodium heart healthy    Complete by:  As directed      Increase activity slowly    Complete by:  As directed              Signed: Ellary Casamento 06/18/2014, 1:50 PM

## 2014-06-18 NOTE — ED Provider Notes (Signed)
CSN: [id number]     Arrival date & time 06/18/14  0136 History   First MD Initiated Contact with Patient 06/18/14 0204     Chief Complaint  Patient presents with  . Chest Pain     (Consider location/radiation/quality/duration/timing/severity/associated sxs/prior Treatment) HPI  Travis Powell is a 43 y.o. male with past medical history of uncontrolled hypertension, diabetes, hyperlipidemia coming in with chest pain.this occurred right after walking upstairs, it was midsternal and left-sided and tight in sensation. This lasted about 20 minutes and spontaneously resolved. It is associated with palpitations, shortness of breath, diaphoresis and nausea. He denies emesis. Patient states this occurred around 12:40 AM.EMS was called, he was given aspirin but no nitroglycerin as his symptoms had resolved. Patient denies any fevers coughing or recent infections. He has no further complaints.  10 Systems reviewed and are negative for acute change except as noted in the HPI.     Past Medical History  Diagnosis Date  . Hypertension   . Diabetes mellitus   . Abdominal lymphadenopathy 06/21/2012    Borderline abdominal/pelvic adenopathy seen incidentally on CT scan 2011.  No change follow up 10/13  . Leukocytosis 06/21/2012    High normal @ 10,000  41 poly, 46% lymphs stable over time 6// to 10/13 no anemia or thrombocytopenia  . Left lumbar radiculopathy 06/21/2012  . DM2 (diabetes mellitus, type 2) 06/21/2012  . HTN (hypertension), benign 06/21/2012  . Migraine   . Obesity    Past Surgical History  Procedure Laterality Date  . Carpal tunnel release     History reviewed. No pertinent family history. History  Substance Use Topics  . Smoking status: Never Smoker   . Smokeless tobacco: Not on file  . Alcohol Use: No    Review of Systems    Allergies  Review of patient's allergies indicates no known allergies.  Home Medications   Prior to Admission medications   Medication Sig Start  Date End Date Taking? Authorizing Provider  amLODipine (NORVASC) 10 MG tablet Take 10 mg by mouth daily.    Historical Provider, MD  carvedilol (COREG) 25 MG tablet Take 1 tablet (25 mg total) by mouth 2 (two) times daily with a meal. 04/08/13   Hannah Muthersbaugh, PA-C  FARXIGA 5 MG TABS Take 5 mg by mouth daily. 01/18/14   Historical Provider, MD  HYDROcodone-acetaminophen (NORCO/VICODIN) 5-325 MG per tablet Take 1 tablet by mouth every 6 (six) hours as needed. Pain    Historical Provider, MD  losartan-hydrochlorothiazide (HYZAAR) 100-25 MG per tablet Take 1 tablet by mouth daily. 01/17/14   Historical Provider, MD  Pseudoeph-Doxylamine-DM-APAP (NYQUIL PO) Take 30 mLs by mouth at bedtime as needed (cold symptoms).    Historical Provider, MD  rizatriptan (MAXALT) 10 MG tablet Take 10 mg by mouth daily as needed for migraine. May repeat in 2 hours if needed    Historical Provider, MD  Vitamin D, Ergocalciferol, (DRISDOL) 50000 UNITS CAPS capsule Take 5,000 Units by mouth every 7 (seven) days. Mondays 01/17/14   Historical Provider, MD   BP 197/102 mmHg  Pulse 101  Temp(Src) 98.4 F (36.9 C) (Oral)  Resp 14  Ht 6' (1.829 m)  Wt 355 lb (161.027 kg)  BMI 48.14 kg/m2  SpO2 99% Physical Exam  Constitutional: He is oriented to person, place, and time. Vital signs are normal. He appears well-developed and well-nourished.  Non-toxic appearance. He does not appear ill. No distress.  Obese male  HENT:  Head: Normocephalic and atraumatic.  Nose:  Nose normal.  Mouth/Throat: Oropharynx is clear and moist. No oropharyngeal exudate.  Eyes: Conjunctivae and EOM are normal. Pupils are equal, round, and reactive to light. No scleral icterus.  Neck: Normal range of motion. Neck supple. No tracheal deviation, no edema, no erythema and normal range of motion present. No thyroid mass and no thyromegaly present.  Cardiovascular: Normal rate, regular rhythm, S1 normal, S2 normal, normal heart sounds, intact distal  pulses and normal pulses.  Exam reveals no gallop and no friction rub.   No murmur heard. Pulses:      Radial pulses are 2+ on the right side, and 2+ on the left side.       Dorsalis pedis pulses are 2+ on the right side, and 2+ on the left side.  Pulmonary/Chest: Effort normal and breath sounds normal. No respiratory distress. He has no wheezes. He has no rhonchi. He has no rales.  Abdominal: Soft. Normal appearance and bowel sounds are normal. He exhibits no distension, no ascites and no mass. There is no hepatosplenomegaly. There is no tenderness. There is no rebound, no guarding and no CVA tenderness.  Musculoskeletal: Normal range of motion. He exhibits no edema or tenderness.  Lymphadenopathy:    He has no cervical adenopathy.  Neurological: He is alert and oriented to person, place, and time. He has normal strength. No cranial nerve deficit or sensory deficit. He exhibits normal muscle tone. GCS eye subscore is 4. GCS verbal subscore is 5. GCS motor subscore is 6.  Skin: Skin is warm, dry and intact. No petechiae and no rash noted. He is not diaphoretic. No erythema. No pallor.  Psychiatric: He has a normal mood and affect. His behavior is normal. Judgment normal.  Nursing note and vitals reviewed.   ED Course  Procedures (including critical care time) Labs Review Labs Reviewed  CBC - Abnormal; Notable for the following:    WBC 11.6 (*)    All other components within normal limits  BASIC METABOLIC PANEL  Rosezena SensorI-STAT TROPOININ, ED    Imaging Review Dg Chest Port 1 View  06/18/2014   CLINICAL DATA:  The chest pain and shortness of breath tonight.  EXAM: PORTABLE CHEST - 1 VIEW  COMPARISON:  12/16/2011  FINDINGS: Left lower chest is off the field of view. Shallow inspiration. Normal heart size and pulmonary vascularity. No focal airspace disease or consolidation in the lungs. No blunting of costophrenic angles. No pneumothorax. Mediastinal contours appear intact.  IMPRESSION: No active  disease.   Electronically Signed   By: Burman NievesWilliam  Stevens M.D.   On: 06/18/2014 02:20     EKG Interpretation   Date/Time:  Tuesday June 18 2014 01:41:13 EST Ventricular Rate:  103 PR Interval:  172 QRS Duration: 100 QT Interval:  379 QTC Calculation: 496 R Axis:   5 Text Interpretation:  Sinus tachycardia Inferior infarct, old Poor R wave  progression No significant change since last tracing Confirmed by Erroll Lunani,  Ulla Mckiernan Ayokunle [phone]([id number]) on 06/18/2014 2:37:45 AM      MDM   Final diagnoses:  None    Patient resents emergency department for chest pain. His heart score is at least 4 as he has a concerning story, has 3 risk factors of hypertension, diabetes, hyperlipidemia, has a positive family history.  Patient was told he'll be retained in the emergency department for continued evaluation. Nitropaste was ordered to help with systolic blood pressures. Patient was admitted to [facility] telemetry unit.    Tomasita CrumbleAdeleke Lean Fayson, MD 06/18/14 0300

## 2014-06-18 NOTE — H&P (Signed)
[facility] History and Physical  Travis FreestoneCraig A Powell ZOX:[id number] DOB: 1971/04/29 DOA: 06/18/2014  Referring physician: ER physician. PCP: PROVIDER NOT IN SYSTEM   Chief Complaint: chest pain.  HPI: Travis Powell is a 43 y.o. male with history of hypertension, diabetes mellitus, hyperlipidemia, OSA, obesity presents to the ER because of chest pain. Patient started experiencing chest pain last midnight with pain around the retrosternal area severe crushing in nature associated with diaphoresis and mild shortness of breath. Pain lasted for around 20 minutes and resolved spontaneously. Patient was brought to the ER and was found to have blood pressure more than 200 systolic. EKG was sinus sinus tachycardia. Cardiac markers were negative and blood pressure improved with nitroglycerin patch. Patient states that he has not taken his blood pressure medications yesterday. Patient otherwise denies any nausea vomiting abdominal pain diarrhea fever chills or productive cough. Patient states he has had a stress test done 6 months ago at [facility]. As per the patient stress test was unremarkable.  Review of Systems: As presented in the history of presenting illness, rest negative.  Past Medical History  Diagnosis Date  . Hypertension   . Diabetes mellitus   . Abdominal lymphadenopathy 06/21/2012    Borderline abdominal/pelvic adenopathy seen incidentally on CT scan 2011.  No change follow up 10/13  . Leukocytosis 06/21/2012    High normal @ 10,000  41 poly, 46% lymphs stable over time 6// to 10/13 no anemia or thrombocytopenia  . Left lumbar radiculopathy 06/21/2012  . DM2 (diabetes mellitus, type 2) 06/21/2012  . HTN (hypertension), benign 06/21/2012  . Migraine   . Obesity    Past Surgical History  Procedure Laterality Date  . Carpal tunnel release     Social History:  reports that he has never smoked. He does not have any smokeless tobacco history on file. He reports that he does not drink  alcohol or use illicit drugs. Where does patient live home. Can patient participate in ADLs? Yes.  No Known Allergies  Family History:  Family History  Problem Relation Age of Onset  . CAD Other   . Stroke Other       Prior to Admission medications   Medication Sig Start Date End Date Taking? Authorizing Provider  amLODipine (NORVASC) 10 MG tablet Take 10 mg by mouth daily.   Yes Historical Provider, MD  atorvastatin (LIPITOR) 20 MG tablet Take 20 mg by mouth at bedtime.   Yes Historical Provider, MD  buPROPion (WELLBUTRIN XL) 150 MG 24 hr tablet Take 150 mg by mouth daily.   Yes Historical Provider, MD  carvedilol (COREG) 25 MG tablet Take 1 tablet (25 mg total) by mouth 2 (two) times daily with a meal. 04/08/13  Yes Hannah Muthersbaugh, PA-C  FARXIGA 5 MG TABS Take 5 mg by mouth daily. 01/18/14  Yes Historical Provider, MD  HYDROcodone-acetaminophen (NORCO/VICODIN) 5-325 MG per tablet Take 1 tablet by mouth every 6 (six) hours as needed for moderate pain. Pain   Yes Historical Provider, MD  losartan-hydrochlorothiazide (HYZAAR) 100-25 MG per tablet Take 1 tablet by mouth daily. 01/17/14  Yes Historical Provider, MD  Pseudoeph-Doxylamine-DM-APAP (NYQUIL PO) Take 30 mLs by mouth at bedtime as needed (cold symptoms).   Yes Historical Provider, MD  rizatriptan (MAXALT) 10 MG tablet Take 10 mg by mouth daily as needed for migraine. May repeat in 2 hours if needed   Yes Historical Provider, MD  topiramate (TOPAMAX) 25 MG tablet Take 50 mg by mouth at bedtime.  Yes Historical Provider, MD  Vitamin D, Ergocalciferol, (DRISDOL) 50000 UNITS CAPS capsule Take 5,000 Units by mouth every 7 (seven) days. Mondays 01/17/14  Yes Historical Provider, MD    Physical Exam: Filed Vitals:   06/18/14 0230 06/18/14 0300 06/18/14 0315 06/18/14 0408  BP: 198/109 165/82 158/80 167/93  Pulse: 100 112 93 89  Temp:    98.6 F (37 C)  TempSrc:    Oral  Resp: 16 19 20 20   Height:    6' (1.829 m)  Weight:     160.347 kg (353 lb 8 oz)  SpO2: 95% 95% 98% 98%     General:  Morbidly obese not in distress.  Eyes: anicteric no pallor.  ENT: no discharge from the ears eyes nose and mouth.  Neck: no mass felt.  Cardiovascular: S1-S2 heard.  Respiratory: no rhonchi or crepitations.  Abdomen: soft nontender bowel sounds present. No guarding or rigidity.  Skin: no rash.  Musculoskeletal: no edema.  Psychiatric: appears normal.  Neurologic: ordering oriented to time place and person. Moves all extremities.  Labs on Admission:  Basic Metabolic Panel:  Recent Labs Lab 06/12/14 0434 06/18/14 0212  NA 139 138  K 3.4* 3.3*  CL 96 95*  CO2 28 24  GLUCOSE 173* 171*  BUN 14 13  CREATININE 1.04 0.93  CALCIUM 9.5 9.7   Liver Function Tests:  Recent Labs Lab 06/12/14 0434  AST 28  ALT 24  ALKPHOS 90  BILITOT 0.3  PROT 8.6*  ALBUMIN 4.1   No results for input(s): LIPASE, AMYLASE in the last 168 hours. No results for input(s): AMMONIA in the last 168 hours. CBC:  Recent Labs Lab 06/12/14 0434 06/18/14 0212  WBC 13.6* 11.6*  HGB 14.0 14.5  HCT 41.8 43.7  MCV 84.1 84.7  PLT 343 276   Cardiac Enzymes: No results for input(s): CKTOTAL, CKMB, CKMBINDEX, TROPONINI in the last 168 hours.  BNP (last 3 results) No results for input(s): PROBNP in the last 8760 hours. CBG:  Recent Labs Lab 06/12/14 0352 06/18/14 0404  GLUCAP 152* 215*    Radiological Exams on Admission: Dg Chest Port 1 View  06/18/2014   CLINICAL DATA:  The chest pain and shortness of breath tonight.  EXAM: PORTABLE CHEST - 1 VIEW  COMPARISON:  12/16/2011  FINDINGS: Left lower chest is off the field of view. Shallow inspiration. Normal heart size and pulmonary vascularity. No focal airspace disease or consolidation in the lungs. No blunting of costophrenic angles. No pneumothorax. Mediastinal contours appear intact.  IMPRESSION: No active disease.   Electronically Signed   By: Burman NievesWilliam  Stevens M.D.   On:  06/18/2014 02:20    EKG: Independently reviewed. Sinus tachycardia.  Assessment/Plan Principal Problem:   Chest pain Active Problems:   DM2 (diabetes mellitus, type 2)   Accelerated hypertension   OSA (obstructive sleep apnea)   Hyperlipidemia   1. Chest pain - given the comorbidities we will cycle cardiac markers to rule out ACS. Patient has had a [facility] 6 months [facility] as per the patient. Presently patient is chest pain-free. Check 2-D echo and d-dimer. Aspirin. 2. Accelerated hypertension - probably secondary to patient not taking his medications yesterday and also probably causing his chest pain. Patient's blood pressure improved with nitroglycerin patch and potassium home medications and closely follow blood pressure trends. Since patient had mild hypokalemia check renin aldosterone ratio. 3. Diabetes mellitus type 2 - closely follow CBGs with sliding-scale coverage and continue medications.  4. Hyperlipidemia - continue home medications. 5. OSA on CPAP.    Code Status: FULL CODE.  Family Communication: PATIENT WIFE AT THE BEDSIDE.  Disposition Plan: ADMIT FOR OBSERVATION.    Timoty Bourke N. [facility] Pager [phone].  If 7PM-7AM, please contact night-coverage [email or web address] Password [email or web address] 06/18/2014, 5:09 AM

## 2014-06-18 NOTE — ED Notes (Signed)
Clarified with Dr. Mora Bellmanni, patient has already received 1 nitro tablet.  Only administer nitro paste at this time for BP management.

## 2014-06-23 ENCOUNTER — Encounter (HOSPITAL_COMMUNITY): Payer: Self-pay | Admitting: Emergency Medicine

## 2014-06-23 ENCOUNTER — Emergency Department (HOSPITAL_COMMUNITY)
Admission: EM | Admit: 2014-06-23 | Discharge: 2014-06-23 | Disposition: A | Discharge status: Home / Self Care | Payer: BC Managed Care – PPO | Attending: Emergency Medicine | Admitting: Emergency Medicine

## 2014-06-23 DIAGNOSIS — F329 Major depressive disorder, single episode, unspecified: Secondary | ICD-10-CM

## 2014-06-23 DIAGNOSIS — Z8739 Personal history of other diseases of the musculoskeletal system and connective tissue: Secondary | ICD-10-CM | POA: Insufficient documentation

## 2014-06-23 DIAGNOSIS — F32A Depression, unspecified: Secondary | ICD-10-CM

## 2014-06-23 DIAGNOSIS — Z79899 Other long term (current) drug therapy: Secondary | ICD-10-CM | POA: Insufficient documentation

## 2014-06-23 DIAGNOSIS — F332 Major depressive disorder, recurrent severe without psychotic features: Secondary | ICD-10-CM

## 2014-06-23 DIAGNOSIS — E669 Obesity, unspecified: Secondary | ICD-10-CM | POA: Insufficient documentation

## 2014-06-23 DIAGNOSIS — R45851 Suicidal ideations: Secondary | ICD-10-CM

## 2014-06-23 DIAGNOSIS — I10 Essential (primary) hypertension: Secondary | ICD-10-CM | POA: Insufficient documentation

## 2014-06-23 DIAGNOSIS — Z7982 Long term (current) use of aspirin: Secondary | ICD-10-CM | POA: Insufficient documentation

## 2014-06-23 DIAGNOSIS — Z862 Personal history of diseases of the blood and blood-forming organs and certain disorders involving the immune mechanism: Secondary | ICD-10-CM | POA: Insufficient documentation

## 2014-06-23 DIAGNOSIS — G40909 Epilepsy, unspecified, not intractable, without status epilepticus: Secondary | ICD-10-CM | POA: Insufficient documentation

## 2014-06-23 DIAGNOSIS — E119 Type 2 diabetes mellitus without complications: Secondary | ICD-10-CM | POA: Insufficient documentation

## 2014-06-23 DIAGNOSIS — F322 Major depressive disorder, single episode, severe without psychotic features: Secondary | ICD-10-CM | POA: Insufficient documentation

## 2014-06-23 LAB — CBC
HCT: 43.3 % (ref 39.0–52.0)
Hemoglobin: 14.4 g/dL (ref 13.0–17.0)
MCH: 28.2 pg (ref 26.0–34.0)
MCHC: 33.3 g/dL (ref 30.0–36.0)
MCV: 84.9 fL (ref 78.0–100.0)
PLATELETS: 359 10*3/uL (ref 150–400)
RBC: 5.1 MIL/uL (ref 4.22–5.81)
RDW: 13.9 % (ref 11.5–15.5)
WBC: 13.6 10*3/uL — ABNORMAL HIGH (ref 4.0–10.5)

## 2014-06-23 LAB — COMPREHENSIVE METABOLIC PANEL
ALT: 24 U/L (ref 0–53)
AST: 23 U/L (ref 0–37)
Albumin: 4.1 g/dL (ref 3.5–5.2)
Alkaline Phosphatase: 85 U/L (ref 39–117)
Anion gap: 15 (ref 5–15)
BUN: 15 mg/dL (ref 6–23)
CALCIUM: 10.4 mg/dL (ref 8.4–10.5)
CO2: 28 mEq/L (ref 19–32)
Chloride: 100 mEq/L (ref 96–112)
Creatinine, Ser: 1.24 mg/dL (ref 0.50–1.35)
GFR calc Af Amer: 80 mL/min — ABNORMAL LOW (ref >90)
GFR calc non Af Amer: 69 mL/min — ABNORMAL LOW (ref >90)
GLUCOSE: 206 mg/dL — AB (ref 70–99)
Potassium: 3.3 mEq/L — ABNORMAL LOW (ref 3.7–5.3)
SODIUM: 143 meq/L (ref 137–147)
Total Bilirubin: 0.4 mg/dL (ref 0.3–1.2)
Total Protein: 8.8 g/dL — ABNORMAL HIGH (ref 6.0–8.3)

## 2014-06-23 LAB — RAPID URINE DRUG SCREEN, HOSP PERFORMED
AMPHETAMINES: NOT DETECTED
Barbiturates: NOT DETECTED
Benzodiazepines: NOT DETECTED
Cocaine: NOT DETECTED
OPIATES: NOT DETECTED
Tetrahydrocannabinol: NOT DETECTED

## 2014-06-23 LAB — ACETAMINOPHEN LEVEL: Acetaminophen (Tylenol), Serum: <15 ug/mL (ref 10–30)

## 2014-06-23 LAB — ETHANOL: Alcohol, Ethyl (B): <11 mg/dL (ref 0–11)

## 2014-06-23 LAB — SALICYLATE LEVEL

## 2014-06-23 LAB — CBG MONITORING, ED: Glucose-Capillary: 173 mg/dL — ABNORMAL HIGH (ref 70–99)

## 2014-06-23 MED ORDER — DAPAGLIFLOZIN PROPANEDIOL 5 MG PO TABS: 5.0000 mg | ORAL_TABLET | Freq: Every day | ORAL | Status: DC

## 2014-06-23 MED ORDER — INSULIN ASPART 100 UNIT/ML ~~LOC~~ SOLN
0.0000 [IU] | Freq: Three times a day (TID) | SUBCUTANEOUS | Status: DC
  Administered 2014-06-23: 3 [IU] via SUBCUTANEOUS
  Filled 2014-06-23: qty 1

## 2014-06-23 MED ORDER — LOSARTAN POTASSIUM 50 MG PO TABS
100.0000 mg | ORAL_TABLET | Freq: Every day | ORAL | Status: DC
  Administered 2014-06-23: 100 mg via ORAL
  Filled 2014-06-23: qty 2

## 2014-06-23 MED ORDER — CARVEDILOL 25 MG PO TABS
25.0000 mg | ORAL_TABLET | Freq: Two times a day (BID) | ORAL | Status: DC
  Administered 2014-06-23: 25 mg via ORAL
  Filled 2014-06-23 (×3): qty 1

## 2014-06-23 MED ORDER — ASPIRIN EC 325 MG PO TBEC
325.0000 mg | DELAYED_RELEASE_TABLET | Freq: Every day | ORAL | Status: DC
  Administered 2014-06-23: 325 mg via ORAL
  Filled 2014-06-23: qty 1

## 2014-06-23 MED ORDER — BUPROPION HCL ER (XL) 150 MG PO TB24
150.0000 mg | ORAL_TABLET | Freq: Every day | ORAL | Status: DC
  Administered 2014-06-23: 150 mg via ORAL
  Filled 2014-06-23: qty 1

## 2014-06-23 MED ORDER — ATORVASTATIN CALCIUM 20 MG PO TABS
20.0000 mg | ORAL_TABLET | Freq: Every day | ORAL | Status: DC
  Filled 2014-06-23: qty 1

## 2014-06-23 MED ORDER — LOSARTAN POTASSIUM-HCTZ 100-25 MG PO TABS: 1.0000 | ORAL_TABLET | ORAL | Status: DC

## 2014-06-23 MED ORDER — AMLODIPINE BESYLATE 10 MG PO TABS
10.0000 mg | ORAL_TABLET | Freq: Every day | ORAL | Status: DC
  Administered 2014-06-23: 10 mg via ORAL
  Filled 2014-06-23: qty 1

## 2014-06-23 MED ORDER — DAPAGLIFLOZIN PROPANEDIOL 5 MG PO TABS
5.0000 mg | ORAL_TABLET | Freq: Every day | ORAL | Status: DC
  Filled 2014-06-23: qty 5

## 2014-06-23 MED ORDER — POTASSIUM CHLORIDE ER 10 MEQ PO TBCR
20.0000 meq | EXTENDED_RELEASE_TABLET | Freq: Every day | ORAL | Status: DC
  Administered 2014-06-23: 20 meq via ORAL
  Filled 2014-06-23: qty 2

## 2014-06-23 MED ORDER — HYDROCHLOROTHIAZIDE 25 MG PO TABS
25.0000 mg | ORAL_TABLET | Freq: Every day | ORAL | Status: DC
  Administered 2014-06-23: 25 mg via ORAL
  Filled 2014-06-23: qty 1

## 2014-06-23 MED ORDER — INSULIN ASPART 100 UNIT/ML ~~LOC~~ SOLN: 0.0000 [IU] | Freq: Every day | SUBCUTANEOUS | Status: DC
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
## 2014-06-23 NOTE — BH Assessment (Signed)
Tele Assessment Note   Travis FreestoneCraig A Powell is an 43 y.o. male presenting [facility] reporting intrusive thoughts. Pt stated "I am having a bunch of different thoughts; I am feeling happy, sad and sexual". Pt also reported that the thoughts are coming every 2-3 minutes. Pt shared that he has been having difficulty sleeping. Pt shared that he hurt his back in 2010 and stated "I haven't been able to go back to work doing what I use to do". "I am trying to get disability".  Pt is reporting suicidal thoughts but stated "I don't have a plan but "thoughts of death comes to mind". Pt shared that he was raised in the church and has good supports around him. Pt did not report any previous suicide attempts or psychiatric hospitalizations. Pt reported that he completed the initial [facility] and was told to return on Monday to see the psychiatrist. Pt also reported that approximately 3 months ago he was contemplating purchasing a gun but he decided against it. Pt also reported that he has a family history of suicide. Pt is endorsing multiple depressive symptoms and shared that he is dealing with some life stressors. PT also reported that he has difficulty sleeping and his appetite is poor. Pt did not report any HI or AVH at this time. Pt denies having access to weapons or firearms and did not report any upcoming court dates or pending criminal charges. Pt did not report any physical, sexual or emotional abuse at this time.  Pt did not report any alcohol or illicit substance abuse at this time. Pt is alert and oriented x3. Pt is calm and cooperative at this time. Pt speech is normal and his thought process is coherent and relevant. Pt mood is euthymic but pleasant and his affect is congruent to his mood. Pt judgment and insight is good. Pt reported that he lives at home with his wife and children. Pt shared that he has a good support system that includes his wife and sister.   Axis I: Adjustment Disorder with Depressed  Mood  Past Medical History:  Past Medical History  Diagnosis Date  . Hypertension   . Diabetes mellitus   . Abdominal lymphadenopathy 06/21/2012    Borderline abdominal/pelvic adenopathy seen incidentally on CT scan 2011.  No change follow up 10/13  . Leukocytosis 06/21/2012    High normal @ 10,000  41 poly, 46% lymphs stable over time 6// to 10/13 no anemia or thrombocytopenia  . Left lumbar radiculopathy 06/21/2012  . DM2 (diabetes mellitus, type 2) 06/21/2012  . HTN (hypertension), benign 06/21/2012  . Migraine   . Obesity     Past Surgical History  Procedure Laterality Date  . Carpal tunnel release      Family History:  Family History  Problem Relation Age of Onset  . CAD Other   . Stroke Other     Social History:  reports that he has never smoked. He does not have any smokeless tobacco history on file. He reports that he does not drink alcohol or use illicit drugs.  Additional Social History:  Alcohol / Drug Use History of alcohol / drug use?: No history of alcohol / drug abuse  CIWA: CIWA-Ar BP: 159/85 mmHg Pulse Rate: 91 COWS:    PATIENT STRENGTHS: (choose at least two) Average or above average intelligence Supportive family/friends  Allergies: No Known Allergies  Home Medications:  (Not in a hospital admission)  OB/GYN Status:  No LMP for male patient.  General Assessment Data Location of Assessment: [facility] ED Is this a Tele or Face-to-Face Assessment?: Face-to-Face Is this an Initial Assessment or a Re-assessment for this encounter?: Initial Assessment Living Arrangements: Spouse/significant other, Children Can pt return to current living arrangement?: Yes Admission Status: Voluntary Is patient capable of signing voluntary admission?: Yes Transfer from: Home Referral Source: Self/Family/Friend     [facility] Crisis Care Plan Living Arrangements: Spouse/significant other, Children Name of Psychiatrist: No provider reported.  (Pt completed intake paperwork at  [facility] ) Name of Therapist: No provider reported.   Education Status Is patient currently in school?: No Current Grade: NA Highest grade of school patient has completed: 10 Name of school: NA Contact person: NA  Risk to self with the past 6 months Suicidal Ideation: Yes-Currently Present Suicidal Intent: No-Not Currently/Within Last 6 Months Is patient at risk for suicide?: No Suicidal Plan?: No Access to Means: No Specify Access to Suicidal Means: No plan reported at this time.  What has been your use of drugs/alcohol within the last 12 months?: No alcohol or drug use reported.  Previous Attempts/Gestures: No How many times?: 0 Other Self Harm Risks: No other self harm risk identified at this time Triggers for Past Attempts: None known Intentional Self Injurious Behavior: None Family Suicide History: Yes (Cousin) Recent stressful life event(s): Financial Problems ("Having to depend on the system". ) Persecutory voices/beliefs?: No Depression: Yes Depression Symptoms: Tearfulness, Fatigue, Guilt, Loss of interest in usual pleasures, Feeling worthless/self pity, Feeling angry/irritable Substance abuse history and/or treatment for substance abuse?: No Suicide prevention information given to non-admitted patients: Not applicable  Risk to Others within the past 6 months Homicidal Ideation: No Thoughts of Harm to Others: No Current Homicidal Intent: No Current Homicidal Plan: No Access to Homicidal Means: No Identified Victim: NA History of harm to others?: No Assessment of Violence: None Noted Violent Behavior Description: No violent behaviors observed. Pt is calm and cooperative at this time. Does patient have access to weapons?: No Criminal Charges Pending?: No Does patient have a court date: No  Psychosis Hallucinations: None noted Delusions: None noted  Mental Status Report Appear/Hygiene: Unremarkable, In hospital gown Eye Contact: Good Motor Activity: Freedom of  movement Speech: Logical/coherent Level of Consciousness: Alert Mood: Euthymic, Pleasant Affect: Appropriate to circumstance Anxiety Level: Minimal Thought Processes: Coherent, Relevant Judgement: Unimpaired Orientation: Person, Place, Time, Situation Obsessive Compulsive Thoughts/Behaviors: None  Cognitive Functioning Concentration: Decreased Memory: Recent Intact, Remote Intact IQ: Average Insight: Good Impulse Control: Good Appetite: Poor Weight Loss: 0 Weight Gain: 0 Sleep: Decreased Total Hours of Sleep: 6 Vegetative Symptoms: Staying in bed, Decreased grooming  ADLScreening [facility]) Patient's cognitive ability adequate to safely complete daily activities?: Yes Patient able to express need for assistance with ADLs?: Yes Independently performs ADLs?: Yes (appropriate for developmental age)  Prior Inpatient Therapy Prior Inpatient Therapy: No Prior Therapy Dates: NA Prior Therapy Facilty/Provider(s): NA Reason for Treatment: NA  Prior Outpatient Therapy Prior Outpatient Therapy: Yes Prior Therapy Dates: current Prior Therapy Facilty/Provider(s): PCP Dr. Jamelle Haring and then Dr. Katrinka Blazing  Reason for Treatment: depression, pain mediction management  ADL Screening (condition at time of admission) Patient's cognitive ability adequate to safely complete daily activities?: Yes Is the patient deaf or have difficulty hearing?: No Does the patient have difficulty seeing, even when wearing glasses/contacts?: No Does the patient have difficulty concentrating, remembering, or making decisions?: No Patient able to express need for assistance with ADLs?: Yes Does the patient have difficulty dressing or bathing?: No Independently  performs ADLs?: Yes (appropriate for developmental age)       Abuse/Neglect Assessment (Assessment to be complete while patient is alone) Physical Abuse: Denies Verbal Abuse: Denies Sexual Abuse: Denies Exploitation of patient/patient's  resources: Denies Self-Neglect: Denies     Merchant navy officerAdvance Directives (For Healthcare) Would patient like information on creating an advanced directive?: No - patient declined information    Additional Information 1:1 In Past 12 Months?: No CIRT Risk: No Elopement Risk: No Does patient have medical clearance?: Yes     Disposition:  Disposition Initial Assessment Completed for this Encounter: Yes Other disposition(s): Other (Comment) (Psychiatric evaluation in the morning. )  Sharin Altidor S 06/23/2014 7:04 AM

## 2014-06-23 NOTE — ED Notes (Signed)
Pharmacy to review med list and will have home meds ordered. Pt denies any further needs at this time.

## 2014-06-23 NOTE — ED Notes (Signed)
Bed: WA22 Expected date:  Expected time:  Means of arrival:  Comments: 

## 2014-06-23 NOTE — ED Notes (Signed)
Pt given breakfast tray

## 2014-06-23 NOTE — [facility] Suicide Risk Assessment (Cosign Needed)
Suicide Risk Assessment  Discharge Assessment     Demographic Factors:  Male  Total Time spent with patient: 30 minutes  Psychiatric Specialty Exam:     Blood pressure 126/75, pulse 89, temperature 98.4 F (36.9 C), temperature source Oral, resp. rate 18, SpO2 99 %.There is no weight on file to calculate BMI.  General Appearance: Casual  Eye Contact:: Good  Speech: Clear and Coherent and Normal Rate  Volume: Normal  Mood: Depressed  Affect: Appropriate and Congruent  Thought Process: Circumstantial, Coherent, Goal Directed and Logical  Orientation: Full (Time, Place, and Person)  Thought Content: WDL  Suicidal Thoughts: No  Homicidal Thoughts: No  Memory: Immediate; Good Recent; Good Remote; Good  Judgement: Intact  Insight: Present  Psychomotor Activity: Normal  Concentration: Good  Recall: Good  Fund of Knowledge:Good  Language: Good  Akathisia: No  Handed: Right  AIMS (if indicated):    Assets: Communication Skills Desire for Improvement Housing Resilience Social Support Transportation  Sleep:     Musculoskeletal: Strength & Muscle Tone: within normal limits Gait & Station: normal Patient leans: N/A   Mental Status Per Nursing Assessment::   On Admission:     Current Mental Status by Physician: Denies homicidal/suicidal ideation, paychosis, and paranoia  Loss Factors: NA  Historical Factors: NA  Risk Reduction Factors:   Sense of responsibility to family, Religious beliefs about death, Living with another person, especially a relative and Positive social support  Continued Clinical Symptoms:  Depression  Cognitive Features That Contribute To Risk:  None noted    Suicide Risk:  Minimal: No identifiable suicidal ideation.  Patients presenting with no risk factors but with morbid ruminations; may be classified as minimal risk based on the severity of the depressive symptoms  Discharge  Diagnoses:  AXIS I: Major Depression, Recurrent severe AXIS II: Deferred AXIS III:  Past Medical History  Diagnosis Date  . Hypertension   . Diabetes mellitus   . Abdominal lymphadenopathy 06/21/2012    Borderline abdominal/pelvic adenopathy seen incidentally on CT scan 2011. No change follow up 10/13  . Leukocytosis 06/21/2012    High normal @ 10,000 41 poly, 46% lymphs stable over time 6// to 10/13 no anemia or thrombocytopenia  . Left lumbar radiculopathy 06/21/2012  . DM2 (diabetes mellitus, type 2) 06/21/2012  . HTN (hypertension), benign 06/21/2012  . Migraine   . Obesity    AXIS IV: occupational problems and other psychosocial or environmental problems AXIS V: 61-70 mild symptoms   Plan Of Care/Follow-up recommendations:  Activity:  as tolerated Diet:  as tolerated  Is patient on multiple antipsychotic therapies at discharge:  No   Has Patient had three or more failed trials of antipsychotic monotherapy by history:  No  Recommended Plan for Multiple Antipsychotic Therapies: NA    Geralyn Figiel, FNP-BC 06/23/2014, 12:32 PM

## 2014-06-23 NOTE — ED Notes (Signed)
Bed: WA20 Expected date:  Expected time:  Means of arrival:  Comments: 

## 2014-06-23 NOTE — ED Provider Notes (Signed)
CSN: [id number]     Arrival date & time 06/23/14  0303 History   First MD Initiated Contact with Patient 06/23/14 0351     Chief Complaint  Patient presents with  . Depression  . Suicidal     (Consider location/radiation/quality/duration/timing/severity/associated sxs/prior Treatment) Patient is a 43 y.o. male presenting with mental health disorder. The history is provided by the patient.  Mental Health Problem Presenting symptoms: depression   Presenting symptoms: no hallucinations, no suicidal threats and no suicide attempt   Patient accompanied by:  Family member Degree of incapacity (severity):  Moderate Onset quality:  Sudden Timing:  Constant Progression:  Worsening Chronicity:  New Context: not alcohol use   Treatment compliance:  All of the time Relieved by:  Nothing Worsened by:  Nothing tried Ineffective treatments: wellbutrin. Associated symptoms: no abdominal pain   Risk factors: no hx of suicide attempts     Past Medical History  Diagnosis Date  . Hypertension   . Diabetes mellitus   . Abdominal lymphadenopathy 06/21/2012    Borderline abdominal/pelvic adenopathy seen incidentally on CT scan 2011.  No change follow up 10/13  . Leukocytosis 06/21/2012    High normal @ 10,000  41 poly, 46% lymphs stable over time 6// to 10/13 no anemia or thrombocytopenia  . Left lumbar radiculopathy 06/21/2012  . DM2 (diabetes mellitus, type 2) 06/21/2012  . HTN (hypertension), benign 06/21/2012  . Migraine   . Obesity    Past Surgical History  Procedure Laterality Date  . Carpal tunnel release     Family History  Problem Relation Age of Onset  . CAD Other   . Stroke Other    History  Substance Use Topics  . Smoking status: Never Smoker   . Smokeless tobacco: Not on file  . Alcohol Use: No    Review of Systems  Gastrointestinal: Negative for abdominal pain.  Psychiatric/Behavioral: Negative for hallucinations.  All other systems reviewed and are  negative.     Allergies  Review of patient's allergies indicates no known allergies.  Home Medications   Prior to Admission medications   Medication Sig Start Date End Date Taking? Authorizing Provider  amLODipine (NORVASC) 10 MG tablet Take 10 mg by mouth daily.    Historical Provider, MD  aspirin EC 325 MG EC tablet Take 1 tablet (325 mg total) by mouth daily. 06/18/14   Richarda OverlieNayana Abrol, MD  atorvastatin (LIPITOR) 20 MG tablet Take 20 mg by mouth at bedtime.    Historical Provider, MD  buPROPion (WELLBUTRIN XL) 150 MG 24 hr tablet Take 150 mg by mouth daily.    Historical Provider, MD  carvedilol (COREG) 25 MG tablet Take 1 tablet (25 mg total) by mouth 2 (two) times daily with a meal. 04/08/13   Hannah Muthersbaugh, PA-C  FARXIGA 5 MG TABS Take 5 mg by mouth daily. 01/18/14   Historical Provider, MD  HYDROcodone-acetaminophen (NORCO/VICODIN) 5-325 MG per tablet Take 1 tablet by mouth every 6 (six) hours as needed for moderate pain. Pain    Historical Provider, MD  losartan-hydrochlorothiazide (HYZAAR) 100-25 MG per tablet Take 1 tablet by mouth daily. 01/17/14   Historical Provider, MD  potassium chloride 20 MEQ TBCR Take 20 mEq by mouth daily. 06/18/14   Richarda OverlieNayana Abrol, MD  Pseudoeph-Doxylamine-DM-APAP (NYQUIL PO) Take 30 mLs by mouth at bedtime as needed (cold symptoms).    Historical Provider, MD  rizatriptan (MAXALT) 10 MG tablet Take 10 mg by mouth daily as needed for migraine. May repeat in 2  hours if needed    Historical Provider, MD  topiramate (TOPAMAX) 25 MG tablet Take 50 mg by mouth at bedtime.    Historical Provider, MD  Vitamin D, Ergocalciferol, (DRISDOL) 50000 UNITS CAPS capsule Take 5,000 Units by mouth every 7 (seven) days. Mondays 01/17/14   Historical Provider, MD   BP 159/85 mmHg  Pulse 91  Temp(Src) 98.1 F (36.7 C) (Oral)  Resp 18  SpO2 98% Physical Exam  Constitutional: He is oriented to person, place, and time. He appears well-developed and well-nourished. No distress.   HENT:  Head: Normocephalic and atraumatic.  Mouth/Throat: Oropharynx is clear and moist.  Eyes: Conjunctivae are normal. Pupils are equal, round, and reactive to light.  Neck: Normal range of motion. Neck supple.  Cardiovascular: Normal rate, regular rhythm and intact distal pulses.   Pulmonary/Chest: Effort normal and breath sounds normal. He has no wheezes. He has no rales.  Abdominal: Soft. Bowel sounds are normal. There is no tenderness. There is no rebound and no guarding.  Musculoskeletal: Normal range of motion.  Neurological: He is alert and oriented to person, place, and time.  Skin: Skin is warm and dry.  Psychiatric: He has a normal mood and affect.    ED Course  Procedures (including critical care time) Labs Review Labs Reviewed  ACETAMINOPHEN LEVEL  CBC  COMPREHENSIVE METABOLIC PANEL  ETHANOL  SALICYLATE LEVEL  URINE RAPID DRUG SCREEN (HOSP PERFORMED)    Imaging Review No results found.   EKG Interpretation None      MDM   Final diagnoses:  None   To be seen by tts    Lalitha Ilyas K Greer Wainright-Rasch, MD 06/23/14 [phone]

## 2014-06-23 NOTE — ED Notes (Signed)
Pt arrived to the ED with a complaint of depression.  Pt states he has constant changing moods and constant changing thoughts of which some are suicidal ideations.  Pt states he does not have a plan .  Pt was seen here previously and [facility]  [facility] and has begun his process but states that he is to aggravated by his constantly shifting thoughts that hse had to seek help.

## 2014-06-23 NOTE — Discharge Instructions (Signed)
Major Depressive Disorder °Major depressive disorder is a mental illness. It also may be called clinical depression or unipolar depression. Major depressive disorder usually causes feelings of sadness, hopelessness, or helplessness. Some people with this disorder do not feel particularly sad but lose interest in doing things they used to enjoy (anhedonia). Major depressive disorder also can cause physical symptoms. It can interfere with work, school, relationships, and other normal everyday activities. The disorder varies in severity but is longer lasting and more serious than the sadness we all feel from time to time in our lives. °Major depressive disorder often is triggered by stressful life events or major life changes. Examples of these triggers include divorce, loss of your job or home, a move, and the death of a family member or close friend. Sometimes this disorder occurs for no obvious reason at all. People who have family members with major depressive disorder or bipolar disorder are at higher risk for developing this disorder, with or without life stressors. Major depressive disorder can occur at any age. It may occur just once in your life (single episode major depressive disorder). It may occur multiple times (recurrent major depressive disorder). °SYMPTOMS °People with major depressive disorder have either anhedonia or depressed mood on nearly a daily basis for at least 2 weeks or longer. Symptoms of depressed mood include: °· Feelings of sadness (blue or down in the dumps) or emptiness. °· Feelings of hopelessness or helplessness. °· Tearfulness or episodes of crying (may be observed by others). °· Irritability (children and adolescents). °In addition to depressed mood or anhedonia or both, people with this disorder have at least four of the following symptoms: °· Difficulty sleeping or sleeping too much.   °· Significant change (increase or decrease) in appetite or weight.   °· Lack of energy or  motivation. °· Feelings of guilt and worthlessness.   °· Difficulty concentrating, remembering, or making decisions. °· Unusually slow movement (psychomotor retardation) or restlessness (as observed by others).   °· Recurrent wishes for death, recurrent thoughts of self-harm (suicide), or a suicide attempt. °People with major depressive disorder commonly have persistent negative thoughts about themselves, other people, and the world. People with severe major depressive disorder may experience distorted beliefs or perceptions about the world (psychotic delusions). They also may see or hear things that are not real (psychotic hallucinations). °DIAGNOSIS °Major depressive disorder is diagnosed through an assessment by your health care provider. Your health care provider will ask about aspects of your daily life, such as mood, sleep, and appetite, to see if you have the diagnostic symptoms of major depressive disorder. Your health care provider may ask about your medical history and use of alcohol or drugs, including prescription medicines. Your health care provider also may do a physical exam and blood work. This is because certain medical conditions and the use of certain substances can cause major depressive disorder-like symptoms (secondary depression). Your health care provider also may refer you to a mental health specialist for further evaluation and treatment. °TREATMENT °It is important to recognize the symptoms of major depressive disorder and seek treatment. The following treatments can be prescribed for this disorder:   °· Medicine. Antidepressant medicines usually are prescribed. Antidepressant medicines are thought to correct chemical imbalances in the brain that are commonly associated with major depressive disorder. Other types of medicine may be added if the symptoms do not respond to antidepressant medicines alone or if psychotic delusions or hallucinations occur. °· Talk therapy. Talk therapy can be  helpful in treating major depressive disorder by providing   support, education, and guidance. Certain types of talk therapy also can help with negative thinking (cognitive behavioral therapy) and with relationship issues that trigger this disorder (interpersonal therapy). °A mental health specialist can help determine which treatment is best for you. Most people with major depressive disorder do well with a combination of medicine and talk therapy. Treatments involving electrical stimulation of the brain can be used in situations with extremely severe symptoms or when medicine and talk therapy do not work over time. These treatments include electroconvulsive therapy, transcranial magnetic stimulation, and vagal nerve stimulation. °Document Released: 11/27/2012 Document Revised: 12/17/2013 Document Reviewed: 11/27/2012 °ExitCare® Patient Information ©2015 [employer]. This information is not intended to replace advice given to you by your health care provider. Make sure you discuss any questions you have with your health care provider. ° ° ° °Suicidal Feelings, How to Help Yourself °Everyone feels sad or unhappy at times, but depressing thoughts and feelings of hopelessness can lead to thoughts of suicide. It can seem as if life is too tough to handle. If you feel as though you have reached the point where suicide is the only answer, it is time to let someone know immediately.  °HOW TO COPE AND PREVENT SUICIDE °· Let family, friends, teachers, or counselors know. Get help. Try not to isolate yourself from those who care about you. Even though you may not feel sociable, talk with someone every day. It is best if it is face-to-face. Remember, they will want to help you. °· Eat a regularly spaced and well-balanced diet. °· Get plenty of rest. °· Avoid alcohol and drugs because they will only make you feel worse and may also lower your inhibitions. Remove them from the home. If you are thinking of taking an overdose of  your prescribed medicines, give your medicines to someone who can give them to you one day at a time. If you are on antidepressants, let your caregiver know of your feelings so he or she can provide a safer medicine, if that is a concern. °· Remove weapons or poisons from your home. °· Try to stick to routines. Follow a schedule and remind yourself that you have to keep that schedule every day. °· Set some realistic goals and achieve them. Make a list and cross things off as you go. Accomplishments give a sense of worth. Wait until you are feeling better before doing things you find difficult or unpleasant to do. °· If you are able, try to start exercising. Even half-hour periods of exercise each day will make you feel better. Getting out in the sun or into nature helps you recover from depression faster. If you have a favorite place to walk, take advantage of that. °· Increase safe activities that have always given you pleasure. This may include playing your favorite music, reading a good book, painting a picture, or playing your favorite instrument. Do whatever takes your mind off your depression. °· Keep your living space well-lighted. °GET HELP °Contact a suicide hotline, crisis center, or local suicide prevention center for help right away. Local centers may include a hospital, clinic, community service organization, social service provider, or health department. °· Call your local emergency services (911 in the [place]). °· Call a suicide hotline: °¨ 1-800-273-TALK (1-800-273-8255) in the [place]. °¨ 1-800-SUICIDE (1-800-784-2433) in the [place]. °¨ 1-888-628-9454 in the [place] for Spanish-speaking counselors. °¨ 1-800-799-4TTY (1-800-799-4889) in the [place] for TTY users. °· Visit the following websites for information and help: °¨   National Suicide Prevention Lifeline: www.suicidepreventionlifeline.org °¨ Hopeline: www.hopeline.com °¨ American Foundation for Suicide Prevention:  www.afsp.org °· For lesbian, gay, bisexual, transgender, or questioning youth, contact The [employer]: °¨ 1-866-4-U-TREVOR (1-866-488-7386) in the [place]. °¨ www.thetrevorproject.org °· In [place], treatment resources are listed in each province with listings available under [employer] or similar titles. Another source for Crisis Centres by Province is located at http://www.suicideprevention.[place]/in-crisis-now/find-a-crisis-centre-now/crisis-centres °Document Released: 02/06/2003 Document Revised: 10/25/2011 Document Reviewed: 11/27/2013 °ExitCare® Patient Information ©2015 [employer]. This information is not intended to replace advice given to you by your health care provider. Make sure you discuss any questions you have with your health care provider. ° °

## 2014-06-23 NOTE — ED Notes (Signed)
Pt resting, family member at bedside, no complaints, pt not requesting any thing at present time, sitter observing

## 2014-06-23 NOTE — BH Assessment (Signed)
Assessment completed. Consulted Maryjean Mornharles Kober, PA-C who recommended that pt be re-evaluated by psychiatry in the am. Dr. Terressa KoyanagiPalumbo-Rasch has been informed of the recommendation.

## 2014-06-26 LAB — ALDOSTERONE + RENIN ACTIVITY W/ RATIO
ALDO / PRA Ratio: 19 Ratio (ref 0.9–28.9)
Aldosterone: 11 ng/dL
PRA LC/MS/MS: 0.58 ng/mL/h (ref 0.25–5.82)

## 2015-11-18 ENCOUNTER — Ambulatory Visit: Payer: Worker's Compensation

## 2015-11-18 ENCOUNTER — Ambulatory Visit (INDEPENDENT_AMBULATORY_CARE_PROVIDER_SITE_OTHER): Payer: Worker's Compensation | Admitting: Physician Assistant

## 2015-11-18 VITALS — BP 150/90 | HR 85 | Temp 98.1°F | Resp 16 | Ht 72.0 in | Wt 332.0 lb

## 2015-11-18 DIAGNOSIS — M79672 Pain in left foot: Secondary | ICD-10-CM

## 2015-11-18 DIAGNOSIS — M25572 Pain in left ankle and joints of left foot: Secondary | ICD-10-CM

## 2015-11-18 DIAGNOSIS — S93402A Sprain of unspecified ligament of left ankle, initial encounter: Secondary | ICD-10-CM | POA: Diagnosis not present

## 2015-11-18 MED ORDER — DICLOFENAC SODIUM 75 MG PO TBEC: 75.0000 mg | DELAYED_RELEASE_TABLET | Freq: Two times a day (BID) | ORAL | Status: DC

## 2015-11-18 NOTE — Progress Notes (Signed)
MRN: [id number] DOB: 24-Oct-1970  Subjective:  Pt presents to clinic with an injury that occurred at work on 3/30.  He tripped on something in the bay floor and inverted his left ankle.  He thought it was just a sprain so he waited over the weekend but it is getting worse.  He took some aleve and it helped a little and he used ice.  He has had trouble at work today because of the pain.  Review of Systems  Objective:  BP 150/90 mmHg  Pulse 85  Temp(Src) 98.1 F (36.7 C) (Oral)  Resp 16  Ht 6' (1.829 m)  Wt 332 lb (150.594 kg)  BMI 45.02 kg/m2  SpO2 98%  Physical Exam  Constitutional: He is oriented to person, place, and time and well-developed, well-nourished, and in no distress.  HENT:  Head: Normocephalic and atraumatic.  Right Ear: External ear normal.  Left Ear: External ear normal.  Eyes: Conjunctivae are normal.  Neck: Normal range of motion.  Pulmonary/Chest: Effort normal.  Musculoskeletal:       Right ankle: Normal.       Left ankle: He exhibits decreased range of motion and swelling (mild). Tenderness. AITFL tenderness found. No lateral malleolus, no head of 5th metatarsal and no proximal fibula tenderness found.       Left foot: There is tenderness. There is normal range of motion.       Feet:  Neurological: He is alert and oriented to person, place, and time. Gait normal.  Skin: Skin is warm and dry.  Psychiatric: Mood, memory, affect and judgment normal.   Dg Ankle Complete Left  11/18/2015  CLINICAL DATA:  Rolled ankle today.  Pain EXAM: LEFT ANKLE COMPLETE - 3+ VIEW COMPARISON:  None. FINDINGS: No acute bony abnormality. Specifically, no fracture, subluxation, or dislocation. Soft tissues are intact. IMPRESSION: No acute bony abnormality. Electronically Signed   By: Charlett NoseKevin  Dover M.D.   On: 11/18/2015 11:56   Dg Foot Complete Left  11/18/2015  CLINICAL DATA:  Rolled ankle today.  Pain EXAM: LEFT FOOT - COMPLETE 3+ VIEW COMPARISON:  None. FINDINGS: No acute bony  abnormality. Specifically, no fracture, subluxation, or dislocation. Soft tissues are intact. Well-circumscribed lucent area within the left great toe proximal phalanx compatible with benign subchondral cyst. Mild joint space narrowing in the first MTP joint. IMPRESSION: No acute bony abnormality. Electronically Signed   By: Charlett NoseKevin  Dover M.D.   On: 11/18/2015 11:57    .  Assessment and Plan :  Foot pain, left - Plan: DG Foot Complete Left  Ankle pain, left - Plan: DG Ankle Complete Left  Ankle sprain, left, initial encounter - Plan: Apply ASO ankle, Care order/instruction, diclofenac (VOLTAREN) 75 MG EC tablet   Ankle/foot sprain - ASO placed on patient for protection - due to his job standing we will put him on sit down work to allow him to heal - we will start on NSAID and he will continue ice.  Recheck in a week  Benny LennertSarah Weber PA-C  [facility] 11/18/2015 1:23 PM

## 2015-11-18 NOTE — Patient Instructions (Signed)
     IF you received an x-ray today, you will receive an invoice from [facility]. Please contact [facility] at [phone] with questions or concerns regarding your invoice.   IF you received labwork today, you will receive an invoice from [facility]. Please contact [facility] at [phone] with questions or concerns regarding your invoice.   Our billing staff will not be able to assist you with questions regarding bills from these companies.  You will be contacted with the lab results as soon as they are available. The fastest way to get your results is to activate your My Chart account. Instructions are located on the last page of this paperwork. If you have not heard from us regarding the results in 2 weeks, please contact this office.      

## 2015-11-25 ENCOUNTER — Ambulatory Visit (INDEPENDENT_AMBULATORY_CARE_PROVIDER_SITE_OTHER): Payer: Worker's Compensation | Admitting: Internal Medicine

## 2015-11-25 VITALS — BP 170/110 | HR 87 | Temp 98.4°F | Resp 18 | Ht 71.0 in | Wt 335.0 lb

## 2015-11-25 DIAGNOSIS — S93402D Sprain of unspecified ligament of left ankle, subsequent encounter: Secondary | ICD-10-CM | POA: Diagnosis not present

## 2015-11-25 NOTE — Progress Notes (Signed)
  By signing my name below I, Shelah LewandowskyJoseph Powell, attest that this documentation has been prepared under the direction and in the presence of Travis Powell. MD. Electonically Signed. Shelah LewandowskyJoseph Powell, Scribe 11/25/2015 at 12:06 PM  Subjective:    Patient ID: Travis Powell, male    DOB: 02-Feb-1971, 45 y.o.   MRN: [id number] Chief Complaint  Patient presents with  . Follow-up    Left foot    HPI Travis FreestoneCraig A Powell is a 45 y.o. male who [facility] to [facility] for follow up examination of his left foot. Pt reports that he tripped over something left on the floor at work on (11/13/15). [facility] and was told to return in a week for a follow up appointment. Pt states that his left ankle is still stiff and feels sore in the morning. Pt states he can walk up the stairs. Pt states he has been doing ABC exercises. Pt states that he has been given time off work because he needs to be able bodied and can stand and bend over and carry heavy objects.  Past Medical History  Diagnosis Date  . Hypertension   . Diabetes mellitus   . Abdominal lymphadenopathy 06/21/2012    Borderline abdominal/pelvic adenopathy seen incidentally on CT scan 2011.  No change follow up 10/13  . Leukocytosis 06/21/2012    High normal @ 10,000  41 poly, 46% lymphs stable over time 6// to 10/13 no anemia or thrombocytopenia  . Left lumbar radiculopathy 06/21/2012  . DM2 (diabetes mellitus, type 2) (HCC) 06/21/2012  . HTN (hypertension), benign 06/21/2012  . Migraine   . Obesity     No Known Allergies   Review of Systems  Musculoskeletal: Positive for joint swelling (left ankle, improving.).       Objective:   Physical Exam  Constitutional: He is oriented to person, place, and time. He appears well-developed and well-nourished.  HENT:  Head: Normocephalic and atraumatic.  Eyes: EOM are normal. Pupils are equal, round, and reactive to light.  Cardiovascular: Normal rate.     Pulmonary/Chest: Effort normal.  Musculoskeletal:  Left ankle: minimal swelling around ATF ligament, with tenderness to palpation. ROM is full and mildly painful. No laxity  Neurological: He is alert and oriented to person, place, and time.  Psychiatric: He has a normal mood and affect. His behavior is normal.  BP 170/110 mmHg  Pulse 87  Temp(Src) 98.4 F (36.9 C) (Oral)  Resp 18  Ht 5\' 11"  (1.803 m)  Wt 335 lb (151.955 kg)  BMI 46.74 kg/m2  SpO2 98% Advised f/u w/ PCP for better BP control       Assessment & Plan:  I have completed the patient encounter in its entirety as documented by the scribe, with editing by me where necessary. Travis Powell P. Merla Richesoolittle, M.D. Ankle sprain, left, subsequent encounter  Contin brace//cont work sitting(they sent him home) Start phase 2 exercises Reck 2 weeks

## 2015-11-25 NOTE — Patient Instructions (Addendum)
IF you received an x-ray today, you will receive an invoice from [facility]. Please contact [facility] at [phone] with questions or concerns regarding your invoice.   IF you received labwork today, you will receive an invoice from [facility]. Please contact [facility] at [phone] with questions or concerns regarding your invoice.   Our billing staff will not be able to assist you with questions regarding bills from these companies.  You will be contacted with the lab results as soon as they are available. The fastest way to get your results is to activate your My Chart account. Instructions are located on the last page of this paperwork. If you have not heard from us regarding the results in 2 weeks, please contact this office.      PHASE II EXERCISES RANGE OF MOTION (ROM) AND STRETCHING EXERCISES - Ankle Sprain, Acute-Phase II After your physician, physical therapist, or athletic trainer feels your knee has made progress significant enough to begin more advanced exercises, he or she may recommend completing some of the following exercises. Although each person heals at different rates, most people will be ready for these exercises between 3 and 4 weeks after their injury. Do not begin these exercises until you have your caregiver's permission. He or she may also advise you to continue with the exercises which you completed in Phase I of your rehabilitation. While completing these exercises, remember:   Restoring tissue flexibility helps normal motion to return to the joints. This allows healthier, less painful movement and activity.  An effective stretch should be held for at least 30 seconds.  A stretch should never be painful. You should only feel a gentle lengthening or release in the stretched tissue. RANGE OF MOTION - Ankle Plantar Flexion   Sit with your right / left leg crossed over your opposite knee.  Use your opposite  hand to pull the top of your foot and toes toward you.  You should feel a gentle stretch on the top of your foot/ankle. Hold this position for __________. Repeat __________ times. Complete __________ times per day.  RANGE OF MOTION - Ankle Eversion  Sit with your right / left ankle crossed over your opposite knee.  Grip your foot with your opposite hand, placing your thumb on the top of your foot and your fingers across the bottom of your foot.  Gently push your foot downward with a slight rotation so your littlest toes rise slightly  You should feel a gentle stretch on the inside of your ankle. Hold the stretch for __________ seconds. Repeat __________ times. Complete this exercise __________ times per day.  RANGE OF MOTION - Ankle Inversion  Sit with your right / left ankle crossed over your opposite knee.  Grip your foot with your opposite hand, placing your thumb on the bottom of your foot and your fingers across the top of your foot.  Gently pull your foot so the smallest toe comes toward you and your thumb pushes the inside of the ball of your foot away from you.  You should feel a gentle stretch on the outside of your ankle. Hold the stretch for __________ seconds. Repeat __________ times. Complete this exercise __________ times per day.  STRETCH - Gastrocsoleus  Sit with your right / left leg extended. Holding onto both ends of a belt or towel, loop it around the ball of your foot.  Keeping your right / left ankle and foot relaxed and your knee straight,  pull your foot and ankle toward you using the belt/towel.  You should feel a gentle stretch behind your calf or knee. Hold this position for __________ seconds. Repeat __________ times. Complete this stretch __________ times per day.  RANGE OF MOTION - Ankle Dorsiflexion, Active Assisted  Remove shoes and sit on a chair that is preferably not on a carpeted surface.  Place right / left foot under knee. Extend your opposite  leg for support.  Keeping your heel down, slide your right / left foot back toward the chair until you feel a stretch at your ankle or calf. If you do not feel a stretch, slide your bottom forward to the edge of the chair while still keeping your heel down.  Hold this stretch for __________ seconds. Repeat __________ times. Complete this stretch __________ times per day.  STRETCH - Gastroc, Standing   Place hands on wall.  Extend right / left leg and place a folded washcloth under the arch of your foot for support. Keep the front knee somewhat bent.  Slightly point your toes inward on your back foot.  Keeping your right / left heel on the floor and your knee straight, shift your weight toward the wall, not allowing your back to arch.  You should feel a gentle stretch in the calf. Hold this position for __________ seconds. Repeat __________ times. Complete this stretch __________ times per day. STRETCH - Soleus, Standing  Place hands on wall.  Extend right / left leg and place a folded washcloth under the arch of your foot for support. Keep the front knee somewhat bent.  Slightly point your toes inward on your back foot.  Keep your right / left heel on the floor, bend your back knee, and slightly shift your weight over the back leg so that you feel a gentle stretch deep in your back calf.  Hold this position for __________ seconds. Repeat __________ times. Complete this stretch __________ times per day. STRETCH - Gastrocsoleus, Standing Note: This exercise can place a lot of stress on your foot and ankle. Please complete this exercise only if specifically instructed by your caregiver.   Place the ball of your right / left foot on a step, keeping your other foot firmly on the same step.  Hold on to the wall or a rail for balance.  Slowly lift your other foot, allowing your body weight to press your heel down over the edge of the step.  You should feel a stretch in your right /  left calf.  Hold this position for __________ seconds.  Repeat this exercise with a slight bend in your knee. Repeat __________ times. Complete this stretch __________ times per day.  STRENGTHENING EXERCISES - Ankle Sprain, Acute-Phase II Around 3 to 4 weeks after your injury, you may progress to some of these exercises in your rehabilitation program. Do not begin these until you have your caregiver's permission. Although your condition has improved, the Phase I exercises will continue to be helpful and you may continue to complete them. As you complete strengthening exercises, remember:   Strong muscles with good endurance tolerate stress better.  Do the exercises as initially prescribed by your caregiver. Progress slowly with each exercise, gradually increasing the number of repetitions and weight used under his or her guidance.  You may experience muscle soreness or fatigue, but the pain or discomfort you are trying to eliminate should never worsen during these exercises. If this pain does worsen, stop and make certain you  are following the directions exactly. If the pain is still present after adjustments, discontinue the exercise until you can discuss the trouble with your caregiver. STRENGTH - Plantar-flexors, Standing  Stand with your feet shoulder width apart. Steady yourself with a wall or table using as little support as needed.  Keeping your weight evenly spread over the width of your feet, rise up on your toes.*  Hold this position for __________ seconds. Repeat __________ times. Complete this exercise __________ times per day.  *If this is too easy, shift your weight toward your right / left leg until you feel challenged. Ultimately, you may be asked to do this exercise with your right / left foot only. STRENGTH - Dorsiflexors and Plantar-flexors, Heel/toe Walking  Dorsiflexion: Walk on your heels only. Keep your toes as high as possible.  Walk for ____________________  seconds/feet.  Repeat __________ times. Complete __________ times per day.  Plantar flexion: Walk on your toes only. Keep your heels as high as possible.  Walk for ____________________ seconds/feet. Repeat __________ times. Complete __________ times per day.  BALANCE - Tandem Walking  Place your uninjured foot on a line 2 to 4 inches wide and at least 10 feet long.  Keeping your balance without using anything for extra support, place your right / left heel directly in front of your other foot.  Slowly raise your back foot up, lifting from the heel to the toes, and place it directly in front of the right / left foot.  Continue to walk along the line slowly. Walk for ____________________ feet. Repeat ____________________ times. Complete ____________________ times per day.  Tip the board from side-to-side. Control the movement so the board does not forcefully strike the ground. The board should silently tap the ground.  Tip the board side-to-side without striking the ground. Occasionally pause and maintain a steady position at various points.  Repeat the first two exercises, but use only your right / left foot. Place your right / left foot directly over the rod/pipe. Repeat __________ times. Complete this exercise __________ times a day.

## 2015-12-04 ENCOUNTER — Telehealth: Payer: Self-pay

## 2015-12-04 ENCOUNTER — Ambulatory Visit (INDEPENDENT_AMBULATORY_CARE_PROVIDER_SITE_OTHER): Payer: Worker's Compensation | Admitting: Physician Assistant

## 2015-12-04 VITALS — BP 177/123 | HR 79 | Temp 98.1°F | Resp 16 | Ht 71.0 in | Wt 336.0 lb

## 2015-12-04 DIAGNOSIS — Y990 Civilian activity done for income or pay: Secondary | ICD-10-CM | POA: Diagnosis not present

## 2015-12-04 DIAGNOSIS — S93402D Sprain of unspecified ligament of left ankle, subsequent encounter: Secondary | ICD-10-CM

## 2015-12-04 MED ORDER — DICLOFENAC SODIUM 75 MG PO TBEC: 75.0000 mg | DELAYED_RELEASE_TABLET | Freq: Two times a day (BID) | ORAL | Status: DC

## 2015-12-04 NOTE — Patient Instructions (Signed)
     IF you received an x-ray today, you will receive an invoice from [facility]. Please contact [facility] at [phone] with questions or concerns regarding your invoice.   IF you received labwork today, you will receive an invoice from [facility]. Please contact [facility] at [phone] with questions or concerns regarding your invoice.   Our billing staff will not be able to assist you with questions regarding bills from these companies.  You will be contacted with the lab results as soon as they are available. The fastest way to get your results is to activate your My Chart account. Instructions are located on the last page of this paperwork. If you have not heard from us regarding the results in 2 weeks, please contact this office.      

## 2015-12-04 NOTE — Telephone Encounter (Signed)
[employer] needs an Attending Provider Statement completed by Dr Merla Richesoolittle I have completed everything I could from the OV notes, please complete the highlighted areas and return to the FMLA/Disability box at the 102 checkout desk within 5-7 business days. I will place these forms in your box on 12/04/15. Thank you!

## 2015-12-04 NOTE — Progress Notes (Signed)
MRN: [id number] DOB: 03/03/1971  Subjective:  Pt presents to clinic with an injury that occurred at work on 11/13/2014.  He is about 50% better which is the same as he was last week.  He started doing more intense PT for his ankle at his last visit and he gets a sharp pain when doing the exercises and afterwards.  The swelling is better and the pain is more localized.  He ran out of the medication yesterday and he feels like it was helping him pain.  He has not been working because they have sent him home because they do not have sit down work to do.  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    He has been wearing the splint and it is helping with him pain a lot.  He has hard to control BP and he has not missed any doses of his BP medications.  Review of Systems  Musculoskeletal: Positive for joint swelling (improved) and gait problem (2nd to ankle pain).    Objective:  BP 177/123 mmHg  Pulse 79  Temp(Src) 98.1 F (36.7 C)  Resp 16  Ht 5\' 11"  (1.803 m)  Wt 336 lb (152.409 kg)  BMI 46.88 kg/m2  Physical Exam  Constitutional: He is oriented to person, place, and time and well-developed, well-nourished, and in no distress.  HENT:  Head: Normocephalic and atraumatic.  Right Ear: External ear normal.  Left Ear: External ear normal.  Eyes: Conjunctivae are normal.  Neck: Normal range of motion.  Pulmonary/Chest: Effort normal.  Musculoskeletal:       Left foot: There is tenderness (over anterior ankle ligament) and swelling (mild lateral anterior ankle swelling). There is normal range of motion and no bony tenderness.       Feet:  Good strength - pain with flexion and extension of his ankle with resistance and pain with inversion of ankle with resistance  Neurological: He is alert and oriented to person, place, and time. Gait normal.  Skin: Skin is warm and dry.  Psychiatric: Mood, memory, affect and judgment normal.    Assessment and Plan :    Work related injury  Ankle sprain, left, subsequent encounter - Plan: diclofenac (VOLTAREN) 75 MG EC tablet, Ambulatory referral to Physical Therapy   Lateral ankle sprain - not improving as quickly as expected - he is having more pain since he started home PT and I am concerned he may not be doing the exercises correctly - we will refer him to formal PT and discussed with the patient that slight increase in pain during and when PT starts is normal but this should improve as he continues PT.  He will continue Ice and the sweedo brace and recheck with us after 2 weeks of PT.  I continued his sit down work for the 1st week of therapy and then will allow him to go back to work regular duty for a half day to get him back to his regular job.  He understands and agrees with this plan.  Elevated BP - d/w pt that his BP is really high - he needs to contact his PCP for an appt for medication adjustment  Benny LennertSarah Kruti Horacek PA-C  [facility] 12/04/2015 11:24 AM

## 2015-12-05 ENCOUNTER — Encounter: Payer: Self-pay | Admitting: Internal Medicine

## 2015-12-08 NOTE — Telephone Encounter (Signed)
Paperwork [facility] and [facility] to [facility] on 12/08/15

## 2015-12-24 ENCOUNTER — Ambulatory Visit (INDEPENDENT_AMBULATORY_CARE_PROVIDER_SITE_OTHER): Payer: Worker's Compensation | Admitting: Physician Assistant

## 2015-12-24 VITALS — BP 132/88 | HR 98 | Temp 98.2°F | Resp 18 | Ht 71.0 in | Wt 336.2 lb

## 2015-12-24 DIAGNOSIS — S93402D Sprain of unspecified ligament of left ankle, subsequent encounter: Secondary | ICD-10-CM | POA: Diagnosis not present

## 2015-12-24 NOTE — Progress Notes (Signed)
[facility] [address], [place] [id number] [phone]  Date:  12/24/2015   Name:  Travis FreestoneCraig A Wechter   DOB:  1970/09/29   MRN:  [id number]  PCP:  PROVIDER NOT IN SYSTEM    Chief Complaint: Follow-up   History of Present Illness:  This is a 45 y.o. male who is presenting for follow up left ankle sprain that occurred at work on 11/13/2015. At last visit saw PA Benny LennertSarah Weber -- symptoms had improved 50%. He had started doing home PT exercises but was seeming to make pain worse. He was referred for formal PT and advised to follow up after 2 weeks. He is here today stating his pain is 85% improved. He has done 3 sessions PT so far. Has 5 sessions left. He is ready to go back to work full duty, part-time.  Denies weakness, paresthesias.  Review of Systems:  Review of Systems See HPI  PMH reviewed  Prior to Admission medications   Medication Sig Start Date End Date Taking? Authorizing Provider  amLODipine (NORVASC) 10 MG tablet Take 10 mg by mouth every morning.    Yes Historical Provider, MD  atorvastatin (LIPITOR) 20 MG tablet Take 20 mg by mouth at bedtime. Reported on 12/04/2015   Yes Historical Provider, MD  carvedilol (COREG) 25 MG tablet Take 1 tablet (25 mg total) by mouth 2 (two) times daily with a meal. 04/08/13  Yes Hannah Muthersbaugh, PA-C  diclofenac (VOLTAREN) 75 MG EC tablet Take 1 tablet (75 mg total) by mouth 2 (two) times daily. 12/04/15  Yes Sarah Harvie BridgeL Weber, PA-C  FARXIGA 5 MG TABS Take 5 mg by mouth daily. Reported on 12/04/2015 01/18/14  Yes Historical Provider, MD  rizatriptan (MAXALT) 10 MG tablet Take 10 mg by mouth daily as needed for migraine. Reported on 12/04/2015   Yes Historical Provider, MD    No Known Allergies  Medication list has been reviewed and updated.  Physical Examination:  Physical Exam  Constitutional: He is oriented to person, place, and time. He appears well-developed and well-nourished. No distress.  HENT:  Head:  Normocephalic and atraumatic.  Right Ear: Hearing normal.  Left Ear: Hearing normal.  Nose: Nose normal.  Eyes: Conjunctivae and lids are normal. Right eye exhibits no discharge. Left eye exhibits no discharge. No scleral icterus.  Pulmonary/Chest: Effort normal. No respiratory distress.  Musculoskeletal: Normal range of motion.  Neurological: He is alert and oriented to person, place, and time.  Skin: Skin is warm, dry and intact. No lesion and no rash noted.  Psychiatric: He has a normal mood and affect. His speech is normal and behavior is normal. Thought content normal.   BP 132/88 mmHg  Pulse 98  Temp(Src) 98.2 F (36.8 C) (Oral)  Resp 18  Ht 5\' 11"  (1.803 m)  Wt 336 lb 3.2 oz (152.499 kg)  BMI 46.91 kg/m2  SpO2 97%  Assessment and Plan:  1. Ankle sprain, left, subsequent encounter Improved significantly since last visit. Doing well with PT sessions. He will go back to work full duty, half days only. Return in 1 week to see if ready to go back to work full-time. Continue PT sessions in the meantime.   Roswell MinersNicole V. Dyke BrackettBush, PA-C, MHS [facility]  12/24/2015

## 2015-12-24 NOTE — Patient Instructions (Signed)
     IF you received an x-ray today, you will receive an invoice from [facility]. Please contact [facility] at [phone] with questions or concerns regarding your invoice.   IF you received labwork today, you will receive an invoice from [facility]. Please contact [facility] at [phone] with questions or concerns regarding your invoice.   Our billing staff will not be able to assist you with questions regarding bills from these companies.  You will be contacted with the lab results as soon as they are available. The fastest way to get your results is to activate your My Chart account. Instructions are located on the last page of this paperwork. If you have not heard from us regarding the results in 2 weeks, please contact this office.      

## 2015-12-31 DIAGNOSIS — Z0271 Encounter for disability determination: Secondary | ICD-10-CM

## 2016-02-03 ENCOUNTER — Telehealth: Payer: Self-pay

## 2016-02-03 NOTE — Telephone Encounter (Signed)
Patient will be coming by today to get a copy of his last doctors note from 12/24/15, I told him that we could print it out and have it ready for him. Thank you!!

## 2016-02-10 ENCOUNTER — Ambulatory Visit (INDEPENDENT_AMBULATORY_CARE_PROVIDER_SITE_OTHER): Payer: Worker's Compensation | Admitting: Urgent Care

## 2016-02-10 VITALS — BP 140/88 | HR 83 | Temp 97.9°F | Resp 18 | Ht 71.0 in | Wt 330.6 lb

## 2016-02-10 DIAGNOSIS — S93402D Sprain of unspecified ligament of left ankle, subsequent encounter: Secondary | ICD-10-CM | POA: Diagnosis not present

## 2016-02-10 DIAGNOSIS — Y990 Civilian activity done for income or pay: Secondary | ICD-10-CM | POA: Diagnosis not present

## 2016-02-10 NOTE — Progress Notes (Signed)
    MRN: [id number] DOB: 1970-08-20  Subjective:   Travis Powell is a 45 y.o. male presenting for chief complaint of Follow-up  Reports completing physical therapy for his ankle sprain sustained while at work. He completed 8 sessions. Denies fever, redness, swelling, pain, trauma. He has used NSAIDs provided.   Taden's medications list, allergies, past medical history and past surgical history were reviewed and excluded from this note due to being a worker's comp case.  Objective:   Vitals: BP 140/88 mmHg  Pulse 83  Temp(Src) 97.9 F (36.6 C) (Oral)  Resp 18  Ht 5\' 11"  (1.803 m)  Wt 330 lb 9.6 oz (149.959 kg)  BMI 46.13 kg/m2  SpO2 97%  Physical Exam  Constitutional: He is oriented to person, place, and time. He appears well-developed and well-nourished.  Cardiovascular: Normal rate.   Pulmonary/Chest: Effort normal.  Musculoskeletal:       Left ankle: He exhibits normal range of motion, no swelling, no ecchymosis, no deformity, no laceration and normal pulse. No tenderness. Achilles tendon exhibits no pain and no defect.  Neurological: He is alert and oriented to person, place, and time. He has normal reflexes.    Assessment and Plan :     Wallis BambergMario Libbey Duce, PA-C [facility] [phone] 02/10/2016 7:26 PM

## 2016-02-10 NOTE — Patient Instructions (Addendum)
RICE for Routine Care of Injuries Theroutine careofmanyinjuriesincludes rest, ice, compression, and elevation (RICE therapy). RICE therapy is often recommended for injuries to soft tissues, such as a muscle strain, ligament injuries, bruises, and overuse injuries. It can also be used for some bony injuries. Using RICE therapy can help to relieve pain, lessen swelling, and enable your body to heal. Rest Rest is required to allow your body to heal. This usually involves reducing your normal activities and avoiding use of the injured part of your body. Generally, you can return to your normal activities when you are comfortable and have been given permission by your health care provider. Ice Icing your injury helps to keep the swelling down, and it lessens pain. Do not apply ice directly to your skin.  Put ice in a plastic bag.  Place a towel between your skin and the bag.  Leave the ice on for 20 minutes, 2-3 times a day. Do this for as long as you are directed by your health care provider. Compression Compression means putting pressure on the injured area. Compression helps to keep swelling down, gives support, and helps with discomfort. Compression may be done with an elastic bandage. If an elastic bandage has been applied, follow these general tips:  Remove and reapply the bandage every 3-4 hours or as directed by your health care provider.  Make sure the bandage is not wrapped too tightly, because this can cut off circulation. If part of your body beyond the bandage becomes blue, numb, cold, swollen, or more painful, your bandage is most likely too tight. If this occurs, remove your bandage and reapply it more loosely.  See your health care provider if the bandage seems to be making your problems worse rather than better. Elevation Elevation means keeping the injured area raised. This helps to lessen swelling and decrease pain. If possible, your injured area should be elevated at or  above the level of your heart or the center of your chest. WHEN SHOULD I SEEK MEDICAL CARE? You should seek medical care if:  Your pain and swelling continue.  Your symptoms are getting worse rather than improving. These symptoms may indicate that further evaluation or further X-rays are needed. Sometimes, X-rays may not show a small broken bone (fracture) until a number of days later. Make a follow-up appointment with your health care provider. WHEN SHOULD I SEEK IMMEDIATE MEDICAL CARE? You should seek immediate medical care if:  You have sudden severe pain at or below the area of your injury.  You have redness or increased swelling around your injury.  You have tingling or numbness at or below the area of your injury that does not improve after you remove the elastic bandage.   This information is not intended to replace advice given to you by your health care provider. Make sure you discuss any questions you have with your health care provider.   Document Released: 11/14/2000 Document Revised: 04/23/2015 Document Reviewed: 07/10/2014 Elsevier Interactive Patient Education 2016 [employer]     IF you received an x-ray today, you will receive an invoice from [facility]. Please contact [facility] at [phone] with questions or concerns regarding your invoice.   IF you received labwork today, you will receive an invoice from [facility]. Please contact [facility] at [phone] with questions or concerns regarding your invoice.   Our billing staff will not be able to assist you with questions regarding bills from these companies.  You will be contacted with   the lab results as soon as they are available. The fastest way to get your results is to activate your My Chart account. Instructions are located on the last page of this paperwork. If you have not heard from us regarding the results in 2 weeks, please contact this office.      

## 2018-02-01 ENCOUNTER — Encounter: Payer: Self-pay | Attending: Family Medicine | Admitting: *Deleted

## 2018-02-01 DIAGNOSIS — Z713 Dietary counseling and surveillance: Secondary | ICD-10-CM | POA: Insufficient documentation

## 2018-02-01 DIAGNOSIS — E119 Type 2 diabetes mellitus without complications: Secondary | ICD-10-CM | POA: Insufficient documentation

## 2018-02-01 NOTE — Patient Instructions (Signed)
Plan:  Aim for 4 Carb Choices per meal (60 grams) +/- 1 either way  Aim for 0-2 Carbs per snack if hungry  Include protein in moderation with your meals and snacks Consider  increasing your activity level by Arm Chair exercises if tolerated for 5-15 minutes daily as tolerated. Consider water exercises if OK with your MD Consider getting a meter and checking BG at alternate times per day   Continue taking medication as directed by MD

## 2018-02-09 NOTE — Progress Notes (Signed)
Diabetes Self-Management Education  Visit Type: First/Initial  Appt. Start Time: 1400 Appt. End Time: 1530  02/09/2018  [name], identified by name and date of birth, is a 47 y.o. male with a diagnosis of Diabetes: Type 2. He states he was diagnosed about 2 years ago. He and his wife don't cook much so many of his meals are restaurant take out or frozen meals. He is not testing his BG yet.   ASSESSMENT  There were no vitals taken for this visit. There is no height or weight on file to calculate BMI.  Diabetes Self-Management Education - 02/01/18 1411      Visit Information   Visit Type  First/Initial      Initial Visit   Diabetes Type  Type 2    Are you currently following a meal plan?  No    Are you taking your medications as prescribed?  Yes    Date Diagnosed  2017      Health Coping   How would you rate your overall health?  Poor      Psychosocial Assessment   Patient Belief/Attitude about Diabetes  Afraid burned out    Other persons present  Patient    Patient Concerns  Weight Control;Nutrition/Meal planning;Glycemic Control    Special Needs  None    Learning Readiness  Contemplating    What is the last grade level you completed in school?  12      Pre-Education Assessment   Patient understands the diabetes disease and treatment process.  Needs Instruction    Patient understands incorporating nutritional management into lifestyle.  Needs Instruction    Patient undertands incorporating physical activity into lifestyle.  Needs Instruction    Patient understands using medications safely.  Needs Instruction    Patient understands monitoring blood glucose, interpreting and using results  Needs Instruction    Patient understands prevention, detection, and treatment of acute complications.  Needs Instruction    Patient understands prevention, detection, and treatment of chronic complications.  Needs Instruction    Patient understands how to develop strategies to  address psychosocial issues.  Needs Instruction    Patient understands how to develop strategies to promote health/change behavior.  Needs Instruction      Complications   Last HgB A1C per patient/outside source  7 %    How often do you check your blood sugar?  0 times/day (not testing)    Have you had a dilated eye exam in the past 12 months?  No    Have you had a dental exam in the past 12 months?  No    Are you checking your feet?  Yes    How many days per week are you checking your feet?  4      Dietary Intake   Breakfast  varies; 2 eggs and bacon, occasionally with 2 toast OR skips about 4 days a week    Lunch  eats only if skipped breakfast: [employer] fried fish plate with green beans, hush puppies    Dinner  frozen dinner, (wife works from 1 PM to 10 PM) OR fast food meal (sandwich with medium fries)     Snack (evening)  popcorn, watermelon    Beverage(s)  water, 16 oz orange juice, diet soda, unsweetened tea with Splenda      Exercise   Exercise Type  ADL's back pain from a fall      Patient Education   Previous Diabetes Education  No  Disease state   Factors that contribute to the development of diabetes    Nutrition management   Role of diet in the treatment of diabetes and the relationship between the three main macronutrients and blood glucose level;Carbohydrate counting;Food label reading, portion sizes and measuring food.;Reviewed blood glucose goals for pre and post meals and how to evaluate the patients' food intake on their blood glucose level.    Physical activity and exercise   Role of exercise on diabetes management, blood pressure control and cardiac health.;Helped patient identify appropriate exercises in relation to his/her diabetes, diabetes complications and other health issue.    Medications  Reviewed patients medication for diabetes, action, purpose, timing of dose and side effects.    Monitoring  Identified appropriate SMBG and/or A1C goals.    Chronic  complications  Relationship between chronic complications and blood glucose control    Psychosocial adjustment  Worked with patient to identify barriers to care and solutions;Role of stress on diabetes      Individualized Goals (developed by patient)   Nutrition  Follow meal plan discussed    Physical Activity  Exercise 3-5 times per week    Medications  take my medication as prescribed    Monitoring   test blood glucose pre and post meals as discussed      Post-Education Assessment   Patient understands the diabetes disease and treatment process.  Demonstrates understanding / competency    Patient understands incorporating nutritional management into lifestyle.  Demonstrates understanding / competency    Patient undertands incorporating physical activity into lifestyle.  Demonstrates understanding / competency    Patient understands using medications safely.  Demonstrates understanding / competency    Patient understands monitoring blood glucose, interpreting and using results  Demonstrates understanding / competency    Patient understands prevention, detection, and treatment of acute complications.  Demonstrates understanding / competency    Patient understands prevention, detection, and treatment of chronic complications.  Demonstrates understanding / competency    Patient understands how to develop strategies to address psychosocial issues.  Demonstrates understanding / competency    Patient understands how to develop strategies to promote health/change behavior.  Demonstrates understanding / competency      Outcomes   Expected Outcomes  Demonstrated interest in learning. Expect positive outcomes    Future DMSE  4-6 wks    Program Status  Completed       Individualized Plan for Diabetes Self-Management Training:   Learning Objective:  Patient will have a greater understanding of diabetes self-management. Patient education plan is to attend individual and/or group sessions per  assessed needs and concerns.   Plan:   Patient Instructions  Plan:  Aim for 4 Carb Choices per meal (60 grams) +/- 1 either way  Aim for 0-2 Carbs per snack if hungry  Include protein in moderation with your meals and snacks Consider  increasing your activity level by Arm Chair exercises if tolerated for 5-15 minutes daily as tolerated. Consider water exercises if OK with your MD Consider getting a meter and checking BG at alternate times per day   Continue taking medication as directed by MD  Expected Outcomes:  Demonstrated interest in learning. Expect positive outcomes  Education material provided: A1C conversion sheet, Meal plan card and Carbohydrate counting sheet, list of [employer]'s in the area, Arm Chair Exercise hand out, APPS resource list  If problems or questions, patient to contact team via:  Phone  Future DSME appointment: 4-6 wks

## 2018-03-22 ENCOUNTER — Ambulatory Visit: Payer: Self-pay | Admitting: *Deleted

## 2019-01-29 ENCOUNTER — Other Ambulatory Visit: Payer: Self-pay

## 2019-01-29 ENCOUNTER — Ambulatory Visit
Admission: EM | Admit: 2019-01-29 | Discharge: 2019-01-29 | Disposition: A | Discharge status: Home / Self Care | Payer: 59 | Attending: Emergency Medicine | Admitting: Emergency Medicine

## 2019-01-29 DIAGNOSIS — I10 Essential (primary) hypertension: Secondary | ICD-10-CM | POA: Diagnosis not present

## 2019-01-29 DIAGNOSIS — Z8639 Personal history of other endocrine, nutritional and metabolic disease: Secondary | ICD-10-CM

## 2019-01-29 DIAGNOSIS — R531 Weakness: Secondary | ICD-10-CM

## 2019-01-29 DIAGNOSIS — Z9114 Patient's other noncompliance with medication regimen: Secondary | ICD-10-CM

## 2019-01-29 LAB — POCT URINALYSIS DIP (MANUAL ENTRY)
Bilirubin, UA: NEGATIVE
Blood, UA: NEGATIVE
Glucose, UA: NEGATIVE mg/dL
Ketones, POC UA: NEGATIVE mg/dL
Leukocytes, UA: NEGATIVE
Nitrite, UA: NEGATIVE
Protein Ur, POC: 30 mg/dL — AB
Spec Grav, UA: 1.02 (ref 1.010–1.025)
Urobilinogen, UA: 0.2 E.U./dL
pH, UA: 7.5 (ref 5.0–8.0)

## 2019-01-29 LAB — POCT FASTING CBG KUC MANUAL ENTRY: POCT Glucose (KUC): 130 mg/dL — AB (ref 70–99)

## 2019-01-29 MED ORDER — LOSARTAN POTASSIUM 100 MG PO TABS: 100.0000 mg | ORAL_TABLET | Freq: Every day | ORAL | 0 refills | Status: DC

## 2019-01-29 MED ORDER — AMLODIPINE BESYLATE 10 MG PO TABS: 10.0000 mg | ORAL_TABLET | ORAL | 0 refills | Status: DC

## 2019-01-29 MED ORDER — METFORMIN HCL 500 MG PO TABS: 500.0000 mg | ORAL_TABLET | Freq: Two times a day (BID) | ORAL | 0 refills | Status: DC

## 2019-01-29 NOTE — ED Triage Notes (Signed)
Pt states on Saturday he felt "wobbly" and had weakness to rt arm. States hasn't had his b/p meds in over a month.

## 2019-01-29 NOTE — ED Provider Notes (Signed)
[name]    CSN: [id number] Arrival date & time: 01/29/19  1004     History   Chief Complaint Chief Complaint  Patient presents with  . Weakness    HPI Travis Powell is a 48 y.o. male.   HPI  Travis Powell is a 48 y.o. male presenting to UC with c/o mild headache with Right sided weakness and feeling "wobbly"  2 days ago.  Symptoms have since resolved, however, his wife encourgaed him to be evaluated today.  He has a hx of HTN and DM but has been out of his BP medication for over 1 month now.  He use to take Losartan and Amlodipine.  Denies hx of stroke or kidney problems.  He does not have a PCP at this time but is interested in reestablishing. Denies chest pain, n/v/d, diaphoresis, nausea or vomiting. Denies change in vision or dizziness.    Past Medical History:  Diagnosis Date  . Abdominal lymphadenopathy 06/21/2012   Borderline abdominal/pelvic adenopathy seen incidentally on CT scan 2011.  No change follow up 10/13  . Diabetes mellitus   . DM2 (diabetes mellitus, type 2) ([place]) 06/21/2012  . HTN (hypertension), benign 06/21/2012  . Hypertension   . Left lumbar radiculopathy 06/21/2012  . Leukocytosis 06/21/2012   High normal @ 10,000  41 poly, 46% lymphs stable over time 6// to 10/13 no anemia or thrombocytopenia  . Migraine   . Obesity     Patient Active Problem List   Diagnosis Date Noted  . MDD (major depressive disorder), severe ([place]) 06/23/2014  . Passive suicidal ideations 06/23/2014  . Chest pain 06/18/2014  . Accelerated hypertension 06/18/2014  . OSA (obstructive sleep apnea) 06/18/2014  . Hyperlipidemia 06/18/2014  . Adjustment disorder with depressed mood 06/12/2014  . Abdominal lymphadenopathy 06/21/2012  . Leukocytosis 06/21/2012  . Left lumbar radiculopathy 06/21/2012  . DM2 (diabetes mellitus, type 2) ([place]) 06/21/2012  . HTN (hypertension), benign 06/21/2012    Past Surgical History:  Procedure Laterality Date  . CARPAL TUNNEL  RELEASE         Home Medications    Prior to Admission medications   Medication Sig Start Date End Date Taking? Authorizing Provider  amLODipine (NORVASC) 10 MG tablet Take 1 tablet (10 mg total) by mouth every morning. 01/29/19   Noe Gens, PA-C  atorvastatin (LIPITOR) 20 MG tablet Take 20 mg by mouth at bedtime. Reported on 12/04/2015    [provider]  carvedilol (COREG) 25 MG tablet Take 1 tablet (25 mg total) by mouth 2 (two) times daily with a meal. Patient not taking: Reported on 02/01/2018 04/08/13   Muthersbaugh, Jarrett Soho, PA-C  diclofenac (VOLTAREN) 75 MG EC tablet Take 1 tablet (75 mg total) by mouth 2 (two) times daily. 12/04/15   Weber, Sarah L, PA-C  FARXIGA 5 MG TABS Take 5 mg by mouth daily. Reported on 12/04/2015 01/18/14   [provider]  gabapentin (NEURONTIN) 300 MG capsule Take 300 mg by mouth 2 (two) times daily as needed.    [provider]  ibuprofen (ADVIL,MOTRIN) 800 MG tablet Take 800 mg by mouth every 8 (eight) hours as needed.    [provider]  losartan (COZAAR) 100 MG tablet Take 1 tablet (100 mg total) by mouth daily. 01/29/19   Noe Gens, PA-C  metFORMIN (GLUCOPHAGE) 500 MG tablet Take 1 tablet (500 mg total) by mouth 2 (two) times daily with a meal. 01/29/19   Noe Gens, PA-C  rizatriptan (MAXALT) 10 MG tablet Take 10 mg by mouth daily as needed for migraine. Reported on 02/10/2016    [provider]    Family History Family History  Problem Relation Age of Onset  . CAD Other   . Stroke Other     Social History Social History   Tobacco Use  . Smoking status: Never Smoker  . Smokeless tobacco: Never Used  Substance Use Topics  . Alcohol use: No  . Drug use: No     Allergies   Patient has no known allergies.   Review of Systems Review of Systems  Constitutional: Negative for chills and fever.  HENT: Negative for congestion, ear pain, sore throat, trouble swallowing and voice change.    Eyes: Negative for photophobia and visual disturbance.  Respiratory: Negative for cough and shortness of breath.   Cardiovascular: Negative for chest pain, palpitations and leg swelling.  Gastrointestinal: Negative for abdominal pain, diarrhea, nausea and vomiting.  Musculoskeletal: Negative for arthralgias, back pain and myalgias.  Skin: Negative for rash.  Neurological: Positive for weakness and headaches. Negative for dizziness, syncope, facial asymmetry, speech difficulty, light-headedness and numbness.     Physical Exam Triage Vital Signs ED Triage Vitals  Enc Vitals Group     BP 01/29/19 1014 (!) 202/100     Pulse Rate 01/29/19 1014 75     Resp 01/29/19 1014 18     Temp 01/29/19 1014 98.5 F (36.9 C)     Temp Source 01/29/19 1014 Oral     SpO2 01/29/19 1014 97 %     Weight --      Height --      Head Circumference --      Peak Flow --      Pain Score 01/29/19 1015 0     Pain Loc --      Pain Edu? --      Excl. in GC? --    Orthostatic VS for the past 24 hrs:  BP- Lying Pulse- Lying BP- Sitting Pulse- Sitting BP- Standing at 0 minutes Pulse- Standing at 0 minutes  01/29/19 1035 (!) 161/101 72 (!) 174/100 70 (!) 172/97 68    Updated Vital Signs BP (!) 202/100 (BP Location: Left Arm)   Pulse 75   Temp 98.5 F (36.9 C) (Oral)   Resp 18   SpO2 97%     Physical Exam Vitals signs and nursing note reviewed.  Constitutional:      General: He is not in acute distress.    Appearance: Normal appearance. He is well-developed. He is obese. He is not ill-appearing, toxic-appearing or diaphoretic.  HENT:     Head: Normocephalic and atraumatic.     Right Ear: Tympanic membrane normal.     Left Ear: Tympanic membrane normal.     Nose: Nose normal.     Mouth/Throat:     Lips: Pink.     Mouth: Mucous membranes are moist.     Pharynx: Oropharynx is clear. Uvula midline.  Eyes:     Extraocular Movements: Extraocular movements intact.     Conjunctiva/sclera: Conjunctivae  normal.     Pupils: Pupils are equal, round, and reactive to light.  Neck:     Musculoskeletal: Normal range of motion and neck supple.  Cardiovascular:     Rate and Rhythm: Normal rate and regular rhythm.  Pulmonary:     Effort: Pulmonary effort is normal. No respiratory distress.     Breath sounds: Normal breath sounds. No stridor.  No wheezing or rhonchi.  Musculoskeletal: Normal range of motion.  Skin:    General: Skin is warm and dry.     Capillary Refill: Capillary refill takes less than 2 seconds.  Neurological:     General: No focal deficit present.     Mental Status: He is alert and oriented to person, place, and time.     Cranial Nerves: No cranial nerve deficit.     Sensory: No sensory deficit.     Motor: No weakness.     Coordination: Coordination normal.     Gait: Gait normal.     Deep Tendon Reflexes: Reflexes normal.  Psychiatric:        Mood and Affect: Mood normal.        Behavior: Behavior normal.      UC Treatments / Results  Labs (all labs ordered are listed, but only abnormal results are displayed) Labs Reviewed  POCT URINALYSIS DIP (MANUAL ENTRY) - Abnormal; Notable for the following components:      Result Value   Protein Ur, POC =30 (*)    All other components within normal limits  POCT FASTING CBG [facility] MANUAL ENTRY - Abnormal; Notable for the following components:   POCT Glucose ([facility]) 130 (*)    All other components within normal limits  CBC  BASIC METABOLIC PANEL    EKG None  Radiology No results found.  Procedures Procedures (including critical care time)  Medications Ordered in UC Medications - No data to display  Initial Impression / Assessment and Plan / UC Course  I have reviewed the triage vital signs and the nursing notes.  Pertinent labs & imaging results that were available during my care of the patient were reviewed by me and considered in my medical decision making (see chart for details).     Exam is normal including  normal neuro exam. Will collect basic labs as pt will be restarted on his BP medications and metformin. He does have some protein in his urine.  Encouraged to monitor his BP at home or at a pharmacy at least once a week Pt plans to establish care with Family Medicine next door with Jerrilyn CairoKim Harris, NP AVS provided.  Final Clinical Impressions(s) / UC Diagnoses   Final diagnoses:  Weakness  Uncontrolled hypertension  Hx of medication noncompliance  History of diabetes mellitus     Discharge Instructions      It is very important to reestablish care with a primary care provider for ongoing healthcare needs including management of your blood pressure, diabetes, and medication refills.  Please take your medications as prescribed.  You may need to be on additional medication, however, that will need to be determined by your primary care provider.  Call 911 or have someone drive you to the hospital if you develop severe headache, change in vision, worsening weakness, slurred speech or confusion, or other new concerning symptoms develop.    ED Prescriptions    Medication Sig Dispense Auth. Provider   amLODipine (NORVASC) 10 MG tablet Take 1 tablet (10 mg total) by mouth every morning. 30 tablet Doroteo GlassmanPhelps, Reather Steller O, PA-C   losartan (COZAAR) 100 MG tablet Take 1 tablet (100 mg total) by mouth daily. 30 tablet Doroteo GlassmanPhelps, Celicia Minahan O, PA-C   metFORMIN (GLUCOPHAGE) 500 MG tablet Take 1 tablet (500 mg total) by mouth 2 (two) times daily with a meal. 30 tablet Lurene ShadowPhelps, Rajesh Wyss O, PA-C     Controlled Substance Prescriptions [place] Controlled Substance Registry consulted? Not Applicable  Lurene Shadowhelps, Shaley Leavens O, [place]PA-C 01/29/19 1141

## 2019-01-29 NOTE — Discharge Instructions (Signed)
°  It is very important to reestablish care with a primary care provider for ongoing healthcare needs including management of your blood pressure, diabetes, and medication refills.  Please take your medications as prescribed.  You may need to be on additional medication, however, that will need to be determined by your primary care provider.  Call 911 or have someone drive you to the hospital if you develop severe headache, change in vision, worsening weakness, slurred speech or confusion, or other new concerning symptoms develop.

## 2019-01-30 LAB — CBC
Hematocrit: 42.2 % (ref 37.5–51.0)
Hemoglobin: 13.6 g/dL (ref 13.0–17.7)
MCH: 26.7 pg (ref 26.6–33.0)
MCHC: 32.2 g/dL (ref 31.5–35.7)
MCV: 83 fL (ref 79–97)
Platelets: 280 10*3/uL (ref 150–450)
RBC: 5.09 x10E6/uL (ref 4.14–5.80)
RDW: 14.3 % (ref 11.6–15.4)
WBC: 9.8 10*3/uL (ref 3.4–10.8)

## 2019-01-30 LAB — BASIC METABOLIC PANEL
BUN/Creatinine Ratio: 14 (ref 9–20)
BUN: 14 mg/dL (ref 6–24)
CO2: 25 mmol/L (ref 20–29)
Calcium: 9.5 mg/dL (ref 8.7–10.2)
Chloride: 100 mmol/L (ref 96–106)
Creatinine, Ser: 1.02 mg/dL (ref 0.76–1.27)
GFR calc Af Amer: 99 mL/min/{1.73_m2} (ref >59)
GFR calc non Af Amer: 86 mL/min/{1.73_m2} (ref >59)
Glucose: 116 mg/dL — ABNORMAL HIGH (ref 65–99)
Potassium: 3.8 mmol/L (ref 3.5–5.2)
Sodium: 140 mmol/L (ref 134–144)

## 2019-01-31 ENCOUNTER — Telehealth (HOSPITAL_COMMUNITY): Payer: Self-pay | Admitting: Emergency Medicine

## 2019-01-31 NOTE — Telephone Encounter (Signed)
Normal labs, Patient contacted and made aware of all results, all questions answered.   

## 2019-02-07 ENCOUNTER — Telehealth: Payer: Self-pay

## 2019-02-07 NOTE — Telephone Encounter (Signed)
Called patient to do their pre-visit COVID screening.  Have you been tested for COVID or are you currently waiting for COVID test results? no  Have you recently traveled internationally([place], [place], [place], [place], [place]) or within the [place] to a hotspot area([place], [place], [place], [place], [place])? no  Are you currently experiencing any of the following: fever, cough, SHOB, fatigue, body aches, loss of smell, rash, diarrhea, vomiting, severe headaches, weakness, sore throat? no  Have you been in contact with anyone who has recently travelled? no  Have you been in contact with anyone who is experiencing any of the above symptoms or been diagnosed with COVID  or works in or has recently visited a SNF? no  Asked patient to come to appointment fasting so that lipid panel could be checked.

## 2019-02-08 ENCOUNTER — Ambulatory Visit (INDEPENDENT_AMBULATORY_CARE_PROVIDER_SITE_OTHER): Payer: 59 | Admitting: Family Medicine

## 2019-02-08 ENCOUNTER — Encounter: Payer: Self-pay | Admitting: Family Medicine

## 2019-02-08 ENCOUNTER — Other Ambulatory Visit: Payer: Self-pay

## 2019-02-08 VITALS — BP 170/98 | HR 82 | Temp 97.4°F | Resp 17 | Ht 70.0 in | Wt 341.0 lb

## 2019-02-08 DIAGNOSIS — R002 Palpitations: Secondary | ICD-10-CM

## 2019-02-08 DIAGNOSIS — Z0100 Encounter for examination of eyes and vision without abnormal findings: Secondary | ICD-10-CM

## 2019-02-08 DIAGNOSIS — I10 Essential (primary) hypertension: Secondary | ICD-10-CM | POA: Diagnosis not present

## 2019-02-08 DIAGNOSIS — E782 Mixed hyperlipidemia: Secondary | ICD-10-CM | POA: Diagnosis not present

## 2019-02-08 DIAGNOSIS — E119 Type 2 diabetes mellitus without complications: Secondary | ICD-10-CM

## 2019-02-08 DIAGNOSIS — Z7689 Persons encountering health services in other specified circumstances: Secondary | ICD-10-CM

## 2019-02-08 DIAGNOSIS — Z125 Encounter for screening for malignant neoplasm of prostate: Secondary | ICD-10-CM

## 2019-02-08 DIAGNOSIS — Z1389 Encounter for screening for other disorder: Secondary | ICD-10-CM

## 2019-02-08 DIAGNOSIS — Z1211 Encounter for screening for malignant neoplasm of colon: Secondary | ICD-10-CM

## 2019-02-08 DIAGNOSIS — R0602 Shortness of breath: Secondary | ICD-10-CM

## 2019-02-08 DIAGNOSIS — G4733 Obstructive sleep apnea (adult) (pediatric): Secondary | ICD-10-CM

## 2019-02-08 DIAGNOSIS — Z6841 Body Mass Index (BMI) 40.0 and over, adult: Secondary | ICD-10-CM

## 2019-02-08 LAB — POCT URINALYSIS DIP (CLINITEK)
Bilirubin, UA: NEGATIVE
Blood, UA: NEGATIVE
Glucose, UA: NEGATIVE mg/dL
Ketones, POC UA: NEGATIVE mg/dL
Leukocytes, UA: NEGATIVE
Nitrite, UA: NEGATIVE
POC PROTEIN,UA: NEGATIVE
Spec Grav, UA: 1.02 (ref 1.010–1.025)
Urobilinogen, UA: 0.2 E.U./dL
pH, UA: 7.5 (ref 5.0–8.0)

## 2019-02-08 MED ORDER — AMLODIPINE BESYLATE 10 MG PO TABS: 10.0000 mg | ORAL_TABLET | ORAL | 0 refills | Status: DC

## 2019-02-08 MED ORDER — LOSARTAN POTASSIUM 100 MG PO TABS: 100.0000 mg | ORAL_TABLET | Freq: Every day | ORAL | 0 refills | Status: DC

## 2019-02-08 MED ORDER — ATORVASTATIN CALCIUM 20 MG PO TABS: 20.0000 mg | ORAL_TABLET | Freq: Every day | ORAL | 3 refills | Status: DC

## 2019-02-08 MED ORDER — LABETALOL HCL 200 MG PO TABS: 200.0000 mg | ORAL_TABLET | Freq: Two times a day (BID) | ORAL | 1 refills | Status: DC

## 2019-02-08 MED ORDER — METFORMIN HCL 500 MG PO TABS: 500.0000 mg | ORAL_TABLET | Freq: Two times a day (BID) | ORAL | 0 refills | Status: DC

## 2019-02-08 NOTE — Patient Instructions (Addendum)
[facility]    Travis Powell was seen by Joaquin CourtsKimberly Harris, FNP today.   Philomena Courseraig A Lear's primary care provider is Bing NeighborsHarris, Kimberly S, FNP.   For the best care possible, you should try to see Joaquin CourtsKimberly Harris, FNP-C whenever you come to the clinic.   We look forward to seeing you again soon!  If you have any questions about your visit today, please call us at [phone] or feel free to reach your primary care provider via MyChart.     Calorie Counting for Weight Loss Calories are units of energy. Your body needs a certain amount of calories from food to keep you going throughout the day. When you eat more calories than your body needs, your body stores the extra calories as fat. When you eat fewer calories than your body needs, your body burns fat to get the energy it needs. Calorie counting means keeping track of how many calories you eat and drink each day. Calorie counting can be helpful if you need to lose weight. If you make sure to eat fewer calories than your body needs, you should lose weight. Ask your health care provider what a healthy weight is for you. For calorie counting to work, you will need to eat the right number of calories in a day in order to lose a healthy amount of weight per week. A dietitian can help you determine how many calories you need in a day and will give you suggestions on how to reach your calorie goal.  A healthy amount of weight to lose per week is usually 1-2 lb (0.5-0.9 kg). This usually means that your daily calorie intake should be reduced by 500-750 calories.  Eating 1,200 - 1,500 calories per day can help most women lose weight.  Eating 1,500 - 1,800 calories per day can help most men lose weight. What is my plan? My goal is to have __________ calories per day. If I have this many calories per day, I should lose around __________ pounds per week. What do I need to know about calorie  counting? In order to meet your daily calorie goal, you will need to:  Find out how many calories are in each food you would like to eat. Try to do this before you eat.  Decide how much of the food you plan to eat.  Write down what you ate and how many calories it had. Doing this is called keeping a food log. To successfully lose weight, it is important to balance calorie counting with a healthy lifestyle that includes regular activity. Aim for 150 minutes of moderate exercise (such as walking) or 75 minutes of vigorous exercise (such as running) each week. Where do I find calorie information?  The number of calories in a food can be found on a Nutrition Facts label. If a food does not have a Nutrition Facts label, try to look up the calories online or ask your dietitian for help. Remember that calories are listed per serving. If you choose to have more than one serving of a food, you will have to multiply the calories per serving by the amount of servings you plan to eat. For example, the label on a package of bread might say that a serving size is 1 slice and that there are 90 calories in a serving. If you eat 1 slice, you will have eaten 90 calories. If you eat 2 slices,  you will have eaten 180 calories. How do I keep a food log? Immediately after each meal, record the following information in your food log:  What you ate. Don't forget to include toppings, sauces, and other extras on the food.  How much you ate. This can be measured in cups, ounces, or number of items.  How many calories each food and drink had.  The total number of calories in the meal. Keep your food log near you, such as in a small notebook in your pocket, or use a mobile app or website. Some programs will calculate calories for you and show you how many calories you have left for the day to meet your goal. What are some calorie counting tips?   Use your calories on foods and drinks that will fill you up and not  leave you hungry: ? Some examples of foods that fill you up are nuts and nut butters, vegetables, lean proteins, and high-fiber foods like whole grains. High-fiber foods are foods with more than 5 g fiber per serving. ? Drinks such as sodas, specialty coffee drinks, alcohol, and juices have a lot of calories, yet do not fill you up.  Eat nutritious foods and avoid empty calories. Empty calories are calories you get from foods or beverages that do not have many vitamins or protein, such as candy, sweets, and soda. It is better to have a nutritious high-calorie food (such as an avocado) than a food with few nutrients (such as a bag of chips).  Know how many calories are in the foods you eat most often. This will help you calculate calorie counts faster.  Pay attention to calories in drinks. Low-calorie drinks include water and unsweetened drinks.  Pay attention to nutrition labels for "low fat" or "fat free" foods. These foods sometimes have the same amount of calories or more calories than the full fat versions. They also often have added sugar, starch, or salt, to make up for flavor that was removed with the fat.  Find a way of tracking calories that works for you. Get creative. Try different apps or programs if writing down calories does not work for you. What are some portion control tips?  Know how many calories are in a serving. This will help you know how many servings of a certain food you can have.  Use a measuring cup to measure serving sizes. You could also try weighing out portions on a kitchen scale. With time, you will be able to estimate serving sizes for some foods.  Take some time to put servings of different foods on your favorite plates, bowls, and cups so you know what a serving looks like.  Try not to eat straight from a bag or box. Doing this can lead to overeating. Put the amount you would like to eat in a cup or on a plate to make sure you are eating the right  portion.  Use smaller plates, glasses, and bowls to prevent overeating.  Try not to multitask (for example, watch TV or use your computer) while eating. If it is time to eat, sit down at a table and enjoy your food. This will help you to know when you are full. It will also help you to be aware of what you are eating and how much you are eating. What are tips for following this plan? Reading food labels  Check the calorie count compared to the serving size. The serving size may be smaller than what you  are used to eating.  Check the source of the calories. Make sure the food you are eating is high in vitamins and protein and low in saturated and trans fats. Shopping  Read nutrition labels while you shop. This will help you make healthy decisions before you decide to purchase your food.  Make a grocery list and stick to it. Cooking  Try to cook your favorite foods in a healthier way. For example, try baking instead of frying.  Use low-fat dairy products. Meal planning  Use more fruits and vegetables. Half of your plate should be fruits and vegetables.  Include lean proteins like poultry and fish. How do I count calories when eating out?  Ask for smaller portion sizes.  Consider sharing an entree and sides instead of getting your own entree.  If you get your own entree, eat only half. Ask for a box at the beginning of your meal and put the rest of your entree in it so you are not tempted to eat it.  If calories are listed on the menu, choose the lower calorie options.  Choose dishes that include vegetables, fruits, whole grains, low-fat dairy products, and lean protein.  Choose items that are boiled, broiled, grilled, or steamed. Stay away from items that are buttered, battered, fried, or served with cream sauce. Items labeled "crispy" are usually fried, unless stated otherwise.  Choose water, low-fat milk, unsweetened iced tea, or other drinks without added sugar. If you want  an alcoholic beverage, choose a lower calorie option such as a glass of wine or light beer.  Ask for dressings, sauces, and syrups on the side. These are usually high in calories, so you should limit the amount you eat.  If you want a salad, choose a garden salad and ask for grilled meats. Avoid extra toppings like bacon, cheese, or fried items. Ask for the dressing on the side, or ask for olive oil and vinegar or lemon to use as dressing.  Estimate how many servings of a food you are given. For example, a serving of cooked rice is  cup or about the size of half a baseball. Knowing serving sizes will help you be aware of how much food you are eating at restaurants. The list below tells you how big or small some common portion sizes are based on everyday objects: ? 1 oz-4 stacked dice. ? 3 oz-1 deck of cards. ? 1 tsp-1 die. ? 1 Tbsp- a ping-pong ball. ? 2 Tbsp-1 ping-pong ball. ?  cup- baseball. ? 1 cup-1 baseball. Summary  Calorie counting means keeping track of how many calories you eat and drink each day. If you eat fewer calories than your body needs, you should lose weight.  A healthy amount of weight to lose per week is usually 1-2 lb (0.5-0.9 kg). This usually means reducing your daily calorie intake by 500-750 calories.  The number of calories in a food can be found on a Nutrition Facts label. If a food does not have a Nutrition Facts label, try to look up the calories online or ask your dietitian for help.  Use your calories on foods and drinks that will fill you up, and not on foods and drinks that will leave you hungry.  Use smaller plates, glasses, and bowls to prevent overeating. This information is not intended to replace advice given to you by your health care provider. Make sure you discuss any questions you have with your health care provider. Document Released: 08/02/2005 Document  Revised: 04/21/2018 Document Reviewed: 07/02/2016 Elsevier Interactive Patient Education   2019 [employer]   Fat and Cholesterol Restricted Eating Plan Getting too much fat and cholesterol in your diet may cause health problems. Choosing the right foods helps keep your fat and cholesterol at normal levels. This can keep you from getting certain diseases. Your doctor may recommend an eating plan that includes:  Total fat: ______% or less of total calories a day.  Saturated fat: ______% or less of total calories a day.  Cholesterol: less than _________mg a day.  Fiber: ______g a day. What are tips for following this plan? Meal planning  At meals, divide your plate into four equal parts: ? Fill one-half of your plate with vegetables and green salads. ? Fill one-fourth of your plate with whole grains. ? Fill one-fourth of your plate with low-fat (lean) protein foods.  Eat fish that is high in omega-3 fats at least two times a week. This includes mackerel, tuna, sardines, and salmon.  Eat foods that are high in fiber, such as whole grains, beans, apples, broccoli, carrots, peas, and barley. General tips   Work with your doctor to lose weight if you need to.  Avoid: ? Foods with added sugar. ? Fried foods. ? Foods with partially hydrogenated oils.  Limit alcohol intake to no more than 1 drink a day for nonpregnant women and 2 drinks a day for men. One drink equals 12 oz of beer, 5 oz of wine, or 1 oz of hard liquor. Reading food labels  Check food labels for: ? Trans fats. ? Partially hydrogenated oils. ? Saturated fat (g) in each serving. ? Cholesterol (mg) in each serving. ? Fiber (g) in each serving.  Choose foods with healthy fats, such as: ? Monounsaturated fats. ? Polyunsaturated fats. ? Omega-3 fats.  Choose grain products that have whole grains. Look for the word "whole" as the first word in the ingredient list. Cooking  Cook foods using low-fat methods. These include baking, boiling, grilling, and broiling.  Eat more home-cooked foods. Eat at  restaurants and buffets less often.  Avoid cooking using saturated fats, such as butter, cream, palm oil, palm kernel oil, and coconut oil. Recommended foods  Fruits  All fresh, canned (in natural juice), or frozen fruits. Vegetables  Fresh or frozen vegetables (raw, steamed, roasted, or grilled). Green salads. Grains  Whole grains, such as whole wheat or whole grain breads, crackers, cereals, and pasta. Unsweetened oatmeal, bulgur, barley, quinoa, or brown rice. Corn or whole wheat flour tortillas. Meats and other protein foods  Ground beef (85% or leaner), grass-fed beef, or beef trimmed of fat. Skinless chicken or [place]. Ground chicken or [place]. Pork trimmed of fat. All fish and seafood. Egg whites. Dried beans, peas, or lentils. Unsalted nuts or seeds. Unsalted canned beans. Nut butters without added sugar or oil. Dairy  Low-fat or nonfat dairy products, such as skim or 1% milk, 2% or reduced-fat cheeses, low-fat and fat-free ricotta or cottage cheese, or plain low-fat and nonfat yogurt. Fats and oils  Tub margarine without trans fats. Light or reduced-fat mayonnaise and salad dressings. Avocado. Olive, canola, sesame, or safflower oils. The items listed above may not be a complete list of foods and beverages you can eat. Contact a dietitian for more information. Foods to avoid Fruits  Canned fruit in heavy syrup. Fruit in cream or butter sauce. Fried fruit. Vegetables  Vegetables cooked in cheese, cream, or butter sauce. Fried vegetables. Grains  White bread. White  pasta. White rice. Cornbread. Bagels, pastries, and croissants. Crackers and snack foods that contain trans fat and hydrogenated oils. Meats and other protein foods  Fatty cuts of meat. Ribs, chicken wings, bacon, sausage, bologna, salami, chitterlings, fatback, hot dogs, bratwurst, and packaged lunch meats. Liver and organ meats. Whole eggs and egg yolks. Chicken and [place] with skin. Fried meat. Dairy  Whole  or 2% milk, cream, half-and-half, and cream cheese. Whole milk cheeses. Whole-fat or sweetened yogurt. Full-fat cheeses. Nondairy creamers and whipped toppings. Processed cheese, cheese spreads, and cheese curds. Beverages  Alcohol. Sugar-sweetened drinks such as sodas, lemonade, and fruit drinks. Fats and oils  Butter, stick margarine, lard, shortening, ghee, or bacon fat. Coconut, palm kernel, and palm oils. Sweets and desserts  Corn syrup, sugars, honey, and molasses. Candy. Jam and jelly. Syrup. Sweetened cereals. Cookies, pies, cakes, donuts, muffins, and ice cream. The items listed above may not be a complete list of foods and beverages you should avoid. Contact a dietitian for more information. Summary  Choosing the right foods helps keep your fat and cholesterol at normal levels. This can keep you from getting certain diseases.  At meals, fill one-half of your plate with vegetables and green salads.  Eat high-fiber foods, like whole grains, beans, apples, carrots, peas, and barley.  Limit added sugar, saturated fats, alcohol, and fried foods. This information is not intended to replace advice given to you by your health care provider. Make sure you discuss any questions you have with your health care provider. Document Released: 02/01/2012 Document Revised: 04/05/2018 Document Reviewed: 04/19/2017 Elsevier Interactive Patient Education  2019 [employer].    Hypertension Hypertension is another name for high blood pressure. High blood pressure forces your heart to work harder to pump blood. This can cause problems over time. There are two numbers in a blood pressure reading. There is a top number (systolic) over a bottom number (diastolic). It is best to have a blood pressure below 120/80. Healthy choices can help lower your blood pressure. You may need medicine to help lower your blood pressure if:  Your blood pressure cannot be lowered with healthy choices.  Your blood  pressure is higher than 130/80. Follow these instructions at home: Eating and drinking   If directed, follow the DASH eating plan. This diet includes: ? Filling half of your plate at each meal with fruits and vegetables. ? Filling one quarter of your plate at each meal with whole grains. Whole grains include whole wheat pasta, brown rice, and whole grain bread. ? Eating or drinking low-fat dairy products, such as skim milk or low-fat yogurt. ? Filling one quarter of your plate at each meal with low-fat (lean) proteins. Low-fat proteins include fish, skinless chicken, eggs, beans, and tofu. ? Avoiding fatty meat, cured and processed meat, or chicken with skin. ? Avoiding premade or processed food.  Eat less than 1,500 mg of salt (sodium) a day.  Limit alcohol use to no more than 1 drink a day for nonpregnant women and 2 drinks a day for men. One drink equals 12 oz of beer, 5 oz of wine, or 1 oz of hard liquor. Lifestyle  Work with your doctor to stay at a healthy weight or to lose weight. Ask your doctor what the best weight is for you.  Get at least 30 minutes of exercise that causes your heart to beat faster (aerobic exercise) most days of the week. This may include walking, swimming, or biking.  Get at least 30  minutes of exercise that strengthens your muscles (resistance exercise) at least 3 days a week. This may include lifting weights or pilates.  Do not use any products that contain nicotine or tobacco. This includes cigarettes and e-cigarettes. If you need help quitting, ask your doctor.  Check your blood pressure at home as told by your doctor.  Keep all follow-up visits as told by your doctor. This is important. Medicines  Take over-the-counter and prescription medicines only as told by your doctor. Follow directions carefully.  Do not skip doses of blood pressure medicine. The medicine does not work as well if you skip doses. Skipping doses also puts you at risk for  problems.  Ask your doctor about side effects or reactions to medicines that you should watch for. Contact a doctor if:  You think you are having a reaction to the medicine you are taking.  You have headaches that keep coming back (recurring).  You feel dizzy.  You have swelling in your ankles.  You have trouble with your vision. Get help right away if:  You get a very bad headache.  You start to feel confused.  You feel weak or numb.  You feel faint.  You get very bad pain in your: ? Chest. ? Belly (abdomen).  You throw up (vomit) more than once.  You have trouble breathing. Summary  Hypertension is another name for high blood pressure.  Making healthy choices can help lower blood pressure. If your blood pressure cannot be controlled with healthy choices, you may need to take medicine. This information is not intended to replace advice given to you by your health care provider. Make sure you discuss any questions you have with your health care provider. Document Released: 01/19/2008 Document Revised: 06/30/2016 Document Reviewed: 06/30/2016 Elsevier Interactive Patient Education  2019 [employer]

## 2019-02-08 NOTE — Progress Notes (Signed)
[name], is a 48 y.o. male  ZOX:[id number]:[id number]  [id number]  DOB - May 17, 1971  CC:  Chief Complaint  Patient presents with  . Establish Care  . Diabetes  . Hypertension  . Hyperlipidemia       HPI: Travis Powell is a 48 y.o. male is here today to establish care.   Travis Freestoneraig A Seelbach has Abdominal lymphadenopathy; Leukocytosis; DM2 (diabetes mellitus, type 2) (HCC); HTN (hypertension), benign; Adjustment disorder with depressed mood; OSA (obstructive sleep apnea); Hyperlipidemia; MDD (major depressive disorder), severe (HCC); Passive suicidal ideations; and Morbid obesity (HCC) on their problem list.    Today's visit:  Travis Powell is an African-American male with a long history of hypertension.  Patient reports he was diagnosed and started on blood pressure medications at age 89. He reports a history of a stress test although cannot recall the outcome of the results but feels he was told that was normal.  Of concern today is recent palpitation patient has been experiencing. He reports that blood pressure has been very difficult control all of his life.  He has never been able to get blood pressure less than 140/90 and has consistently been on at least 2 blood pressure medications throughout the course of his diagnoses.  He reports palpitations occurring at rest and are also associated with shortness of breath.  He denies any overt chest pain although becomes very concerned when the palpitations occur.  He was recently seen at urgent care and was noted to have a blood pressure greater than 200/100.  He had been off blood pressure medicine for a couple years and was only recently resumed on antihypertensive medication a few weeks ago. Today on arrival his blood pressure remains accelerated although has improved from recent urgent care readings.  He has a significant family history of cardiovascular disease on both maternal and paternal sides.  He has a history of OSA and reports consistent CPAP use.  Diagnosed  with OSA 5 years ago.  Diagnosed with diabetes at age 89.  He recalls at diagnosis his A1c was 7.2.  He stopped medication due to insurance and was recently restarted on metformin.  He was previously prescribed for Farxiga and metformin.  He is overdue for his diabetic eye exam.  Denies any chronic neuropathy although has gabapentin on hand if symptoms occur.  He takes gabapentin for chronic hip pain.  He checks his blood sugars at home occasionally and reports readings are mostly between 120 and 130s.  Denies any hypoglycemia.  He endorses polyuria on occasion this has somewhat improved since resuming medication.  Health maintenance Tdap current, last vaccination 2016 Colonoscopy will be due birthday May 17, 1971 patient has no history of colon cancer and would like to have Cologuard as a screening method. No prior evaluation of PSA level will obtain today.   Current medications: Current Outpatient Medications:  .  amLODipine (NORVASC) 10 MG tablet, Take 1 tablet (10 mg total) by mouth every morning., Disp: 30 tablet, Rfl: 0 .  atorvastatin (LIPITOR) 20 MG tablet, Take 20 mg by mouth at bedtime. Reported on 12/04/2015, Disp: , Rfl:  .  gabapentin (NEURONTIN) 300 MG capsule, Take 300 mg by mouth 2 (two) times daily as needed., Disp: , Rfl:  .  losartan (COZAAR) 100 MG tablet, Take 1 tablet (100 mg total) by mouth daily., Disp: 30 tablet, Rfl: 0 .  metFORMIN (GLUCOPHAGE) 500 MG tablet, Take 1 tablet (500 mg total) by mouth 2 (two) times daily with a meal., Disp: 30 tablet, Rfl:  0   Pertinent family medical history: family history includes CAD in an other family member; Stroke in an other family member.   No Known Allergies  Social History   Socioeconomic History  . Marital status: Married    Spouse name: Not on file  . Number of children: Not on file  . Years of education: Not on file  . Highest education level: Not on file  Occupational History  . Not on file  Social Needs  . Financial  resource strain: Not on file  . Food insecurity    Worry: Not on file    Inability: Not on file  . Transportation needs    Medical: Not on file    Non-medical: Not on file  Tobacco Use  . Smoking status: Never Smoker  . Smokeless tobacco: Never Used  Substance and Sexual Activity  . Alcohol use: No  . Drug use: No  . Sexual activity: Yes  Lifestyle  . Physical activity    Days per week: Not on file    Minutes per session: Not on file  . Stress: Not on file  Relationships  . Social Musicianconnections    Talks on phone: Not on file    Gets together: Not on file    Attends religious service: Not on file    Active member of club or organization: Not on file    Attends meetings of clubs or organizations: Not on file    Relationship status: Not on file  . Intimate partner violence    Fear of current or ex partner: Not on file    Emotionally abused: Not on file    Physically abused: Not on file    Forced sexual activity: Not on file  Other Topics Concern  . Not on file  Social History Narrative  . Not on file    Review of Systems: Pertinent negatives listed in HPI Objective:   Vitals:   02/08/19 0927  BP: (!) 170/98  Pulse: 82  Resp: 17  Temp: (!) 97.4 F (36.3 C)  SpO2: 98%    BP Readings from Last 3 Encounters:  02/08/19 (!) 170/98  01/29/19 (!) 202/100  02/10/16 140/88    Filed Weights   02/08/19 0927  Weight: (!) 341 lb (154.7 kg)      Physical Exam: Constitutional: Patient appears well-developed and well-nourished. No distress. HENT: Normocephalic, atraumatic, External right and left ear normal. Oropharynx is clear and moist.  Eyes: Conjunctivae and EOM are normal. PERRLA, no scleral icterus. Neck: Normal ROM. Neck supple. No JVD. No tracheal deviation. No thyromegaly. CVS: RRR, S1/S2 +, no murmurs, no gallops, no carotid bruit.  Pulmonary: Effort and breath sounds normal, no stridor, rhonchi, wheezes, rales.  Abdominal: Soft. BS +, no distension,  tenderness, rebound or guarding.  Musculoskeletal: Normal range of motion. No edema and no tenderness.  Neuro: Alert. Normal muscle tone coordination. Normal gait. BUE and BLE strength 5/5. Bilateral hand grips symmetrical. No cranial nerve deficit. Skin: Skin is warm and dry. No rash noted. Not diaphoretic. No erythema. No pallor. Psychiatric: Normal mood and affect. Behavior, judgment, thought content normal.  Lab Results (prior encounters)  Lab Results  Component Value Date   WBC 9.8 01/29/2019   HGB 13.6 01/29/2019   HCT 42.2 01/29/2019   MCV 83 01/29/2019   PLT 280 01/29/2019   Lab Results  Component Value Date   CREATININE 1.02 01/29/2019   BUN 14 01/29/2019   NA 140 01/29/2019   K 3.8  01/29/2019   CL 100 01/29/2019   CO2 25 01/29/2019       Assessment and plan:  1. Encounter to establish care 2. Screening for blood or protein in urine  3. Type 2 diabetes mellitus without complication, without long-term current use of insulin ([place]), unable to view last A1C. Patient reports on 7's.  -Continue metformin and I check the following labs: - Microalbumin/Creatinine Ratio, Urine - Comprehensive metabolic panel - Hemoglobin A1c - Ambulatory referral to Ophthalmology  4. Essential hypertension, uncontrolled  Adding labetalol 200 mg twice daily, amlodipine 10 mg, and Losartan 100 mg. Patient previously prescribed amlodipine and losartan by previous provider labetalol is new. We have discussed target BP range and blood pressure goal. I have advised patient to check BP regularly and to call us back or report to clinic if the numbers are consistently higher than 140/90.  We discussed the importance of him obtaining a blood pressure cuff and routinely monitoring his blood pressure at home.  We discussed the importance of compliance with medical therapy and DASH diet recommended, consequences of uncontrolled hypertension discussed.  - POCT URINALYSIS DIP ([facility]) - Comprehensive  metabolic panel - CBC with Differential - TSH - EKG 12-Lead, NSR 75 BPM, no acute ST changes  5. Mixed hyperlipidemia -Currently prescribed statin therapy - Lipid Panel - TSH  6. Palpitations - EKG 12-Lead - Ambulatory referral to Cardiology  7. Diabetic eye exam [facility]) - Ambulatory referral to Ophthalmology  8. Shortness of breath -Given accelerated hypertension patient is at high risk for development of heart failure.  Will refer to cardiology for further work-up and likely an echocardiogram to rule out underlying heart failure.   9. OSA (obstructive sleep apnea) -Current uses compliant with CPAP  - Ambulatory referral to Cardiology  10. Accelerated hypertension See# 4 - Ambulatory referral to Cardiology  11. Screening PSA (prostate specific antigen) - PSA  12. Screen for colon cancer - Cologuard  13. Body mass index (BMI) of 45.0-49.9 in adult [facility]) Encouraged efforts to reduce weight include engaging in physical activity as tolerated with goal of 150 minutes per week. Improve dietary choices and eat a meal regimen consistent with a Mediterranean or DASH diet. Reduce simple carbohydrates. Do not skip meals and eat healthy snacks throughout the day to avoid over-eating at dinner. Set a goal weight loss that is achievable for you.   Meds ordered this encounter  Medications  . labetalol (NORMODYNE) 200 MG tablet    Sig: Take 1 tablet (200 mg total) by mouth 2 (two) times daily.    Dispense:  60 tablet    Refill:  1  . amLODipine (NORVASC) 10 MG tablet    Sig: Take 1 tablet (10 mg total) by mouth every morning.    Dispense:  30 tablet    Refill:  0  . losartan (COZAAR) 100 MG tablet    Sig: Take 1 tablet (100 mg total) by mouth daily.    Dispense:  30 tablet    Refill:  0  . atorvastatin (LIPITOR) 20 MG tablet    Sig: Take 1 tablet (20 mg total) by mouth at bedtime. Reported on 12/04/2015    Dispense:  30 tablet    Refill:  3  . metFORMIN (GLUCOPHAGE) 500 MG  tablet    Sig: Take 1 tablet (500 mg total) by mouth 2 (two) times daily with a meal.    Dispense:  30 tablet    Refill:  0     Return for  4 week nurse BP check and 3 month with provider .   The patient was given clear instructions to go to ER or return to medical center if symptoms don't improve, worsen or new problems develop. The patient verbalized understanding. The patient was advised  to call and obtain lab results if they haven't heard anything from out office within 7-10 business days.  Joaquin CourtsKimberly Consuello Lassalle, FNP [facility] [address], [place] [id number] [phone]: [phone]     A total of 40  minutes spent, greater than 50 % of this time was spent counseling and coordination of care.   This note has been created with Education officer, environmentalDragon speech recognition software and smart phrase technology. Any transcriptional errors are unintentional.

## 2019-02-09 LAB — CBC WITH DIFFERENTIAL/PLATELET
Basophils Absolute: 0.1 10*3/uL (ref 0.0–0.2)
Basos: 1 %
EOS (ABSOLUTE): 0.3 10*3/uL (ref 0.0–0.4)
Eos: 3 %
Hematocrit: 42.8 % (ref 37.5–51.0)
Hemoglobin: 14 g/dL (ref 13.0–17.7)
Immature Grans (Abs): 0 10*3/uL (ref 0.0–0.1)
Immature Granulocytes: 0 %
Lymphocytes Absolute: 3.8 10*3/uL — ABNORMAL HIGH (ref 0.7–3.1)
Lymphs: 42 %
MCH: 27.7 pg (ref 26.6–33.0)
MCHC: 32.7 g/dL (ref 31.5–35.7)
MCV: 85 fL (ref 79–97)
Monocytes Absolute: 0.6 10*3/uL (ref 0.1–0.9)
Monocytes: 7 %
Neutrophils Absolute: 4.2 10*3/uL (ref 1.4–7.0)
Neutrophils: 47 %
Platelets: 280 10*3/uL (ref 150–450)
RBC: 5.05 x10E6/uL (ref 4.14–5.80)
RDW: 14.5 % (ref 11.6–15.4)
WBC: 9 10*3/uL (ref 3.4–10.8)

## 2019-02-09 LAB — COMPREHENSIVE METABOLIC PANEL
ALT: 17 IU/L (ref 0–44)
AST: 19 IU/L (ref 0–40)
Albumin/Globulin Ratio: 1.4 (ref 1.2–2.2)
Albumin: 4.6 g/dL (ref 4.0–5.0)
Alkaline Phosphatase: 79 IU/L (ref 39–117)
BUN/Creatinine Ratio: 12 (ref 9–20)
BUN: 11 mg/dL (ref 6–24)
Bilirubin Total: 0.6 mg/dL (ref 0.0–1.2)
CO2: 21 mmol/L (ref 20–29)
Calcium: 9.5 mg/dL (ref 8.7–10.2)
Chloride: 100 mmol/L (ref 96–106)
Creatinine, Ser: 0.93 mg/dL (ref 0.76–1.27)
GFR calc Af Amer: 111 mL/min/{1.73_m2} (ref >59)
GFR calc non Af Amer: 96 mL/min/{1.73_m2} (ref >59)
Globulin, Total: 3.3 g/dL (ref 1.5–4.5)
Glucose: 121 mg/dL — ABNORMAL HIGH (ref 65–99)
Potassium: 3.4 mmol/L — ABNORMAL LOW (ref 3.5–5.2)
Sodium: 142 mmol/L (ref 134–144)
Total Protein: 7.9 g/dL (ref 6.0–8.5)

## 2019-02-09 LAB — LIPID PANEL
Chol/HDL Ratio: 5.5 ratio — ABNORMAL HIGH (ref 0.0–5.0)
Cholesterol, Total: 244 mg/dL — ABNORMAL HIGH (ref 100–199)
HDL: 44 mg/dL (ref >39)
LDL Calculated: 182 mg/dL — ABNORMAL HIGH (ref 0–99)
Triglycerides: 90 mg/dL (ref 0–149)
VLDL Cholesterol Cal: 18 mg/dL (ref 5–40)

## 2019-02-09 LAB — PSA: Prostate Specific Ag, Serum: 0.4 ng/mL (ref 0.0–4.0)

## 2019-02-09 LAB — TSH: TSH: 1.74 u[IU]/mL (ref 0.450–4.500)

## 2019-02-09 LAB — MICROALBUMIN / CREATININE URINE RATIO
Creatinine, Urine: 38.3 mg/dL
Microalb/Creat Ratio: 77 mg/g creat — ABNORMAL HIGH (ref 0–29)
Microalbumin, Urine: 29.4 ug/mL

## 2019-02-09 LAB — HEMOGLOBIN A1C
Est. average glucose Bld gHb Est-mCnc: 146 mg/dL
Hgb A1c MFr Bld: 6.7 % — ABNORMAL HIGH (ref 4.8–5.6)

## 2019-02-13 NOTE — Progress Notes (Signed)
Patient notified of results & recommendations. Expressed understanding.

## 2019-03-08 ENCOUNTER — Ambulatory Visit: Payer: 59

## 2019-05-14 ENCOUNTER — Telehealth: Payer: Self-pay

## 2019-05-14 NOTE — Telephone Encounter (Signed)
Called patient to do their pre-visit COVID screening.  Call went to voicemail. Unable to do prescreening.  

## 2019-05-15 ENCOUNTER — Ambulatory Visit: Payer: 59

## 2019-07-10 ENCOUNTER — Ambulatory Visit: Payer: 59 | Admitting: Cardiovascular Disease

## 2019-07-17 ENCOUNTER — Ambulatory Visit
Admission: EM | Admit: 2019-07-17 | Discharge: 2019-07-17 | Disposition: A | Discharge status: Home / Self Care | Payer: 59 | Attending: Emergency Medicine | Admitting: Emergency Medicine

## 2019-07-17 DIAGNOSIS — Z20828 Contact with and (suspected) exposure to other viral communicable diseases: Secondary | ICD-10-CM

## 2019-07-17 DIAGNOSIS — R21 Rash and other nonspecific skin eruption: Secondary | ICD-10-CM | POA: Diagnosis not present

## 2019-07-17 DIAGNOSIS — Z20822 Contact with and (suspected) exposure to covid-19: Secondary | ICD-10-CM

## 2019-07-17 MED ORDER — TRIAMCINOLONE ACETONIDE 0.5 % EX OINT: 1.0000 "application " | TOPICAL_OINTMENT | Freq: Two times a day (BID) | CUTANEOUS | 0 refills | Status: DC

## 2019-07-17 NOTE — ED Triage Notes (Signed)
Pt presents to get a covid test. States his wife tested positive and her symptoms began 5 days ago. Pt denies symptoms.

## 2019-07-17 NOTE — ED Provider Notes (Signed)
[name]    CSN: [id number] Arrival date & time: 07/17/19  1640      History   Chief Complaint Chief Complaint  Patient presents with  . covid test    HPI Travis Powell is a 48 y.o. male   Presenting for Covid testing: Exposure: Wife who tested positive yesterday, has been symptomatic for 5 days Date of exposure: Lives with exposure/chronic Any fever, symptoms since exposure: No  Patient also notes pruritic rash times a few weeks behind his right shoulder on his back.  Has tried Aquaphor without significant relief.  Denies history of eczema, shingles.  Denies painful prodrome.  No known bite or trauma to the area.  Past Medical History:  Diagnosis Date  . Abdominal lymphadenopathy 06/21/2012   Borderline abdominal/pelvic adenopathy seen incidentally on CT scan 2011.  No change follow up 10/13  . Diabetes mellitus   . DM2 (diabetes mellitus, type 2) ([place]) 06/21/2012  . HTN (hypertension), benign 06/21/2012  . Hypertension   . Left lumbar radiculopathy 06/21/2012  . Leukocytosis 06/21/2012   High normal @ 10,000  41 poly, 46% lymphs stable over time 6// to 10/13 no anemia or thrombocytopenia  . Migraine   . Obesity     Patient Active Problem List   Diagnosis Date Noted  . Morbid obesity ([place]) 02/06/2019  . MDD (major depressive disorder), severe ([place]) 06/23/2014  . Passive suicidal ideations 06/23/2014  . OSA (obstructive sleep apnea) 06/18/2014  . Hyperlipidemia 06/18/2014  . Adjustment disorder with depressed mood 06/12/2014  . Abdominal lymphadenopathy 06/21/2012  . Leukocytosis 06/21/2012  . DM2 (diabetes mellitus, type 2) ([place]) 06/21/2012  . HTN (hypertension), benign 06/21/2012    Past Surgical History:  Procedure Laterality Date  . CARPAL TUNNEL RELEASE         Home Medications    Prior to Admission medications   Medication Sig Start Date End Date Taking? Authorizing Provider  amLODipine (NORVASC) 10 MG tablet Take 1 tablet (10 mg  total) by mouth every morning. 02/08/19  Yes Scot Jun, FNP  atorvastatin (LIPITOR) 20 MG tablet Take 1 tablet (20 mg total) by mouth at bedtime. Reported on 12/04/2015 02/08/19  Yes Scot Jun, FNP  gabapentin (NEURONTIN) 300 MG capsule Take 300 mg by mouth 2 (two) times daily as needed.   Yes [provider]  labetalol (NORMODYNE) 200 MG tablet Take 1 tablet (200 mg total) by mouth 2 (two) times daily. 02/08/19  Yes Scot Jun, FNP  losartan (COZAAR) 100 MG tablet Take 1 tablet (100 mg total) by mouth daily. 02/08/19  Yes Scot Jun, FNP  metFORMIN (GLUCOPHAGE) 500 MG tablet Take 1 tablet (500 mg total) by mouth 2 (two) times daily with a meal. 02/08/19  Yes Scot Jun, FNP  triamcinolone ointment (KENALOG) 0.5 % Apply 1 application topically 2 (two) times daily. 07/17/19   Hall-Potvin, [place], PA-C    Family History Family History  Problem Relation Age of Onset  . CAD Other   . Stroke Other   . Hypertension Mother   . Diabetes Mother   . Hypertension Father     Social History Social History   Tobacco Use  . Smoking status: Never Smoker  . Smokeless tobacco: Never Used  Substance Use Topics  . Alcohol use: No  . Drug use: No     Allergies   Patient has no known allergies.   Review of Systems Review of Systems  Constitutional: Negative for fatigue and  fever.  Respiratory: Negative for cough and shortness of breath.   Cardiovascular: Negative for chest pain and palpitations.  Gastrointestinal: Negative for abdominal pain, diarrhea and vomiting.  Musculoskeletal: Negative for arthralgias and myalgias.  Skin: Positive for rash. Negative for wound.  Neurological: Negative for speech difficulty and headaches.  All other systems reviewed and are negative.    Physical Exam Triage Vital Signs ED Triage Vitals  Enc Vitals Group     BP 07/17/19 1649 (!) 183/96     Pulse Rate 07/17/19 1649 79     Resp 07/17/19 1649 18     Temp  07/17/19 1649 99 F (37.2 C)     Temp src --      SpO2 07/17/19 1649 98 %     Weight --      Height --      Head Circumference --      Peak Flow --      Pain Score 07/17/19 1646 0     Pain Loc --      Pain Edu? --      Excl. in GC? --    No data found.  Updated Vital Signs BP (!) 183/96   Pulse 79   Temp 99 F (37.2 C)   Resp 18   SpO2 98%   Visual Acuity Right Eye Distance:   Left Eye Distance:   Bilateral Distance:    Right Eye Near:   Left Eye Near:    Bilateral Near:     Physical Exam Constitutional:      General: He is not in acute distress.    Appearance: He is obese. He is not ill-appearing.  HENT:     Head: Normocephalic and atraumatic.  Eyes:     General: No scleral icterus.    Pupils: Pupils are equal, round, and reactive to light.  Cardiovascular:     Rate and Rhythm: Normal rate.  Pulmonary:     Effort: Pulmonary effort is normal. No respiratory distress.     Breath sounds: No wheezing.  Skin:    Comments: Small, 4 cm area of dry skin.  No open wound, discharge, erythema, warmth, tenderness.  Neurological:     Mental Status: He is alert and oriented to person, place, and time.      UC Treatments / Results  Labs (all labs ordered are listed, but only abnormal results are displayed) Labs Reviewed  NOVEL CORONAVIRUS, NAA    EKG   Radiology No results found.  Procedures Procedures (including critical care time)  Medications Ordered in UC Medications - No data to display  Initial Impression / Assessment and Plan / UC Course  I have reviewed the triage vital signs and the nursing notes.  Pertinent labs & imaging results that were available during my care of the patient were reviewed by me and considered in my medical decision making (see chart for details).     Covid PCR pending: Patient to quarantine until results are back.  Regarding rash: This appears to be a benign process that has been resolving slowly.  Will trial  triamcinolone as it is pruritic.  Return precautions discussed, patient verbalized understanding and is agreeable to plan. Final Clinical Impressions(s) / UC Diagnoses   Final diagnoses:  Exposure to COVID-19 virus  Rash     Discharge Instructions     Your COVID test is pending - it is important to quarantine / isolate at home until your results are back. If you test positive and  would like further evaluation for persistent or worsening symptoms, you may schedule an E-visit or virtual (video) visit throughout the [facility] app or website.  PLEASE NOTE: If you develop severe chest pain or shortness of breath please go to the ER or call 9-1-1 for further evaluation --> DO NOT schedule electronic or virtual visits for this. Please call our office for further guidance / recommendations as needed.    ED Prescriptions    Medication Sig Dispense Auth. Provider   triamcinolone ointment (KENALOG) 0.5 % Apply 1 application topically 2 (two) times daily. 30 g Hall-Potvin, [place], PA-C     PDMP not reviewed this encounter.   Hall-Potvin, [place], [place] 07/17/19 1741

## 2019-07-17 NOTE — Discharge Instructions (Signed)
Your COVID test is pending - it is important to quarantine / isolate at home until your results are back. °If you test positive and would like further evaluation for persistent or worsening symptoms, you may schedule an E-visit or virtual (video) visit throughout the [facility] MyChart app or website. ° °PLEASE NOTE: If you develop severe chest pain or shortness of breath please go to the ER or call 9-1-1 for further evaluation --> DO NOT schedule electronic or virtual visits for this. °Please call our office for further guidance / recommendations as needed. °

## 2019-07-19 LAB — NOVEL CORONAVIRUS, NAA: SARS-CoV-2, NAA: NOT DETECTED

## 2020-10-08 DIAGNOSIS — Z6841 Body Mass Index (BMI) 40.0 and over, adult: Secondary | ICD-10-CM | POA: Diagnosis not present

## 2020-10-08 DIAGNOSIS — Z13220 Encounter for screening for lipoid disorders: Secondary | ICD-10-CM | POA: Diagnosis not present

## 2020-10-08 DIAGNOSIS — Z713 Dietary counseling and surveillance: Secondary | ICD-10-CM | POA: Diagnosis not present

## 2020-10-08 DIAGNOSIS — Z136 Encounter for screening for cardiovascular disorders: Secondary | ICD-10-CM | POA: Diagnosis not present

## 2020-10-08 DIAGNOSIS — I10 Essential (primary) hypertension: Secondary | ICD-10-CM | POA: Diagnosis not present

## 2020-11-10 ENCOUNTER — Emergency Department (HOSPITAL_COMMUNITY)

## 2020-11-10 ENCOUNTER — Other Ambulatory Visit: Payer: Self-pay

## 2020-11-10 ENCOUNTER — Emergency Department (HOSPITAL_COMMUNITY)
Admission: EM | Admit: 2020-11-10 | Discharge: 2020-11-10 | Disposition: A | Discharge status: Home / Self Care | Attending: Emergency Medicine | Admitting: Emergency Medicine

## 2020-11-10 ENCOUNTER — Encounter (HOSPITAL_COMMUNITY): Payer: Self-pay

## 2020-11-10 DIAGNOSIS — Y990 Civilian activity done for income or pay: Secondary | ICD-10-CM | POA: Insufficient documentation

## 2020-11-10 DIAGNOSIS — E119 Type 2 diabetes mellitus without complications: Secondary | ICD-10-CM | POA: Insufficient documentation

## 2020-11-10 DIAGNOSIS — W268XXA Contact with other sharp object(s), not elsewhere classified, initial encounter: Secondary | ICD-10-CM | POA: Diagnosis not present

## 2020-11-10 DIAGNOSIS — Y92810 Car as the place of occurrence of the external cause: Secondary | ICD-10-CM | POA: Diagnosis not present

## 2020-11-10 DIAGNOSIS — S81811A Laceration without foreign body, right lower leg, initial encounter: Secondary | ICD-10-CM | POA: Diagnosis not present

## 2020-11-10 DIAGNOSIS — R059 Cough, unspecified: Secondary | ICD-10-CM | POA: Diagnosis not present

## 2020-11-10 DIAGNOSIS — R58 Hemorrhage, not elsewhere classified: Secondary | ICD-10-CM | POA: Diagnosis not present

## 2020-11-10 DIAGNOSIS — S8991XA Unspecified injury of right lower leg, initial encounter: Secondary | ICD-10-CM | POA: Diagnosis present

## 2020-11-10 DIAGNOSIS — I10 Essential (primary) hypertension: Secondary | ICD-10-CM | POA: Insufficient documentation

## 2020-11-10 DIAGNOSIS — Z743 Need for continuous supervision: Secondary | ICD-10-CM | POA: Diagnosis not present

## 2020-11-10 MED ORDER — BACITRACIN ZINC 500 UNIT/GM EX OINT
TOPICAL_OINTMENT | Freq: Once | CUTANEOUS | Status: AC
  Administered 2020-11-10: 1 via TOPICAL
  Filled 2020-11-10: qty 0.9

## 2020-11-10 MED ORDER — LIDOCAINE-EPINEPHRINE (PF) 2 %-1:200000 IJ SOLN
10.0000 mL | Freq: Once | INTRAMUSCULAR | Status: DC
  Filled 2020-11-10: qty 10

## 2020-11-10 NOTE — ED Provider Notes (Signed)
[facility]ENC[facility] Provider Note   CSN: [id number] Arrival date & time: 11/10/20  1002     History Chief Complaint  Patient presents with  . Extremity Laceration    Alexia FreestoneCraig A Mentel is a 50 y.o. male with PMH/o DM, HTN, who presents for evaluation of RLE laceration that occurred just prior to ED arrival.  Patient reports that he was in his car at work and states that somebody had left a window scraper in the side.  He did not notice it in his as he was getting out of the car, it scraped against his leg, causing a laceration.  He has been able to ambulate and bear weight since this happened.  He states his tetanus is in been the last 3 to 4 years.  He denies any numbness/weakness.  He is not on blood thinners.  The history is provided by the patient.       Past Medical History:  Diagnosis Date  . Abdominal lymphadenopathy 06/21/2012   Borderline abdominal/pelvic adenopathy seen incidentally on CT scan 2011.  No change follow up 10/13  . Diabetes mellitus   . DM2 (diabetes mellitus, type 2) (HCC) 06/21/2012  . HTN (hypertension), benign 06/21/2012  . Hypertension   . Left lumbar radiculopathy 06/21/2012  . Leukocytosis 06/21/2012   High normal @ 10,000  41 poly, 46% lymphs stable over time 6// to 10/13 no anemia or thrombocytopenia  . Migraine   . Obesity     Patient Active Problem List   Diagnosis Date Noted  . Morbid obesity (HCC) 02/06/2019  . MDD (major depressive disorder), severe (HCC) 06/23/2014  . Passive suicidal ideations 06/23/2014  . OSA (obstructive sleep apnea) 06/18/2014  . Hyperlipidemia 06/18/2014  . Adjustment disorder with depressed mood 06/12/2014  . Abdominal lymphadenopathy 06/21/2012  . Leukocytosis 06/21/2012  . DM2 (diabetes mellitus, type 2) (HCC) 06/21/2012  . HTN (hypertension), benign 06/21/2012    Past Surgical History:  Procedure Laterality Date  . CARPAL TUNNEL RELEASE         Family History  Problem  Relation Age of Onset  . CAD Other   . Stroke Other   . Hypertension Mother   . Diabetes Mother   . Hypertension Father     Social History   Tobacco Use  . Smoking status: Never Smoker  . Smokeless tobacco: Never Used  Substance Use Topics  . Alcohol use: Yes    Comment: occ  . Drug use: No    Home Medications Prior to Admission medications   Medication Sig Start Date End Date Taking? Authorizing Provider  amLODipine (NORVASC) 10 MG tablet Take 1 tablet (10 mg total) by mouth every morning. 02/08/19   Bing NeighborsHarris, Kimberly S, FNP  atorvastatin (LIPITOR) 20 MG tablet Take 1 tablet (20 mg total) by mouth at bedtime. Reported on 12/04/2015 02/08/19   Bing NeighborsHarris, Kimberly S, FNP  gabapentin (NEURONTIN) 300 MG capsule Take 300 mg by mouth 2 (two) times daily as needed.    [provider]  labetalol (NORMODYNE) 200 MG tablet Take 1 tablet (200 mg total) by mouth 2 (two) times daily. 02/08/19   Bing NeighborsHarris, Kimberly S, FNP  losartan (COZAAR) 100 MG tablet Take 1 tablet (100 mg total) by mouth daily. 02/08/19   Bing NeighborsHarris, Kimberly S, FNP  metFORMIN (GLUCOPHAGE) 500 MG tablet Take 1 tablet (500 mg total) by mouth 2 (two) times daily with a meal. 02/08/19   Bing NeighborsHarris, Kimberly S, FNP  triamcinolone ointment (KENALOG) 0.5 %  Apply 1 application topically 2 (two) times daily. 07/17/19   Hall-Potvin, [place], PA-C    Allergies    Patient has no known allergies.  Review of Systems   Review of Systems  Skin: Positive for wound.  Neurological: Negative for weakness and numbness.  All other systems reviewed and are negative.   Physical Exam Updated Vital Signs BP (!) 164/81   Pulse 74   Temp 98.4 F (36.9 C) (Oral)   Resp 18   Ht 5\' 11"  (1.803 m)   Wt (!) 156.5 kg   SpO2 97%   BMI 48.12 kg/m   Physical Exam Vitals and nursing note reviewed.  Constitutional:      Appearance: He is well-developed.  HENT:     Head: Normocephalic and atraumatic.  Eyes:     General: No scleral icterus.        Right eye: No discharge.        Left eye: No discharge.     Conjunctiva/sclera: Conjunctivae normal.  Cardiovascular:     Pulses:          Dorsalis pedis pulses are 2+ on the right side and 2+ on the left side.  Pulmonary:     Effort: Pulmonary effort is normal.  Musculoskeletal:     Comments: Flexion/extension of the RLE intact. He can lift the leg and hold it against gravity. No bony tenderness noted to the knee.   Skin:    General: Skin is warm and dry.     Comments: 4 cm linear laceration noted to the lateral distal thigh. Good distal cap refill. RLE is not dusky in appearance or cool to touch.  Neurological:     Mental Status: He is alert.  Psychiatric:        Speech: Speech normal.        Behavior: Behavior normal.             ED Results / Procedures / Treatments   Labs (all labs ordered are listed, but only abnormal results are displayed) Labs Reviewed - No data to display  EKG None  Radiology DG Knee Complete 4 Views Right  Result Date: 11/10/2020 CLINICAL DATA:  Laceration to right lateral knee. EXAM: RIGHT KNEE - COMPLETE 4+ VIEW COMPARISON:  None. FINDINGS: Soft tissue injury is evident to the right lateral distal thigh above the femoral condyle. No evidence of soft tissue foreign body. Underlying bones are intact without fracture. No joint effusion. Proliferative disease of the patella. IMPRESSION: Soft tissue injury to the right lateral distal thigh without evidence of soft tissue foreign body or underlying bony abnormality. Electronically Signed   By: 11/12/2020 M.D.   On: 11/10/2020 10:47    Procedures .03/30/2022Laceration Repair  Date/Time: 11/10/2020 11:24 AM Performed by: 11/12/2020, PA-C Authorized by: Maxwell Caul, PA-C   Consent:    Consent obtained:  Verbal   Consent given by:  Patient   Risks discussed:  Infection, need for additional repair, pain, poor cosmetic result and poor wound healing   Alternatives discussed:  No treatment  and delayed treatment Universal protocol:    Procedure explained and questions answered to patient or proxy's satisfaction: yes     Relevant documents present and verified: yes     Test results available: yes     Imaging studies available: yes     Required blood products, implants, devices, and special equipment available: yes     Site/side marked: yes     Immediately prior to  procedure, a time out was called: yes     Patient identity confirmed:  Verbally with patient Anesthesia:    Anesthesia method:  Local infiltration Laceration details:    Length (cm):  4 Pre-procedure details:    Preparation:  Patient was prepped and draped in usual sterile fashion Exploration:    Hemostasis achieved with:  Direct pressure   Wound exploration: wound explored through full range of motion and entire depth of wound visualized     Wound extent: no foreign bodies/material noted   Treatment:    Area cleansed with:  Povidone-iodine and saline   Amount of cleaning:  Extensive   Irrigation solution:  Sterile saline   Irrigation method:  Syringe   Visualized foreign bodies/material removed: no   Skin repair:    Repair method:  Sutures   Suture size:  4-0   Suture material:  Prolene   Suture technique:  Simple interrupted   Number of sutures:  9 Approximation:    Approximation:  Close Repair type:    Repair type:  Simple Post-procedure details:    Dressing:  Antibiotic ointment and non-adherent dressing   Procedure completion:  Tolerated Comments:     Once the wound was anesthetized, was thoroughly extensively irrigated with sterile saline.  Using a sterile Q-tip, I probed the entirety of the wound.  There did not see to be any defect of the subcutaneous fat that would suggest any further deeper wound.  The area was repaired with Prolene.     Medications Ordered in ED Medications  lidocaine-EPINEPHrine (XYLOCAINE W/EPI) 2 %-1:200000 (PF) injection 10 mL (has no administration in time range)   bacitracin ointment (1 application Topical Given 11/10/20 1143)    ED Course  I have reviewed the triage vital signs and the nursing notes.  Pertinent labs & imaging results that were available during my care of the patient were reviewed by me and considered in my medical decision making (see chart for details).    MDM Rules/Calculators/A&P                          50 y.o. M with PMH/o DM who presents for evaluation of RLE laceration. He reports that he was at work and states that a wiper was sticking outside of the car and he scratched his leg against it. Tdap up to date. Patient is afebrile, non-toxic appearing, sitting comfortably on examination table. Vital signs reviewed and stable.  Patient is neurovascularly intact.  On exam, he has a 4 cm linear laceration on lateral distal thigh.  No bony tenderness noted over his knee.  There is full range of motion of the knee with any difficulty.  Do not suspect extension into the joint. Will obtain XR imaging, wound care.   X-ray shows soft tissue injury noted the right lateral distal thigh without evidence of soft tissue foreign body or underlying bony abnormality.  Laceration repaired as documented above.  Patient tolerated procedure well.  At this time, given the superficial nature of the wound as well as the fact that it happened just prior to ED arrival, indication for antibiotics.  Patient has a history of diabetes but states that his blood sugars continuously run in the 100s-120s.  Encouraged at home supportive care measures. At this time, patient exhibits no emergent life-threatening condition that require further evaluation in ED. Patient had ample opportunity for questions and discussion. All patient's questions were answered with full understanding. Strict return precautions  discussed. Patient expresses understanding and agreement to plan.   Portions of this note were generated with Scientist, clinical (histocompatibility and immunogenetics). Dictation errors may occur  despite best attempts at proofreading.   Final Clinical Impression(s) / ED Diagnoses Final diagnoses:  Laceration of right lower extremity, initial encounter    Rx / DC Orders ED Discharge Orders    None       Maxwell Caul, PA-C 11/10/20 1211    Melene Plan, DO 11/11/20 0710

## 2020-11-10 NOTE — ED Notes (Signed)
I put bacitracin and dry dressing on pt. Wound.

## 2020-11-10 NOTE — Discharge Instructions (Signed)
Keep the wound clean and dry for the first 24 hours. After that you may gently clean the wound with soap and water. Make sure to pat dry the wound before covering it with any dressing. You can use topical antibiotic ointment and bandage. Ice and elevate for pain relief.  ° °You can take Tylenol or Ibuprofen as directed for pain. You can alternate Tylenol and Ibuprofen every 4 hours for additional pain relief.  ° °Return to the Emergency Department, your primary care doctor, or the [facility] in 7-10 days for suture removal.  ° °Monitor closely for any signs of infection. Return to the Emergency Department for any worsening redness/swelling of the area that begins to spread, drainage from the site, worsening pain, fever or any other worsening or concerning symptoms.  ° ° °

## 2020-11-10 NOTE — ED Notes (Signed)
Pt has approx 3x0.5 in lac on right outer thigh. Bleeding is controlled.

## 2020-11-10 NOTE — ED Notes (Signed)
I put bacita

## 2020-11-10 NOTE — ED Triage Notes (Signed)
Pt arrived via [facility] from work. Pt was getting out of car and worked and scraped right leg on a scraper and received a 3 in lac from it. Pt is A&Ox4. VSS. Bleeding is controlled. The lac is bandaged upon arrival.

## 2020-11-10 NOTE — ED Notes (Signed)
Patient transported to X-ray 

## 2020-11-17 ENCOUNTER — Encounter (HOSPITAL_COMMUNITY): Payer: Self-pay

## 2020-11-17 ENCOUNTER — Ambulatory Visit (HOSPITAL_COMMUNITY)
Admission: RE | Admit: 2020-11-17 | Discharge: 2020-11-17 | Disposition: A | Discharge status: Home / Self Care | Payer: BC Managed Care – PPO | Source: Ambulatory Visit

## 2020-11-17 ENCOUNTER — Emergency Department (HOSPITAL_COMMUNITY)
Admission: EM | Admit: 2020-11-17 | Discharge: 2020-11-18 | Disposition: A | Discharge status: Home / Self Care | Payer: BC Managed Care – PPO | Attending: Emergency Medicine | Admitting: Emergency Medicine

## 2020-11-17 ENCOUNTER — Emergency Department (HOSPITAL_COMMUNITY): Payer: BC Managed Care – PPO

## 2020-11-17 ENCOUNTER — Other Ambulatory Visit: Payer: Self-pay

## 2020-11-17 VITALS — BP 187/81 | HR 79 | Temp 98.1°F | Resp 20

## 2020-11-17 DIAGNOSIS — M109 Gout, unspecified: Secondary | ICD-10-CM | POA: Diagnosis not present

## 2020-11-17 DIAGNOSIS — E785 Hyperlipidemia, unspecified: Secondary | ICD-10-CM | POA: Insufficient documentation

## 2020-11-17 DIAGNOSIS — M25461 Effusion, right knee: Secondary | ICD-10-CM | POA: Diagnosis not present

## 2020-11-17 DIAGNOSIS — M25561 Pain in right knee: Secondary | ICD-10-CM | POA: Diagnosis not present

## 2020-11-17 DIAGNOSIS — M7989 Other specified soft tissue disorders: Secondary | ICD-10-CM | POA: Diagnosis not present

## 2020-11-17 DIAGNOSIS — E1169 Type 2 diabetes mellitus with other specified complication: Secondary | ICD-10-CM | POA: Diagnosis not present

## 2020-11-17 DIAGNOSIS — M10061 Idiopathic gout, right knee: Secondary | ICD-10-CM

## 2020-11-17 DIAGNOSIS — Z7984 Long term (current) use of oral hypoglycemic drugs: Secondary | ICD-10-CM | POA: Diagnosis not present

## 2020-11-17 DIAGNOSIS — I10 Essential (primary) hypertension: Secondary | ICD-10-CM | POA: Insufficient documentation

## 2020-11-17 DIAGNOSIS — Z79899 Other long term (current) drug therapy: Secondary | ICD-10-CM | POA: Insufficient documentation

## 2020-11-17 DIAGNOSIS — S81811D Laceration without foreign body, right lower leg, subsequent encounter: Secondary | ICD-10-CM

## 2020-11-17 LAB — CBC WITH DIFFERENTIAL/PLATELET
Abs Immature Granulocytes: 0.06 10*3/uL (ref 0.00–0.07)
Basophils Absolute: 0.1 10*3/uL (ref 0.0–0.1)
Basophils Relative: 1 %
Eosinophils Absolute: 0.1 10*3/uL (ref 0.0–0.5)
Eosinophils Relative: 1 %
HCT: 44.1 % (ref 39.0–52.0)
Hemoglobin: 13.9 g/dL (ref 13.0–17.0)
Immature Granulocytes: 1 %
Lymphocytes Relative: 29 %
Lymphs Abs: 3.2 10*3/uL (ref 0.7–4.0)
MCH: 27.4 pg (ref 26.0–34.0)
MCHC: 31.5 g/dL (ref 30.0–36.0)
MCV: 87 fL (ref 80.0–100.0)
Monocytes Absolute: 0.9 10*3/uL (ref 0.1–1.0)
Monocytes Relative: 8 %
Neutro Abs: 6.6 10*3/uL (ref 1.7–7.7)
Neutrophils Relative %: 60 %
Platelets: 342 10*3/uL (ref 150–400)
RBC: 5.07 MIL/uL (ref 4.22–5.81)
RDW: 14.4 % (ref 11.5–15.5)
WBC: 11 10*3/uL — ABNORMAL HIGH (ref 4.0–10.5)
nRBC: 0 % (ref 0.0–0.2)

## 2020-11-17 LAB — COMPREHENSIVE METABOLIC PANEL
ALT: 41 U/L (ref 0–44)
AST: 39 U/L (ref 15–41)
Albumin: 3.7 g/dL (ref 3.5–5.0)
Alkaline Phosphatase: 75 U/L (ref 38–126)
Anion gap: 11 (ref 5–15)
BUN: 12 mg/dL (ref 6–20)
CO2: 27 mmol/L (ref 22–32)
Calcium: 9.2 mg/dL (ref 8.9–10.3)
Chloride: 100 mmol/L (ref 98–111)
Creatinine, Ser: 1.02 mg/dL (ref 0.61–1.24)
GFR, Estimated: >60 mL/min (ref >60)
Glucose, Bld: 98 mg/dL (ref 70–99)
Potassium: 3.6 mmol/L (ref 3.5–5.1)
Sodium: 138 mmol/L (ref 135–145)
Total Bilirubin: 0.5 mg/dL (ref 0.3–1.2)
Total Protein: 8.1 g/dL (ref 6.5–8.1)

## 2020-11-17 MED ORDER — OXYCODONE-ACETAMINOPHEN 5-325 MG PO TABS
1.0000 | ORAL_TABLET | Freq: Once | ORAL | Status: AC
  Administered 2020-11-17: 1 via ORAL
  Filled 2020-11-17: qty 1

## 2020-11-17 NOTE — ED Provider Notes (Signed)
[facility] EMERGENCY DEPARTMENT Provider Note   CSN: [id number] Arrival date & time: 11/17/20  1751     History Chief Complaint  Patient presents with  . Knee Pain    Travis Powell is a 50 y.o. male.  Patient with history of T2DM, HTN, obesity presents with right knee pain and swelling that started 3/30 and has gotten progressively worse. He reports difficulty bending the knee or bearing any weight. No fever, nausea, vomiting. He sustained a laceration to the right disstal posterolateral thigh on 3/28 but states this area is not painful, has had no drainage or swelling. He reports he tripped and fell 2 weeks ago sustaining an abrasion to the anterior right knee but there has been no pain or swelling until 5 days ago. No calf or thigh tenderness. He reports history of gout in the past but that has only affected the foot and ankle.   The history is provided by the patient. No language interpreter was used.  Knee Pain Associated symptoms: no fever        Past Medical History:  Diagnosis Date  . Abdominal lymphadenopathy 06/21/2012   Borderline abdominal/pelvic adenopathy seen incidentally on CT scan 2011.  No change follow up 10/13  . Diabetes mellitus   . DM2 (diabetes mellitus, type 2) (HCC) 06/21/2012  . HTN (hypertension), benign 06/21/2012  . Hypertension   . Left lumbar radiculopathy 06/21/2012  . Leukocytosis 06/21/2012   High normal @ 10,000  41 poly, 46% lymphs stable over time 6// to 10/13 no anemia or thrombocytopenia  . Migraine   . Obesity     Patient Active Problem List   Diagnosis Date Noted  . Morbid obesity (HCC) 02/06/2019  . MDD (major depressive disorder), severe (HCC) 06/23/2014  . Passive suicidal ideations 06/23/2014  . OSA (obstructive sleep apnea) 06/18/2014  . Hyperlipidemia 06/18/2014  . Adjustment disorder with depressed mood 06/12/2014  . Abdominal lymphadenopathy 06/21/2012  . Leukocytosis 06/21/2012  . DM2 (diabetes mellitus,  type 2) (HCC) 06/21/2012  . HTN (hypertension), benign 06/21/2012    Past Surgical History:  Procedure Laterality Date  . CARPAL TUNNEL RELEASE         Family History  Problem Relation Age of Onset  . CAD Other   . Stroke Other   . Hypertension Mother   . Diabetes Mother   . Hypertension Father     Social History   Tobacco Use  . Smoking status: Never Smoker  . Smokeless tobacco: Never Used  Substance Use Topics  . Alcohol use: Yes    Comment: occ  . Drug use: No    Home Medications Prior to Admission medications   Medication Sig Start Date End Date Taking? Authorizing Provider  amLODipine (NORVASC) 10 MG tablet Take 1 tablet (10 mg total) by mouth every morning. 02/08/19   Bing Neighbors, FNP  atorvastatin (LIPITOR) 20 MG tablet Take 1 tablet (20 mg total) by mouth at bedtime. Reported on 12/04/2015 02/08/19   Bing Neighbors, FNP  gabapentin (NEURONTIN) 300 MG capsule Take 300 mg by mouth 2 (two) times daily as needed.    [provider]  labetalol (NORMODYNE) 200 MG tablet Take 1 tablet (200 mg total) by mouth 2 (two) times daily. 02/08/19   Bing Neighbors, FNP  losartan (COZAAR) 100 MG tablet Take 1 tablet (100 mg total) by mouth daily. 02/08/19   Bing Neighbors, FNP  metFORMIN (GLUCOPHAGE) 500 MG tablet Take 1 tablet (500 mg  total) by mouth 2 (two) times daily with a meal. 02/08/19   Bing Neighbors, FNP  triamcinolone ointment (KENALOG) 0.5 % Apply 1 application topically 2 (two) times daily. 07/17/19   Hall-Potvin, [place], PA-C    Allergies    Patient has no known allergies.  Review of Systems   Review of Systems  Constitutional: Negative for chills and fever.  Gastrointestinal: Negative.  Negative for nausea.  Musculoskeletal: Positive for arthralgias.       Knee pain and swelling.  Skin: Positive for color change.  Neurological: Negative.  Negative for weakness and numbness.    Physical Exam Updated Vital Signs BP (!) 178/81 (BP  Location: Right Arm)   Pulse 74   Temp 98.3 F (36.8 C) (Oral)   Resp 16   SpO2 100%   Physical Exam Vitals and nursing note reviewed.  Constitutional:      Appearance: He is well-developed.  Pulmonary:     Effort: Pulmonary effort is normal.  Musculoskeletal:        General: Normal range of motion.     Cervical back: Normal range of motion.  Skin:    General: Skin is warm and dry.     Findings: Erythema present.  Neurological:     Mental Status: He is alert and oriented to person, place, and time.     ED Results / Procedures / Treatments   Labs (all labs ordered are listed, but only abnormal results are displayed) Labs Reviewed  CBC WITH DIFFERENTIAL/PLATELET - Abnormal; Notable for the following components:      Result Value   WBC 11.0 (*)    All other components within normal limits  COMPREHENSIVE METABOLIC PANEL    EKG None  Radiology DG Knee Complete 4 Views Right  Result Date: 11/17/2020 CLINICAL DATA:  Pain, swelling, redness, concern for infection EXAM: RIGHT KNEE - COMPLETE 4+ VIEW COMPARISON:  None FINDINGS: Slight joint space narrowing in the medial and lateral compartments. No acute bony abnormality. Specifically, no fracture, subluxation, or dislocation. Small joint effusion. IMPRESSION: No acute bony abnormality. Early arthritic changes and small joint effusion. Electronically Signed   By: Charlett Nose M.D.   On: 11/17/2020 19:02    Procedures .Joint Aspiration/Arthrocentesis  Date/Time: 11/18/2020 1:19 AM Performed by: Elpidio Anis, PA-C Authorized by: Elpidio Anis, PA-C   Consent:    Consent obtained:  Verbal   Consent given by:  Patient Universal protocol:    Procedure explained and questions answered to patient or proxy's satisfaction: yes     Immediately prior to procedure, a time out was called: yes     Patient identity confirmed:  Verbally with patient Location:    Location:  Knee   Knee:  R knee Anesthesia:    Anesthesia method:   Local infiltration   Local anesthetic:  Lidocaine 2% WITH epi Procedure details:    Preparation: Patient was prepped and draped in usual sterile fashion     Needle gauge:  18 G   Ultrasound guidance: no     Approach:  Medial   Aspirate amount:  20   Aspirate characteristics:  Blood-tinged and yellow   Steroid injected: no     Specimen collected: yes   Post-procedure details:    Dressing:  Adhesive bandage   Procedure completion:  Tolerated well, no immediate complications     Medications Ordered in ED Medications  oxyCODONE-acetaminophen (PERCOCET/ROXICET) 5-325 MG per tablet 1 tablet (has no administration in time range)    ED Course  I have reviewed the triage vital signs and the nursing notes.  Pertinent labs & imaging results that were available during my care of the patient were reviewed by me and considered in my medical decision making (see chart for details).    MDM Rules/Calculators/A&P                          Patient to ED for evaluation of pain and swelling of right knee.  No fever. The knee is warm to touch but there is no significant redness to suggest septic joint. Feel arthrocentesis is indicated for appropriate disposition.   Knee successfully tapped, labs pending. Pain is managed with single dose Percocet.   Lab evaluation of synovial fluid confirms dx gout with uric acid crystals. No evidence septic joint.   The patient is updated on results and treatment plan.     Final Clinical Impression(s) / ED Diagnoses Final diagnoses:  None   1. Gout right knee   Rx / DC Orders ED Discharge Orders    None       Elpidio Anis, Cordelia Poche 11/19/20 5732    Zadie Rhine, MD 11/24/20 [id number]

## 2020-11-17 NOTE — ED Provider Notes (Signed)
[facility]    CSN: [id number] Arrival date & time: 11/17/20  1616      History   Chief Complaint Chief Complaint  Patient presents with  . Knee Pain    HPI Travis Powell is a 50 y.o. male.   [name] presents today with a several day history of worsening leg pain. He lacerated his right leg on a window scraper when he was getting into his car on 11/10/2020 and was seen in the emergency room at which time x-ray was normal and laceration was closed with 9 simple interrupted sutures. Since the ER, patient has had worsening swelling and pain in his leg. He denies any fever, nausea, vomiting, bleeding, drainage, numbness or tingling in foot. He does report decreased range of motion particularly with flexion. Pain was severe enough that he called EMS yesterday who redressed wound. Pain is rated 8 on a 0-10 pain scale, localized to right knee without radiation, described as aching with periodic shooting pain, worse with flexion/ ambulation, no alleviating factors identified. He has tried over the counter medicine without improvement of symptoms. Reports pain is worsening and has become severe to the point he is unable to preform daily activities.      Past Medical History:  Diagnosis Date  . Abdominal lymphadenopathy 06/21/2012   Borderline abdominal/pelvic adenopathy seen incidentally on CT scan 2011.  No change follow up 10/13  . Diabetes mellitus   . DM2 (diabetes mellitus, type 2) (HCC) 06/21/2012  . HTN (hypertension), benign 06/21/2012  . Hypertension   . Left lumbar radiculopathy 06/21/2012  . Leukocytosis 06/21/2012   High normal @ 10,000  41 poly, 46% lymphs stable over time 6// to 10/13 no anemia or thrombocytopenia  . Migraine   . Obesity     Patient Active Problem List   Diagnosis Date Noted  . Morbid obesity (HCC) 02/06/2019  . MDD (major depressive disorder), severe (HCC) 06/23/2014  . Passive suicidal ideations 06/23/2014  . OSA (obstructive sleep apnea)  06/18/2014  . Hyperlipidemia 06/18/2014  . Adjustment disorder with depressed mood 06/12/2014  . Abdominal lymphadenopathy 06/21/2012  . Leukocytosis 06/21/2012  . DM2 (diabetes mellitus, type 2) (HCC) 06/21/2012  . HTN (hypertension), benign 06/21/2012    Past Surgical History:  Procedure Laterality Date  . CARPAL TUNNEL RELEASE         Home Medications    Prior to Admission medications   Medication Sig Start Date End Date Taking? Authorizing Provider  amLODipine (NORVASC) 10 MG tablet Take 1 tablet (10 mg total) by mouth every morning. 02/08/19   Bing Neighbors, FNP  atorvastatin (LIPITOR) 20 MG tablet Take 1 tablet (20 mg total) by mouth at bedtime. Reported on 12/04/2015 02/08/19   Bing Neighbors, FNP  gabapentin (NEURONTIN) 300 MG capsule Take 300 mg by mouth 2 (two) times daily as needed.    [provider]  labetalol (NORMODYNE) 200 MG tablet Take 1 tablet (200 mg total) by mouth 2 (two) times daily. 02/08/19   Bing Neighbors, FNP  losartan (COZAAR) 100 MG tablet Take 1 tablet (100 mg total) by mouth daily. 02/08/19   Bing Neighbors, FNP  metFORMIN (GLUCOPHAGE) 500 MG tablet Take 1 tablet (500 mg total) by mouth 2 (two) times daily with a meal. 02/08/19   Bing Neighbors, FNP  triamcinolone ointment (KENALOG) 0.5 % Apply 1 application topically 2 (two) times daily. 07/17/19   Hall-Potvin, [place], PA-C    Family History Family History  Problem Relation Age of Onset  . CAD Other   . Stroke Other   . Hypertension Mother   . Diabetes Mother   . Hypertension Father     Social History Social History   Tobacco Use  . Smoking status: Never Smoker  . Smokeless tobacco: Never Used  Substance Use Topics  . Alcohol use: Yes    Comment: occ  . Drug use: No     Allergies   Patient has no known allergies.   Review of Systems Review of Systems  Constitutional: Positive for activity change. Negative for appetite change, fatigue and fever.   Respiratory: Negative for cough and shortness of breath.   Cardiovascular: Negative for chest pain and leg swelling.  Gastrointestinal: Negative for abdominal pain, diarrhea, nausea and vomiting.  Musculoskeletal: Positive for arthralgias, gait problem, joint swelling and myalgias.  Skin: Positive for wound. Negative for color change.  Neurological: Negative for dizziness, weakness, light-headedness, numbness and headaches.     Physical Exam Triage Vital Signs ED Triage Vitals  Enc Vitals Group     BP 11/17/20 1716 (!) 187/81     Pulse Rate 11/17/20 1713 79     Resp 11/17/20 1713 20     Temp 11/17/20 1713 98.1 F (36.7 C)     Temp src --      SpO2 11/17/20 1713 100 %     Weight --      Height --      Head Circumference --      Peak Flow --      Pain Score 11/17/20 1712 8     Pain Loc --      Pain Edu? --      Excl. in GC? --    No data found.  Updated Vital Signs BP (!) 187/81   Pulse 79   Temp 98.1 F (36.7 C)   Resp 20   SpO2 100%   Visual Acuity Right Eye Distance:   Left Eye Distance:   Bilateral Distance:    Right Eye Near:   Left Eye Near:    Bilateral Near:     Physical Exam Vitals reviewed.  Constitutional:      General: He is awake.     Appearance: Normal appearance. He is normal weight. He is not ill-appearing.     Comments: Very pleasant male appears stated age in no acute distress.   HENT:     Head: Normocephalic and atraumatic.  Cardiovascular:     Rate and Rhythm: Normal rate and regular rhythm.     Heart sounds: No murmur heard.     Comments: Capillary refill within 2 seconds bilateral toes. Pulmonary:     Effort: Pulmonary effort is normal.     Breath sounds: Normal breath sounds. No stridor. No wheezing, rhonchi or rales.     Comments: Clear to auscultation bilaterally  Abdominal:     General: Bowel sounds are normal.     Palpations: Abdomen is soft.     Tenderness: There is no abdominal tenderness.  Musculoskeletal:     Right  knee: Swelling and effusion present. Decreased range of motion. Tenderness present.     Comments: Right knee: Moderate effusion. Significantly reduced range of motion with flexion to about 30 degrees due to pain. No erythema but warmth noted in right knee.   Skin:    Findings: Laceration present. No erythema.     Comments: Well approximated laceration with sutures noted right lateral upper leg without bleeding or  drainage. No surrounding erythema/edema or streaking noted on exam.   Neurological:     Mental Status: He is alert.  Psychiatric:        Behavior: Behavior is cooperative.      UC Treatments / Results  Labs (all labs ordered are listed, but only abnormal results are displayed) Labs Reviewed - No data to display  EKG   Radiology No results found.  Procedures Procedures (including critical care time)  Medications Ordered in UC Medications - No data to display  Initial Impression / Assessment and Plan / UC Course  I have reviewed the triage vital signs and the nursing notes.  Pertinent labs & imaging results that were available during my care of the patient were reviewed by me and considered in my medical decision making (see chart for details).      Discussed with patient possibly of empirically treating with antibiotics and getting basic labs today or going to the ER for more advanced imaging to rule out septic joint. After discussion of risks and benefits, patient is most interested in going to the ER for further evaluation given worsening pain. He is hemodynamically stable for private transport and will have his son take him to the ER immediate after visit.   Patients blood pressure was noted to be elevated on intake likely related to patient not having BP medication and being in pain. He was sent to ER for further evaluation but will try to establish patient with a PCP for ongoing management.   Final Clinical Impressions(s) / UC Diagnoses   Final diagnoses:   Acute pain of right knee  Laceration of right lower extremity, subsequent encounter     Discharge Instructions     GO TO ER for further evaluation given worsening pain and swelling.     ED Prescriptions    None     PDMP not reviewed this encounter.   Jeani Hawking, PA-C 11/17/20 1756

## 2020-11-17 NOTE — ED Triage Notes (Signed)
Emergency Medicine Provider Triage Evaluation Note  Travis Powell , a 50 y.o. male  was evaluated in triage.  Pt complains of pain in the right knee.  He got stitches recently from a wound on his right lower thigh.  He reports that he has had pain in the right knee with significant pain anytime he weight bears.  He denies any fevers..    Physical Exam  BP (!) 181/104 (BP Location: Right Arm)   Pulse 76   Temp 98.3 F (36.8 C) (Oral)   Resp 12   SpO2 98%  She is awake and alert, no distress.  Speech is clear without slurs.  Answers questions appropriately without difficulty. Limited exam right knee shows, of the portion of the wound I was able to see, sutures appear intact.  He does have a large abrasion over the front of his right knee with pain with attempts at range of motion.  Medical Decision Making  Medically screening exam initiated at 6:14 PM.  Appropriate orders placed.  Travis Powell was informed that the remainder of the evaluation will be completed by another provider, this initial triage assessment does not replace that evaluation, and the importance of remaining in the ED until their evaluation is complete.  Clinical Impression  Right knee pain   Travis Powell, [place] 11/17/20 1815

## 2020-11-17 NOTE — ED Triage Notes (Addendum)
Pt in with c/o right knee pain and swelling that he noticed a few days ago after he had sutures put in at the ED   Pt states the swelling and pain is getting worse and his knee is warm to the touch.  Denies any drainage from area

## 2020-11-17 NOTE — Discharge Instructions (Addendum)
GO TO ER for further evaluation given worsening pain and swelling.

## 2020-11-17 NOTE — ED Triage Notes (Signed)
Pt sent from UC for further eval of swelling, pain, and redness to area where pt got stitches last week at this facility after accident with a razor blade.

## 2020-11-18 LAB — SYNOVIAL CELL COUNT + DIFF, W/ CRYSTALS
Eosinophils-Synovial: 0 % (ref 0–1)
Lymphocytes-Synovial Fld: 5 % (ref 0–20)
Monocyte-Macrophage-Synovial Fluid: 10 % — ABNORMAL LOW (ref 50–90)
Neutrophil, Synovial: 85 % — ABNORMAL HIGH (ref 0–25)
WBC, Synovial: 3570 /mm3 — ABNORMAL HIGH (ref 0–200)

## 2020-11-18 MED ORDER — LIDOCAINE-EPINEPHRINE (PF) 2 %-1:200000 IJ SOLN
10.0000 mL | Freq: Once | INTRAMUSCULAR | Status: AC
  Administered 2020-11-18: 10 mL via INTRADERMAL
  Filled 2020-11-18: qty 20

## 2020-11-18 MED ORDER — OXYCODONE-ACETAMINOPHEN 5-325 MG PO TABS: 1.0000 | ORAL_TABLET | ORAL | 0 refills | Status: DC | PRN

## 2020-11-18 MED ORDER — LIDOCAINE HCL (PF) 1 % IJ SOLN: 30.0000 mL | Freq: Once | INTRAMUSCULAR | Status: DC

## 2020-11-18 MED ORDER — PREDNISONE 20 MG PO TABS
60.0000 mg | ORAL_TABLET | Freq: Once | ORAL | Status: AC
  Administered 2020-11-18: 60 mg via ORAL
  Filled 2020-11-18: qty 3

## 2020-11-18 MED ORDER — PREDNISONE 20 MG PO TABS: 60.0000 mg | ORAL_TABLET | Freq: Every day | ORAL | 0 refills | Status: DC

## 2020-11-18 NOTE — Discharge Instructions (Signed)
Take the medications as prescribed for acute gouty arthritis of the right knee. You should see your doctor in 3 days for recheck of the knee swelling and pain, as well as to have your sutures removed.   Please keep close monitoring of your blood sugar while taking prednisone as this can cause your sugar to increase.

## 2020-11-18 NOTE — ED Provider Notes (Signed)
      Zadie Rhine, MD 11/18/20 [phone]

## 2020-11-19 LAB — GLUCOSE, BODY FLUID OTHER: Glucose, Body Fluid Other: 92 mg/dL

## 2020-11-19 LAB — PROTEIN, BODY FLUID (OTHER): Total Protein, Body Fluid Other: 5 g/dL

## 2020-11-19 LAB — URIC ACID, BODY FLUID: Uric Acid Body Fluid: 6 mg/dL

## 2020-11-21 LAB — BODY FLUID CULTURE W GRAM STAIN: Culture: NO GROWTH

## 2020-11-24 ENCOUNTER — Encounter: Payer: Self-pay | Admitting: Internal Medicine

## 2020-11-24 ENCOUNTER — Ambulatory Visit: Payer: BC Managed Care – PPO | Admitting: Internal Medicine

## 2020-11-24 VITALS — BP 178/84 | HR 77 | Temp 97.9°F | Ht 71.0 in | Wt 343.6 lb

## 2020-11-24 DIAGNOSIS — E119 Type 2 diabetes mellitus without complications: Secondary | ICD-10-CM

## 2020-11-24 DIAGNOSIS — I10 Essential (primary) hypertension: Secondary | ICD-10-CM | POA: Diagnosis not present

## 2020-11-24 DIAGNOSIS — Z1159 Encounter for screening for other viral diseases: Secondary | ICD-10-CM

## 2020-11-24 DIAGNOSIS — M109 Gout, unspecified: Secondary | ICD-10-CM

## 2020-11-24 DIAGNOSIS — S71111D Laceration without foreign body, right thigh, subsequent encounter: Secondary | ICD-10-CM

## 2020-11-24 DIAGNOSIS — E782 Mixed hyperlipidemia: Secondary | ICD-10-CM

## 2020-11-24 LAB — POCT GLYCOSYLATED HEMOGLOBIN (HGB A1C): Hemoglobin A1C: 6.7 % — AB (ref 4.0–5.6)

## 2020-11-24 LAB — GLUCOSE, CAPILLARY: Glucose-Capillary: 122 mg/dL — ABNORMAL HIGH (ref 70–99)

## 2020-11-24 MED ORDER — ATORVASTATIN CALCIUM 20 MG PO TABS: 20.0000 mg | ORAL_TABLET | Freq: Every day | ORAL | 3 refills | Status: DC

## 2020-11-24 MED ORDER — LOSARTAN POTASSIUM 100 MG PO TABS: 100.0000 mg | ORAL_TABLET | Freq: Every day | ORAL | 0 refills | Status: DC

## 2020-11-24 MED ORDER — AMLODIPINE BESYLATE 10 MG PO TABS
10.0000 mg | ORAL_TABLET | ORAL | 0 refills | Status: DC
Start: 2020-11-24 — End: 2020-12-16

## 2020-11-24 MED ORDER — METFORMIN HCL 500 MG PO TABS
500.0000 mg | ORAL_TABLET | Freq: Two times a day (BID) | ORAL | 0 refills | Status: DC
Start: 2020-11-24 — End: 2020-12-03

## 2020-11-24 NOTE — Assessment & Plan Note (Signed)
Lab Results  Component Value Date   HGBA1C 6.7 (H) 02/08/2019   Presents for f/u management of diabetes. Previously on metformin although has not taken recently due to lack of primary care. Mentions some polyuria. Denies nausea, vomiting, diarrhea.  A/p Chart review shows previously well controlled diabetes on metformin. Suspect will be uncontrolled in setting of medication non-adherence.  - Restart metformin 500mg  BID, titrate up to 1000mg  - Hgb a1c

## 2020-11-24 NOTE — Assessment & Plan Note (Signed)
Cut by a razor at work while getting out of the car. Seen by urgent care 2 weeks ago and given stitches. Mentions he was told to keep the stitches in for now. Denies drainage, dehiscence,   A/P Present with well healed laceration scar. No drainage. Appears well healed.  - Sutures removed in office

## 2020-11-24 NOTE — Assessment & Plan Note (Signed)
Travis Powell is a 50 yo M w/ PMH of recurrent gout, morbid obesity, T2DM, HTN presenting to [facility] to establish care after recent visit to urgent care for knee pain. He mentions couple days after receiving work-place laceration of his right leg, he developed acute right knee pain. He mentions pain was so severe that he called EMS, and he was told he had gout. He received a course of prednisone which he has been taking as prescribed with some improvement in his knee pain.  A/P Exam consistent with acute gout of R knee. Chart review shows evidence of crystals in synovial fluids. Has hx of recurrent gout. Unable to check baseline uric acid level during current gout flare. Will need to start allopurinol once acute flare resolves.  - RTC in 2 weeks for lab check - Finish prednisone course - Can start allopurinol at next visit if able to get baseline uric acid level - Discussed need to avoid meats, beer, and other high protein foods

## 2020-11-24 NOTE — Progress Notes (Signed)
CC: Knee pain  HPI: Travis Powell is a 50 y.o. with PMH listed below presenting with complaint of knee pain. Please see problem based assessment and plan for further details.  Social history Works in Interior and spatial designer cars, Lives with wife, has multiple adult children and grandchildren. Denies alcohol, tobacco, illicit substance use  Family history Hypertension in both mother and father, no malignancy in family  Past Medical History:  Diagnosis Date  . Abdominal lymphadenopathy 06/21/2012   Borderline abdominal/pelvic adenopathy seen incidentally on CT scan 2011.  No change follow up 10/13  . Diabetes mellitus   . DM2 (diabetes mellitus, type 2) (HCC) 06/21/2012  . HTN (hypertension), benign 06/21/2012  . Hypertension   . Left lumbar radiculopathy 06/21/2012  . Leukocytosis 06/21/2012   High normal @ 10,000  41 poly, 46% lymphs stable over time 6// to 10/13 no anemia or thrombocytopenia  . Migraine   . Obesity    Review of Systems: Review of Systems  Constitutional: Negative for chills, fever and malaise/fatigue.  Eyes: Negative for blurred vision.  Respiratory: Negative for shortness of breath.   Cardiovascular: Negative for chest pain, palpitations and leg swelling.  Gastrointestinal: Negative for constipation, diarrhea, nausea and vomiting.  Genitourinary: Positive for frequency.  All other systems reviewed and are negative.    Physical Exam: Vitals:   11/24/20 1050 11/24/20 1127  BP: (!) 184/101 (!) 178/84  Pulse: 77   Temp: 97.9 F (36.6 C)   TempSrc: Oral   SpO2: 100%   Weight: (!) 343 lb 9.6 oz (155.9 kg)   Height: 5\' 11"  (1.803 m)    Gen: Well-developed, well nourished, NAD HEENT: NCAT head, hearing intact CV: RRR, S1, S2 normal Pulm: CTAB, No rales, no wheezes Extm: Warm right knee with tenderness to palpation on medial aspect of patella, mild effusion felt with warmth Skin: Dry, Warm, normal turgor, R knee well-healing anterior wound, R lateral thigh with  well-healed laceration with sutures in place w/o dehiscence, drainage, or surrounding erythema  Assessment & Plan:   HTN (hypertension), benign BP Readings from Last 3 Encounters:  11/24/20 (!) 178/84  11/18/20 133/61  11/17/20 (!) 187/81   Above goal. Mentions previously on amlodipine and losartan but has not had any recently as he does not have a pcp and gets refills from encounters with urgent care. Denies headache, blurry vision, chest pain.  A/P Uncontrolled htn, complicated by lack of primary care and inability to get regular refills. Recent blood work shows no renal impairment or hyperkalemia.[  - Restart amlodipine 10mg  daily, losartan 100mg  daily - F/u in 2 weeks  Thigh laceration, right, subsequent encounter Cut by a razor at work while getting out of the car. Seen by urgent care 2 weeks ago and given stitches. Mentions he was told to keep the stitches in for now. Denies drainage, dehiscence,   A/P Present with well healed laceration scar. No drainage. Appears well healed.  - Sutures removed in office  Hyperlipidemia Unable to find lipid panel. Previously prescribed atorvastatin 20mg . Has not been taking due to not having established care with pcp.  - Restart atorvastatin 20mg  daily  DM2 (diabetes mellitus, type 2) Lab Results  Component Value Date   HGBA1C 6.7 (H) 02/08/2019   Presents for f/u management of diabetes. Previously on metformin although has not taken recently due to lack of primary care. Mentions some polyuria. Denies nausea, vomiting, diarrhea.  A/p Chart review shows previously well controlled diabetes on metformin. Suspect will be uncontrolled in  setting of medication non-adherence.  - Restart metformin 500mg  BID, titrate up to 1000mg  - Hgb a1c  Acute gout of knee Travis Powell is a 50 yo M w/ PMH of recurrent gout, morbid obesity, T2DM, HTN presenting to [facility] to establish care after recent visit to urgent care for knee pain. He mentions couple days  after receiving work-place laceration of his right leg, he developed acute right knee pain. He mentions pain was so severe that he called EMS, and he was told he had gout. He received a course of prednisone which he has been taking as prescribed with some improvement in his knee pain.  A/P Exam consistent with acute gout of R knee. Chart review shows evidence of crystals in synovial fluids. Has hx of recurrent gout. Unable to check baseline uric acid level during current gout flare. Will need to start allopurinol once acute flare resolves.  - RTC in 2 weeks for lab check - Finish prednisone course - Can start allopurinol at next visit if able to get baseline uric acid level - Discussed need to avoid meats, beer, and other high protein foods   Patient discussed with Dr. 44  -[facility], PGY3 [facility] Internal Medicine Pager: [phone]

## 2020-11-24 NOTE — Assessment & Plan Note (Addendum)
BP Readings from Last 3 Encounters:  11/24/20 (!) 178/84  11/18/20 133/61  11/17/20 (!) 187/81   Above goal. Mentions previously on amlodipine and losartan but has not had any recently as he does not have a pcp and gets refills from encounters with urgent care. Denies headache, blurry vision, chest pain.  A/P Uncontrolled htn, complicated by lack of primary care and inability to get regular refills. Recent blood work shows no renal impairment or hyperkalemia.[  - Restart amlodipine 10mg  daily, losartan 100mg  daily - F/u in 2 weeks

## 2020-11-24 NOTE — Assessment & Plan Note (Signed)
Unable to find lipid panel. Previously prescribed atorvastatin 20mg . Has not been taking due to not having established care with pcp.  - Restart atorvastatin 20mg  daily

## 2020-11-24 NOTE — Patient Instructions (Addendum)
Thank you for allowing [place] to provide your care today. Today we discussed your blood pressure, diabetes, and knee pain    I have ordered hgb a1c, lipid panel labs for you. I will call if any are abnormal.    Today we made no changes to your medications.    Please follow-up in 2 weeks.    Should you have any questions or concerns please call the internal medicine clinic at [phone].     Gout  Gout is painful swelling of your joints. Gout is a type of arthritis. It is caused by having too much uric acid in your body. Uric acid is a chemical that is made when your body breaks down substances called purines. If your body has too much uric acid, sharp crystals can form and build up in your joints. This causes pain and swelling. Gout attacks can happen quickly and be very painful (acute gout). Over time, the attacks can affect more joints and happen more often (chronic gout). What are the causes?  Too much uric acid in your blood. This can happen because: ? Your kidneys do not remove enough uric acid from your blood. ? Your body makes too much uric acid. ? You eat too many foods that are high in purines. These foods include organ meats, some seafood, and beer.  Trauma or stress. What increases the risk?  Having a family history of gout.  Being male and middle-aged.  Being male and having gone through menopause.  Being very overweight (obese).  Drinking alcohol, especially beer.  Not having enough water in the body (being dehydrated).  Losing weight too quickly.  Having an organ transplant.  Having lead poisoning.  Taking certain medicines.  Having kidney disease.  Having a skin condition called psoriasis. What are the signs or symptoms? An attack of acute gout usually happens in just one joint. The most common place is the big toe. Attacks often start at night. Other joints that may be affected include joints of the feet, ankle, knee, fingers, wrist, or elbow. Symptoms  of an attack may include:  Very bad pain.  Warmth.  Swelling.  Stiffness.  Shiny, red, or purple skin.  Tenderness. The affected joint may be very painful to touch.  Chills and fever. Chronic gout may cause symptoms more often. More joints may be involved. You may also have white or yellow lumps (tophi) on your hands or feet or in other areas near your joints.   How is this treated?  Treatment for this condition has two phases: treating an acute attack and preventing future attacks.  Acute gout treatment may include: ? NSAIDs. ? Steroids. These are taken by mouth or injected into a joint. ? Colchicine. This medicine relieves pain and swelling. It can be given by mouth or through an IV tube.  Preventive treatment may include: ? Taking small doses of NSAIDs or colchicine daily. ? Using a medicine that reduces uric acid levels in your blood. ? Making changes to your diet. You may need to see a food expert (dietitian) about what to eat and drink to prevent gout. Follow these instructions at home: During a gout attack  If told, put ice on the painful area: ? Put ice in a plastic bag. ? Place a towel between your skin and the bag. ? Leave the ice on for 20 minutes, 2-3 times a day.  Raise (elevate) the painful joint above the level of your heart as often as you can.  Rest  the joint as much as possible. If the joint is in your leg, you may be given crutches.  Follow instructions from your doctor about what you cannot eat or drink.   Avoiding future gout attacks  Eat a low-purine diet. Avoid foods and drinks such as: ? Liver. ? Kidney. ? Anchovies. ? Asparagus. ? Herring. ? Mushrooms. ? Mussels. ? Beer.  Stay at a healthy weight. If you want to lose weight, talk with your doctor. Do not lose weight too fast.  Start or continue an exercise plan as told by your doctor. Eating and drinking  Drink enough fluids to keep your pee (urine) pale yellow.  If you drink  alcohol: ? Limit how much you use to:  0-1 drink a day for women.  0-2 drinks a day for men. ? Be aware of how much alcohol is in your drink. In the [place] one drink equals one 12 oz bottle of beer (355 mL), one 5 oz glass of wine (148 mL), or one 1 oz glass of hard liquor (44 mL). General instructions  Take over-the-counter and prescription medicines only as told by your doctor.  Do not drive or use heavy machinery while taking prescription pain medicine.  Return to your normal activities as told by your doctor. Ask your doctor what activities are safe for you.  Keep all follow-up visits as told by your doctor. This is important. Contact a doctor if:  You have another gout attack.  You still have symptoms of a gout attack after 10 days of treatment.  You have problems (side effects) because of your medicines.  You have chills or a fever.  You have burning pain when you pee (urinate).  You have pain in your lower back or belly. Get help right away if:  You have very bad pain.  Your pain cannot be controlled.  You cannot pee. Summary  Gout is painful swelling of the joints.  The most common site of pain is the big toe, but it can affect other joints.  Medicines and avoiding some foods can help to prevent and treat gout attacks. This information is not intended to replace advice given to you by your health care provider. Make sure you discuss any questions you have with your health care provider. Document Revised: 02/22/2018 Document Reviewed: 02/22/2018 Elsevier Patient Education  2021 [employer].

## 2020-11-25 LAB — LIPID PANEL
Chol/HDL Ratio: 4.5 ratio (ref 0.0–5.0)
Cholesterol, Total: 191 mg/dL (ref 100–199)
HDL: 42 mg/dL (ref >39)
LDL Chol Calc (NIH): 134 mg/dL — ABNORMAL HIGH (ref 0–99)
Triglycerides: 81 mg/dL (ref 0–149)
VLDL Cholesterol Cal: 15 mg/dL (ref 5–40)

## 2020-11-25 LAB — HEPATITIS C ANTIBODY: Hep C Virus Ab: <0.1 s/co ratio (ref 0.0–0.9)

## 2020-11-25 NOTE — Progress Notes (Signed)
Internal Medicine Clinic Attending  Case discussed with Dr. Lee  At the time of the visit.  We reviewed the resident's history and exam and pertinent patient test results.  I agree with the assessment, diagnosis, and plan of care documented in the resident's note.    

## 2020-12-01 ENCOUNTER — Other Ambulatory Visit: Payer: Self-pay | Admitting: Internal Medicine

## 2020-12-01 DIAGNOSIS — E119 Type 2 diabetes mellitus without complications: Secondary | ICD-10-CM

## 2020-12-05 ENCOUNTER — Telehealth: Payer: Self-pay | Admitting: Internal Medicine

## 2020-12-05 NOTE — Telephone Encounter (Signed)
Spoke with Travis Powell regarding his lab results and to follow up on his knee pain and blood pressure. He mentions that he was able to establish care with occupational health and is getting further work-up for his pain. He mentions at the occupational health's office that his blood pressure was at goal. He mentions that he is in the process of getting set-up with bariatric office for surgery and mentions having lot of appointments to go to. He mentions he will reschedule his upcoming appointment to a more appropriate time for him. All other questions and concerns addressed.

## 2020-12-08 ENCOUNTER — Encounter: Payer: BC Managed Care – PPO | Admitting: Internal Medicine

## 2020-12-16 ENCOUNTER — Other Ambulatory Visit: Payer: Self-pay | Admitting: Internal Medicine

## 2020-12-16 DIAGNOSIS — I10 Essential (primary) hypertension: Secondary | ICD-10-CM

## 2020-12-23 ENCOUNTER — Encounter: Payer: BC Managed Care – PPO | Admitting: Student

## 2020-12-23 ENCOUNTER — Telehealth: Payer: Self-pay | Admitting: *Deleted

## 2020-12-23 NOTE — Telephone Encounter (Signed)
CALLED PATIENT LEFT VOICE MESSAGE FOR PATIENT TO CALL CLINIC TO RESCHEDULE THIS MISSED APPOINTMENT @ [phone].

## 2020-12-31 ENCOUNTER — Other Ambulatory Visit: Payer: Self-pay | Admitting: Student

## 2020-12-31 DIAGNOSIS — I10 Essential (primary) hypertension: Secondary | ICD-10-CM

## 2021-01-11 ENCOUNTER — Other Ambulatory Visit: Payer: Self-pay | Admitting: Student

## 2021-01-11 DIAGNOSIS — I10 Essential (primary) hypertension: Secondary | ICD-10-CM

## 2021-02-17 ENCOUNTER — Encounter: Payer: Self-pay | Admitting: *Deleted

## 2021-02-19 ENCOUNTER — Other Ambulatory Visit: Payer: Self-pay | Admitting: Internal Medicine

## 2021-02-19 DIAGNOSIS — E782 Mixed hyperlipidemia: Secondary | ICD-10-CM

## 2021-03-06 ENCOUNTER — Encounter (HOSPITAL_COMMUNITY): Payer: Self-pay

## 2021-03-06 ENCOUNTER — Emergency Department (HOSPITAL_COMMUNITY): Payer: BC Managed Care – PPO

## 2021-03-06 ENCOUNTER — Emergency Department (HOSPITAL_COMMUNITY)
Admission: EM | Admit: 2021-03-06 | Discharge: 2021-03-06 | Disposition: A | Discharge status: Home / Self Care | Payer: BC Managed Care – PPO | Attending: Emergency Medicine | Admitting: Emergency Medicine

## 2021-03-06 ENCOUNTER — Other Ambulatory Visit: Payer: Self-pay

## 2021-03-06 DIAGNOSIS — I10 Essential (primary) hypertension: Secondary | ICD-10-CM | POA: Diagnosis not present

## 2021-03-06 DIAGNOSIS — R109 Unspecified abdominal pain: Secondary | ICD-10-CM | POA: Diagnosis not present

## 2021-03-06 DIAGNOSIS — I700 Atherosclerosis of aorta: Secondary | ICD-10-CM | POA: Diagnosis not present

## 2021-03-06 DIAGNOSIS — E785 Hyperlipidemia, unspecified: Secondary | ICD-10-CM | POA: Diagnosis not present

## 2021-03-06 DIAGNOSIS — E1169 Type 2 diabetes mellitus with other specified complication: Secondary | ICD-10-CM | POA: Insufficient documentation

## 2021-03-06 DIAGNOSIS — K760 Fatty (change of) liver, not elsewhere classified: Secondary | ICD-10-CM | POA: Diagnosis not present

## 2021-03-06 DIAGNOSIS — K5790 Diverticulosis of intestine, part unspecified, without perforation or abscess without bleeding: Secondary | ICD-10-CM | POA: Diagnosis not present

## 2021-03-06 DIAGNOSIS — Z79899 Other long term (current) drug therapy: Secondary | ICD-10-CM | POA: Diagnosis not present

## 2021-03-06 DIAGNOSIS — N201 Calculus of ureter: Secondary | ICD-10-CM | POA: Insufficient documentation

## 2021-03-06 DIAGNOSIS — N23 Unspecified renal colic: Secondary | ICD-10-CM | POA: Diagnosis not present

## 2021-03-06 DIAGNOSIS — Z7984 Long term (current) use of oral hypoglycemic drugs: Secondary | ICD-10-CM | POA: Diagnosis not present

## 2021-03-06 DIAGNOSIS — R1031 Right lower quadrant pain: Secondary | ICD-10-CM | POA: Diagnosis not present

## 2021-03-06 DIAGNOSIS — N2889 Other specified disorders of kidney and ureter: Secondary | ICD-10-CM | POA: Diagnosis not present

## 2021-03-06 HISTORY — DX: Sleep apnea, unspecified: G47.30

## 2021-03-06 LAB — COMPREHENSIVE METABOLIC PANEL
ALT: 24 U/L (ref 0–44)
AST: 26 U/L (ref 15–41)
Albumin: 4.4 g/dL (ref 3.5–5.0)
Alkaline Phosphatase: 71 U/L (ref 38–126)
Anion gap: 10 (ref 5–15)
BUN: 15 mg/dL (ref 6–20)
CO2: 25 mmol/L (ref 22–32)
Calcium: 9.1 mg/dL (ref 8.9–10.3)
Chloride: 102 mmol/L (ref 98–111)
Creatinine, Ser: 1.62 mg/dL — ABNORMAL HIGH (ref 0.61–1.24)
GFR, Estimated: 51 mL/min — ABNORMAL LOW (ref >60)
Glucose, Bld: 96 mg/dL (ref 70–99)
Potassium: 3.4 mmol/L — ABNORMAL LOW (ref 3.5–5.1)
Sodium: 137 mmol/L (ref 135–145)
Total Bilirubin: 1.1 mg/dL (ref 0.3–1.2)
Total Protein: 8.5 g/dL — ABNORMAL HIGH (ref 6.5–8.1)

## 2021-03-06 LAB — URINALYSIS, ROUTINE W REFLEX MICROSCOPIC
Bacteria, UA: NONE SEEN
Bilirubin Urine: NEGATIVE
Glucose, UA: NEGATIVE mg/dL
Ketones, ur: NEGATIVE mg/dL
Leukocytes,Ua: NEGATIVE
Nitrite: NEGATIVE
Protein, ur: 30 mg/dL — AB
Specific Gravity, Urine: 1.015 (ref 1.005–1.030)
pH: 7 (ref 5.0–8.0)

## 2021-03-06 LAB — CBC
HCT: 46.1 % (ref 39.0–52.0)
Hemoglobin: 14.4 g/dL (ref 13.0–17.0)
MCH: 27.5 pg (ref 26.0–34.0)
MCHC: 31.2 g/dL (ref 30.0–36.0)
MCV: 88 fL (ref 80.0–100.0)
Platelets: 284 10*3/uL (ref 150–400)
RBC: 5.24 MIL/uL (ref 4.22–5.81)
RDW: 15.4 % (ref 11.5–15.5)
WBC: 10.2 10*3/uL (ref 4.0–10.5)
nRBC: 0 % (ref 0.0–0.2)

## 2021-03-06 LAB — LIPASE, BLOOD: Lipase: 35 U/L (ref 11–51)

## 2021-03-06 MED ORDER — HYDROMORPHONE HCL 1 MG/ML IJ SOLN
1.0000 mg | Freq: Once | INTRAMUSCULAR | Status: AC
  Administered 2021-03-06: 1 mg via INTRAVENOUS
  Filled 2021-03-06: qty 1

## 2021-03-06 MED ORDER — OXYCODONE-ACETAMINOPHEN 5-325 MG PO TABS: 1.0000 | ORAL_TABLET | Freq: Four times a day (QID) | ORAL | 0 refills | Status: DC | PRN

## 2021-03-06 MED ORDER — TAMSULOSIN HCL 0.4 MG PO CAPS: 0.4000 mg | ORAL_CAPSULE | Freq: Every day | ORAL | 0 refills | Status: DC

## 2021-03-06 MED ORDER — AMLODIPINE BESYLATE 5 MG PO TABS
10.0000 mg | ORAL_TABLET | Freq: Once | ORAL | Status: AC
  Administered 2021-03-06: 10 mg via ORAL
  Filled 2021-03-06: qty 2

## 2021-03-06 NOTE — ED Provider Notes (Signed)
[facility]-EMERGENCY DEPT Provider Note   CSN: [id number] Arrival date & time: 03/06/21  [id number]     History Chief Complaint  Patient presents with   Abdominal Pain    Travis Powell is a 50 y.o. male.  The history is provided by the patient and medical records.  Abdominal Pain Travis Powell is a 50 y.o. male who presents to the Emergency Department complaining of abdominal pain.  He presents to the ED complaining of sharp, RLQ abdominal pain that radiates to his low back.  He had one episode of emesis two days ago.  No fever, nausea, diarrhea, dysuria.  Pain is constant in nature. No prior similar symptoms. He did not take his blood pressure medications today    Past Medical History:  Diagnosis Date   Abdominal lymphadenopathy 06/21/2012   Borderline abdominal/pelvic adenopathy seen incidentally on CT scan 2011.  No change follow up 10/13   Diabetes mellitus    DM2 (diabetes mellitus, type 2) (HCC) 06/21/2012   HTN (hypertension), benign 06/21/2012   Hypertension    Left lumbar radiculopathy 06/21/2012   Leukocytosis 06/21/2012   High normal @ 10,000  41 poly, 46% lymphs stable over time 6// to 10/13 no anemia or thrombocytopenia   Migraine    Obesity    Sleep apnea    uses CPAP at night    Patient Active Problem List   Diagnosis Date Noted   Acute gout of knee 11/24/2020   Thigh laceration, right, subsequent encounter 11/24/2020   Morbid obesity (HCC) 02/06/2019   OSA (obstructive sleep apnea) 06/18/2014   Hyperlipidemia 06/18/2014   Abdominal lymphadenopathy 06/21/2012   DM2 (diabetes mellitus, type 2) (HCC) 06/21/2012   HTN (hypertension), benign 06/21/2012    Past Surgical History:  Procedure Laterality Date   CARPAL TUNNEL RELEASE         Family History  Problem Relation Age of Onset   CAD Other    Stroke Other    Hypertension Mother    Diabetes Mother    Hypertension Father     Social History   Tobacco Use   Smoking  status: Never   Smokeless tobacco: Never  Vaping Use   Vaping Use: Never used  Substance Use Topics   Alcohol use: Yes    Comment: occ   Drug use: No    Home Medications Prior to Admission medications   Medication Sig Start Date End Date Taking? Authorizing Provider  amLODipine (NORVASC) 10 MG tablet TAKE 1 TABLET (10 MG TOTAL) BY MOUTH EVERY MORNING. 01/15/21  Yes Evlyn KannerBraswell, Phillip, MD  atorvastatin (LIPITOR) 20 MG tablet TAKE 1 TABLET (20 MG TOTAL) BY MOUTH AT BEDTIME. REPORTED ON 12/04/2015 Patient taking differently: Take 20 mg by mouth at bedtime. 02/20/21  Yes Katsadouros, Vasilios, MD  ibuprofen (ADVIL) 800 MG tablet Take 800 mg by mouth every 8 (eight) hours as needed for mild pain.   Yes [provider]  losartan (COZAAR) 100 MG tablet TAKE 1 TABLET BY MOUTH EVERY DAY Patient taking differently: Take 100 mg by mouth daily. 01/15/21  Yes Evlyn KannerBraswell, Phillip, MD  metFORMIN (GLUCOPHAGE) 500 MG tablet TAKE 1 TABLET BY MOUTH 2 TIMES DAILY WITH A MEAL. Patient taking differently: Take 500 mg by mouth 2 (two) times daily with a meal. 12/03/20  Yes Aslam, Sadia, MD  oxyCODONE-acetaminophen (PERCOCET/ROXICET) 5-325 MG tablet Take 1 tablet by mouth every 6 (six) hours as needed for severe pain. 03/06/21  Yes Tilden Fossaees, Maxten Shuler, MD  tamsulosin [facility](FLOMAX)  0.4 MG CAPS capsule Take 1 capsule (0.4 mg total) by mouth daily. 03/06/21  Yes Tilden Fossa, MD  predniSONE (DELTASONE) 20 MG tablet Take 3 tablets (60 mg total) by mouth daily. Patient not taking: No sig reported 11/18/20   Elpidio Anis, PA-C    Allergies    Patient has no known allergies.  Review of Systems   Review of Systems  Gastrointestinal:  Positive for abdominal pain.  All other systems reviewed and are negative.  Physical Exam Updated Vital Signs BP (!) 173/95   Pulse 77   Temp 98.4 F (36.9 C) (Oral)   Resp 18   Ht 5\' 11"  (1.803 m)   Wt (!) 149.7 kg   SpO2 100%   BMI 46.03 kg/m   Physical Exam Vitals and nursing  note reviewed.  Constitutional:      Appearance: He is well-developed.  HENT:     Head: Normocephalic and atraumatic.  Cardiovascular:     Rate and Rhythm: Normal rate and regular rhythm.  Pulmonary:     Effort: Pulmonary effort is normal. No respiratory distress.  Abdominal:     Palpations: Abdomen is soft.     Tenderness: There is no abdominal tenderness. There is no guarding or rebound.  Musculoskeletal:        General: No tenderness.  Skin:    General: Skin is warm and dry.  Neurological:     Mental Status: He is alert and oriented to person, place, and time.  Psychiatric:        Behavior: Behavior normal.    ED Results / Procedures / Treatments   Labs (all labs ordered are listed, but only abnormal results are displayed) Labs Reviewed  COMPREHENSIVE METABOLIC PANEL - Abnormal; Notable for the following components:      Result Value   Potassium 3.4 (*)    Creatinine, Ser 1.62 (*)    Total Protein 8.5 (*)    GFR, Estimated 51 (*)    All other components within normal limits  URINALYSIS, ROUTINE W REFLEX MICROSCOPIC - Abnormal; Notable for the following components:   Hgb urine dipstick SMALL (*)    Protein, ur 30 (*)    All other components within normal limits  LIPASE, BLOOD  CBC    EKG None  Radiology CT Renal Stone Study  Result Date: 03/06/2021 CLINICAL DATA:  Flank pain.  Kidney stones suspected. EXAM: CT ABDOMEN AND PELVIS WITHOUT CONTRAST TECHNIQUE: Multidetector CT imaging of the abdomen and pelvis was performed following the standard protocol without IV contrast. COMPARISON:  06/05/2012 FINDINGS: Lower chest: Subsegmental atelectasis noted right base. Hepatobiliary: The liver shows diffusely decreased attenuation suggesting fat deposition. No focal abnormality in the liver on this study without intravenous contrast. There is no evidence for gallstones, gallbladder wall thickening, or pericholecystic fluid. No intrahepatic or extrahepatic biliary dilation.  Pancreas: No focal mass lesion. No dilatation of the main duct. No intraparenchymal cyst. No peripancreatic edema. Spleen: No splenomegaly. No focal mass lesion. Adrenals/Urinary Tract: No adrenal nodule or mass. Mild fullness noted right intrarenal collecting system with 4 x 4 x 6 mm stone at or just distal to the right UPJ. There is mild right perinephric and peripelvic edema. Right ureter otherwise unremarkable. No stones in the left kidney or ureter. Rim calcified left renal lesion measures 5.1 x 5.1 cm today. Rim calcification is new in the interval, but this lesion measured 5.3 x 5.3 cm on the study from 9 years ago compatible with benign etiology. The urinary  bladder appears normal for the degree of distention. Stomach/Bowel: Stomach is unremarkable. No gastric wall thickening. No evidence of outlet obstruction. Duodenum is normally positioned as is the ligament of Treitz. No small bowel wall thickening. No small bowel dilatation. The terminal ileum is normal. The appendix is normal. No gross colonic mass. No colonic wall thickening. Mild diverticular changes noted on the left. Vascular/Lymphatic: There is mild atherosclerotic calcification of the abdominal aorta without aneurysm. No gastrohepatic or hepatoduodenal ligament lymphadenopathy. Upper normal lymph nodes are seen along the distal aorta and along the common iliac vessels bilaterally. Distal right para-aortic node measuring 12 mm short axis was 10 mm short axis on the previous study and 10 mm short axis on a study from 12/08/2009. 14 mm short axis right external iliac node on image 70/2 was 11 mm short axis on the previous exam and 11 mm short axis on 12/08/2009. 15 mm short axis right external iliac node in the pelvic sidewall on 86/2 was 15 mm short axis on the study from 12/08/2009. Similar stable left external iliac node on the same image. Reproductive: The prostate gland and seminal vesicles are unremarkable. Other: No intraperitoneal free  fluid. Musculoskeletal: No worrisome lytic or sclerotic osseous abnormality. IMPRESSION: 1. 4 x 4 x 6 mm stone at or just distal to the right UPJ causes mild fullness of the right intrarenal collecting system and mild right perinephric and peripelvic edema. 2. Rim calcified left renal lesion measures 5.1 x 5.1 cm today. Rim calcification is new in the interval, but this lesion measured 5.3 x 5.3 cm on the study from 9 years ago compatible with benign etiology. 3. Mild distal para-aortic and bilateral pelvic sidewall lymphadenopathy. These lymph nodes have been prominent on studies, dating back to 2011 although some of these are minimally increased from a 2013 exam. Given the relative lack of change over such a long interval, these are most likely benign/reactive. 4. Hepatic steatosis. 5. Aortic Atherosclerosis (ICD10-I70.0). Electronically Signed   By: Kennith Center M.D.   On: 03/06/2021 12:49    Procedures Procedures   Medications Ordered in ED Medications  amLODipine (NORVASC) tablet 10 mg (10 mg Oral Given 03/06/21 2009)  HYDROmorphone (DILAUDID) injection 1 mg (1 mg Intravenous Given 03/06/21 2059)    ED Course  I have reviewed the triage vital signs and the nursing notes.  Pertinent labs & imaging results that were available during my care of the patient were reviewed by me and considered in my medical decision making (see chart for details).    MDM Rules/Calculators/A&P                          patient here for evaluation of right lower quadrant abdominal pain present for the last several days. He has no significant tenderness on examination. CT scan is significant for a right proximal ureteral stone. UA is not consistent with UTI. BMP with mild elevation is renal function compared to priors. Discussed with patient findings of studies. His pain is well-controlled in the department. Discussed with on-call urologist. Plan to discharge home with close urology follow-up and return  precautions.  discussed with patient incidental findings of renal lesion, fatty liver and atherosclerosis.  Final Clinical Impression(s) / ED Diagnoses Final diagnoses:  Ureteral colic    Rx / DC Orders ED Discharge Orders          Ordered    tamsulosin (FLOMAX) 0.4 MG CAPS capsule  Daily  03/06/21 2149    oxyCODONE-acetaminophen (PERCOCET/ROXICET) 5-325 MG tablet  Every 6 hours PRN        03/06/21 2149             Tilden Fossa, MD 03/06/21 2231

## 2021-03-06 NOTE — ED Provider Notes (Signed)
Emergency Medicine Provider Triage Evaluation Note  Travis Powell , a 50 y.o. male  was evaluated in triage.  Pt complains of abdominal pain and flank pain.  Review of Systems  Positive: Abdominal pain, flank pain, nausea, increased urinary frequency Negative: Fevers, chills, anorexia, chest pain, shortness of breath, dysuria, hematuria  Physical Exam  BP (!) 215/105 (BP Location: Left Arm) Comment: patient states he has not taken his medication today.  Pulse 70   Temp 98.4 F (36.9 C) (Oral)   Resp 16   Ht 5\' 11"  (1.803 m)   Wt (!) 149.7 kg   SpO2 98%   BMI 46.03 kg/m  Gen:   Awake, no distress, obese Resp:  Normal effort  MSK:   Moves extremities without difficulty  Other:  No CVA tenderness, abdomen distended, soft, mild right-sided tenderness to palpation  Medical Decision Making  Medically screening exam initiated at 11:08 AM.  Appropriate orders placed.  [name] was informed that the remainder of the evaluation will be completed by another provider, this initial triage assessment does not replace that evaluation, and the importance of remaining in the ED until their evaluation is complete.  50 year old male presenting to the Emergency Department with a complaint of sharp shooting abdominal pain that is an intermittent over the past few days.  No history of nephrolithiasis with thinks he may have a kidney stone.  No dysuria or hematuria.  Some increased urinary frequency.  No testicular pain or swelling.    44, MD 03/06/21 1114

## 2021-03-06 NOTE — Discharge Instructions (Addendum)
You have a 6 x 4 x 4 mm stone in your right ureter. If you develop fevers, uncontrolled pain or cannot urinate please present for reevaluation to the emergency department. Call the office on Monday so you can have close follow-up with a urologist.

## 2021-03-06 NOTE — ED Triage Notes (Signed)
Patient c/o  RLQ abdominal pain x 2 days. Patient states one episode of nausea, but denies V/D.

## 2021-03-07 ENCOUNTER — Telehealth: Payer: Self-pay | Admitting: Student

## 2021-03-07 NOTE — Telephone Encounter (Signed)
I received a phone call from the [facility] emergency department Dr. Madilyn Hook regarding this patient.  He has a 6 mm proximal ureteral stone dilated with some hydronephrosis.  His pain is well controlled in the emergency department.  Of note he also has a 5 cm rim calcified renal lesion which is stable in size from 9 years ago.  There is no concern for UTI at this point his urinalysis stable.  I will ensure close follow-up with him in our urology clinic.  Our schedulers have been messaged and should contact him on Monday.  Cherlyn Labella, MD [facility] PGY4 [facility] Urologic Surgery

## 2021-03-09 DIAGNOSIS — N201 Calculus of ureter: Secondary | ICD-10-CM | POA: Diagnosis not present

## 2021-03-09 DIAGNOSIS — N281 Cyst of kidney, acquired: Secondary | ICD-10-CM | POA: Diagnosis not present

## 2021-05-17 DIAGNOSIS — Z20822 Contact with and (suspected) exposure to covid-19: Secondary | ICD-10-CM | POA: Diagnosis not present

## 2021-07-20 DIAGNOSIS — Z20822 Contact with and (suspected) exposure to covid-19: Secondary | ICD-10-CM | POA: Diagnosis not present

## 2021-07-20 DIAGNOSIS — U071 COVID-19: Secondary | ICD-10-CM | POA: Diagnosis not present

## 2021-07-23 ENCOUNTER — Ambulatory Visit: Payer: BC Managed Care – PPO | Admitting: Orthopaedic Surgery

## 2021-07-29 ENCOUNTER — Ambulatory Visit (INDEPENDENT_AMBULATORY_CARE_PROVIDER_SITE_OTHER): Payer: BC Managed Care – PPO | Admitting: Orthopaedic Surgery

## 2021-07-29 ENCOUNTER — Other Ambulatory Visit: Payer: Self-pay

## 2021-07-29 ENCOUNTER — Encounter: Payer: Self-pay | Admitting: Orthopaedic Surgery

## 2021-07-29 DIAGNOSIS — Z6841 Body Mass Index (BMI) 40.0 and over, adult: Secondary | ICD-10-CM | POA: Diagnosis not present

## 2021-07-29 DIAGNOSIS — M1711 Unilateral primary osteoarthritis, right knee: Secondary | ICD-10-CM | POA: Diagnosis not present

## 2021-07-29 MED ORDER — BUPIVACAINE HCL 0.25 % IJ SOLN
2.0000 mL | INTRAMUSCULAR | Status: AC | PRN
  Administered 2021-07-29: 11:00:00 2 mL via INTRA_ARTICULAR

## 2021-07-29 MED ORDER — METHYLPREDNISOLONE ACETATE 40 MG/ML IJ SUSP
40.0000 mg | INTRAMUSCULAR | Status: AC | PRN
  Administered 2021-07-29: 11:00:00 40 mg via INTRA_ARTICULAR

## 2021-07-29 MED ORDER — LIDOCAINE HCL 1 % IJ SOLN
2.0000 mL | INTRAMUSCULAR | Status: AC | PRN
  Administered 2021-07-29: 11:00:00 2 mL

## 2021-07-29 NOTE — Progress Notes (Signed)
Office Visit Note   Patient: Travis Powell           Date of Birth: 27-Mar-1971           MRN: [id number] Visit Date: 07/29/2021              Requested by: Belva Agee, MD [address] [place],  [place] [address] PCP: Belva Agee, MD   Assessment & Plan: Visit Diagnoses:  1. Unilateral primary osteoarthritis, right knee   2. Body mass index 40.0-44.9, adult (HCC)   3. Morbid obesity (HCC)     Plan: Impression is right knee osteoarthritis with underlying synovitis.  At this point, not concerned for infection.  We have discussed proceeding with cortisone injection today for which she would like to proceed.  He will follow-up with [place] as needed.  This is no longer being filed under [employer] so he does not need a work note today.  Follow-Up Instructions: Return if symptoms worsen or fail to improve.   Orders:  Orders Placed This Encounter  Procedures   Large Joint Inj: R knee    No orders of the defined types were placed in this encounter.     Procedures: Large Joint Inj: R knee on 07/29/2021 11:24 AM Indications: pain Details: 22 G needle, anterolateral approach Medications: 2 mL lidocaine 1 %; 2 mL bupivacaine 0.25 %; 40 mg methylPREDNISolone acetate 40 MG/ML     Clinical Data: No additional findings.   Subjective: Chief Complaint  Patient presents with   Right Knee - Pain    HPI patient is a pleasant 50 year old gentleman who comes in today with right knee pain and popping.  This began following an injury while at work on 11/10/2020.  He was getting out of a work car when a razor blade from door cut him on the lateral thigh.  He was seen in the ED where this had to be sutured.  A few days later, he had pain and swelling to the knee.  He was seen a few times for this where the knee was aspirated and infection ruled out.  He has been complaining of some popping, pain and stiffness since.  No mechanical symptoms.  Pain is worse with stairs  and hills.  He has been taking ibuprofen which minimally helps.  No previous cortisone injection.  Review of Systems as detailed in HPI.  All others reviewed and are negative.   Objective: Vital Signs: There were no vitals taken for this visit.  Physical Exam well-developed well-nourished gentleman in no acute distress.  Alert and oriented x3.  Ortho Exam right knee exam shows no effusion.  Range of motion 0 to 110 degrees.  No joint line tenderness.  Mild patellofemoral crepitus.  Ligaments are stable.  He is neurovascular intact distally.  Specialty Comments:  No specialty comments available.  Imaging: Outside imaging reviewed shows synovitis and osteoarthritis to the right knee.   PMFS History: Patient Active Problem List   Diagnosis Date Noted   Acute gout of knee 11/24/2020   Thigh laceration, right, subsequent encounter 11/24/2020   Morbid obesity (HCC) 02/06/2019   OSA (obstructive sleep apnea) 06/18/2014   Hyperlipidemia 06/18/2014   Abdominal lymphadenopathy 06/21/2012   DM2 (diabetes mellitus, type 2) (HCC) 06/21/2012   HTN (hypertension), benign 06/21/2012   Past Medical History:  Diagnosis Date   Abdominal lymphadenopathy 06/21/2012   Borderline abdominal/pelvic adenopathy seen incidentally on CT scan 2011.  No change follow up 10/13   Diabetes  mellitus    DM2 (diabetes mellitus, type 2) (HCC) 06/21/2012   HTN (hypertension), benign 06/21/2012   Hypertension    Left lumbar radiculopathy 06/21/2012   Leukocytosis 06/21/2012   High normal @ 10,000  41 poly, 46% lymphs stable over time 6// to 10/13 no anemia or thrombocytopenia   Migraine    Obesity    Sleep apnea    uses CPAP at night    Family History  Problem Relation Age of Onset   CAD Other    Stroke Other    Hypertension Mother    Diabetes Mother    Hypertension Father     Past Surgical History:  Procedure Laterality Date   CARPAL TUNNEL RELEASE     Social History   Occupational History    Not on file  Tobacco Use   Smoking status: Never   Smokeless tobacco: Never  Vaping Use   Vaping Use: Never used  Substance and Sexual Activity   Alcohol use: Yes    Comment: occ   Drug use: No   Sexual activity: Yes

## 2021-09-07 ENCOUNTER — Telehealth: Payer: Self-pay | Admitting: Orthopaedic Surgery

## 2021-09-07 NOTE — Telephone Encounter (Signed)
Records re-faxed to [facility], Jolee Ewing [phone], Initially faxed 08/21/21

## 2021-10-09 DIAGNOSIS — I10 Essential (primary) hypertension: Secondary | ICD-10-CM | POA: Diagnosis not present

## 2021-10-09 DIAGNOSIS — Z13220 Encounter for screening for lipoid disorders: Secondary | ICD-10-CM | POA: Diagnosis not present

## 2021-10-09 DIAGNOSIS — Z136 Encounter for screening for cardiovascular disorders: Secondary | ICD-10-CM | POA: Diagnosis not present

## 2021-10-09 DIAGNOSIS — Z6841 Body Mass Index (BMI) 40.0 and over, adult: Secondary | ICD-10-CM | POA: Insufficient documentation

## 2021-10-09 DIAGNOSIS — Z713 Dietary counseling and surveillance: Secondary | ICD-10-CM | POA: Diagnosis not present

## 2021-11-16 DIAGNOSIS — Y999 Unspecified external cause status: Secondary | ICD-10-CM | POA: Diagnosis not present

## 2021-11-16 DIAGNOSIS — S62320A Displaced fracture of shaft of second metacarpal bone, right hand, initial encounter for closed fracture: Secondary | ICD-10-CM | POA: Diagnosis not present

## 2021-11-16 DIAGNOSIS — S62300A Unspecified fracture of second metacarpal bone, right hand, initial encounter for closed fracture: Secondary | ICD-10-CM | POA: Diagnosis not present

## 2021-11-24 DIAGNOSIS — M79641 Pain in right hand: Secondary | ICD-10-CM | POA: Diagnosis not present

## 2021-11-24 DIAGNOSIS — T07XXXA Unspecified multiple injuries, initial encounter: Secondary | ICD-10-CM | POA: Diagnosis not present

## 2021-11-24 DIAGNOSIS — S62300A Unspecified fracture of second metacarpal bone, right hand, initial encounter for closed fracture: Secondary | ICD-10-CM | POA: Diagnosis not present

## 2021-11-25 DIAGNOSIS — Y999 Unspecified external cause status: Secondary | ICD-10-CM | POA: Diagnosis not present

## 2021-11-25 DIAGNOSIS — G8918 Other acute postprocedural pain: Secondary | ICD-10-CM | POA: Diagnosis not present

## 2021-11-25 DIAGNOSIS — X58XXXA Exposure to other specified factors, initial encounter: Secondary | ICD-10-CM | POA: Diagnosis not present

## 2021-11-25 DIAGNOSIS — S62330A Displaced fracture of neck of second metacarpal bone, right hand, initial encounter for closed fracture: Secondary | ICD-10-CM | POA: Diagnosis not present

## 2021-11-30 DIAGNOSIS — T07XXXA Unspecified multiple injuries, initial encounter: Secondary | ICD-10-CM | POA: Diagnosis not present

## 2021-11-30 DIAGNOSIS — R52 Pain, unspecified: Secondary | ICD-10-CM | POA: Diagnosis not present

## 2021-11-30 DIAGNOSIS — S62300A Unspecified fracture of second metacarpal bone, right hand, initial encounter for closed fracture: Secondary | ICD-10-CM | POA: Diagnosis not present

## 2021-12-22 DIAGNOSIS — S62300A Unspecified fracture of second metacarpal bone, right hand, initial encounter for closed fracture: Secondary | ICD-10-CM | POA: Diagnosis not present

## 2022-01-05 DIAGNOSIS — S62300D Unspecified fracture of second metacarpal bone, right hand, subsequent encounter for fracture with routine healing: Secondary | ICD-10-CM | POA: Diagnosis not present

## 2022-01-26 DIAGNOSIS — Z4789 Encounter for other orthopedic aftercare: Secondary | ICD-10-CM | POA: Diagnosis not present

## 2022-01-26 DIAGNOSIS — M79641 Pain in right hand: Secondary | ICD-10-CM | POA: Diagnosis not present

## 2022-01-26 DIAGNOSIS — S62300A Unspecified fracture of second metacarpal bone, right hand, initial encounter for closed fracture: Secondary | ICD-10-CM | POA: Diagnosis not present

## 2022-02-04 DIAGNOSIS — E785 Hyperlipidemia, unspecified: Secondary | ICD-10-CM | POA: Diagnosis not present

## 2022-02-04 DIAGNOSIS — I10 Essential (primary) hypertension: Secondary | ICD-10-CM | POA: Diagnosis not present

## 2022-02-04 DIAGNOSIS — R590 Localized enlarged lymph nodes: Secondary | ICD-10-CM | POA: Diagnosis not present

## 2022-02-04 DIAGNOSIS — G4733 Obstructive sleep apnea (adult) (pediatric): Secondary | ICD-10-CM | POA: Diagnosis not present

## 2022-02-08 ENCOUNTER — Other Ambulatory Visit (HOSPITAL_COMMUNITY): Payer: Self-pay | Admitting: General Surgery

## 2022-02-08 ENCOUNTER — Other Ambulatory Visit: Payer: Self-pay | Admitting: General Surgery

## 2022-02-26 ENCOUNTER — Encounter: Payer: Self-pay | Admitting: Nurse Practitioner

## 2022-02-26 ENCOUNTER — Encounter: Payer: BC Managed Care – PPO | Attending: General Surgery | Admitting: Dietician

## 2022-02-26 ENCOUNTER — Encounter: Payer: Self-pay | Admitting: Dietician

## 2022-02-26 DIAGNOSIS — E119 Type 2 diabetes mellitus without complications: Secondary | ICD-10-CM

## 2022-02-26 DIAGNOSIS — Z713 Dietary counseling and surveillance: Secondary | ICD-10-CM | POA: Diagnosis not present

## 2022-02-26 NOTE — Progress Notes (Signed)
Nutrition Assessment for Bariatric Surgery Medical Nutrition Therapy Appt Start Time: 11:00    End Time: 12:12  Patient was seen on 02/26/2022 for Pre-Operative Nutrition Assessment. Letter of approval faxed to [facility] bariatric surgery program coordinator on 02/26/2022.   Referral stated Supervised Weight Loss (SWL) visits needed: 0  Pt completed visits.   Pt has cleared nutrition requirements.   Planned surgery: Sleeve Gastrectomy  Pt expectation of surgery: To be about 215-225 Pt expectation of dietitian: trust what you say and follow recommends.    NUTRITION ASSESSMENT   Anthropometrics  Start weight at [facility]: 364.8 lbs (date: 02/26/2022)  Height: 71 in BMI: 50.88 kg/m2     Clinical  Medical hx: hypertension, obstructive sleep apnea on CPAP, diabetes mellitus type 2, right knee osteoarthritis and hyperlipidemia Medications: Amlodipine, metformin  Labs: chol 235; LDL 161; A1C 6.7 Notable signs/symptoms: none noted Any previous deficiencies? No  Micronutrient Nutrition Focused Physical Exam: Hair: No issues observed Eyes: No issues observed Mouth: No issues observed Neck: No issues observed Nails: No issues observed Skin: No issues observed  Lifestyle & Dietary Hx  Pt states he was diagnosed with HTN at age 60.  Pt states after getting out of school is when the weight started coming on. Pt states he injured his back in 2010, and was out of work for 4 years.  Patient lives with his wife and two adult children. Patient wife performs the food shopping and both the pt and wife prepare the meals. He reports that he typically skips or misses 5-6 out of 21 possible meals per week. He may have 5 meals per week that are take-out or at a restaurant.  Patient works as an Engineer, materials. He denies binge eating though has felt shame and/or guilt after eating too much food.  He denies having used laxatives or vomiting to facilitate weight loss. He denies emotional  eating during times of stress. He states that he knows the difference between hunger and thirst and can tell when he is full. Pt states he doesn't feel that he eats too much, he states he feels he eats too late. Pt states he eat more food out of boredom. Pt states he does not think he knows when he is satisfied, but states he does know when he is full. Pt states his wife had bariatric surgery and states she has done well with it.  24-Hr Dietary Recall First Meal: biscuit combo from [employer] Snack: chips or cookies Second Meal: skip or soda and pack of nabs or restaurant sit down lunch Snack: chips or cookies Third Meal: at a restaurant or spaghetti, or grilled steak, chicken, BBQ Snack: chips or cookies Beverages: soda (zero), sweet tea, water   Estimated Energy Needs Calories: 2000   NUTRITION DIAGNOSIS  Overweight/obesity ([place]-3.3) related to past poor dietary habits and physical inactivity as evidenced by patient w/ planned Sleeve Gastrectomy surgery following dietary guidelines for continued weight loss.    NUTRITION INTERVENTION  Nutrition counseling (C-1) and education (E-2) to facilitate bariatric surgery goals.  Educated pt on micronutrient deficiencies post surgery and strategies to mitigate that risk   Pre-Op Goals Reviewed with the Patient Track food and beverage intake (pen and paper, MyFitness Pal, Baritastic app, etc.) Make healthy food choices while monitoring portion sizes Consume 3 meals per day or try to eat every 3-5 hours Avoid concentrated sugars and fried foods Keep sugar & fat in the single digits per serving on food labels Practice CHEWING your food (  aim for applesauce consistency) Practice not drinking 15 minutes before, during, and 30 minutes after each meal and snack Avoid all carbonated beverages (ex: soda, sparkling beverages)  Limit caffeinated beverages (ex: coffee, tea, energy drinks) Avoid all sugar-sweetened beverages (ex: regular soda, sports  drinks)  Avoid alcohol  Aim for 64-100 ounces of FLUID daily (with at least half of fluid intake being plain water)  Aim for at least 60-80 grams of PROTEIN daily Look for a liquid protein source that contains ?15 g protein and ?5 g carbohydrate (ex: shakes, drinks, shots) Make a list of non-food related activities Physical activity is an important part of a healthy lifestyle so keep it moving! The goal is to reach 150 minutes of exercise per week, including cardiovascular and weight baring activity.  *Goals that are bolded indicate the pt would like to start working towards these  Handouts Provided Include  Bariatric Surgery handouts (Nutrition Visits, Pre-Op Goals, Protein Shakes, Vitamins & Minerals)  Learning Style & Readiness for Change Teaching method utilized: Visual & Auditory  Demonstrated degree of understanding via: Teach Back  Readiness Level: preparation Barriers to learning/adherence to lifestyle change: none identified  RD's Notes for Next Visit     MONITORING & EVALUATION Dietary intake, weekly physical activity, body weight, and pre-op goals reached at next nutrition visit.    Next Steps  Pt has completed visits. No further supervised visits required/recomended  Patient is to follow up at [facility] for Pre-Op Class >2 weeks before surgery for further nutrition education.

## 2022-03-02 ENCOUNTER — Ambulatory Visit (HOSPITAL_COMMUNITY)
Admission: RE | Admit: 2022-03-02 | Discharge: 2022-03-02 | Disposition: A | Discharge status: Home / Self Care | Payer: BC Managed Care – PPO | Source: Ambulatory Visit | Attending: General Surgery | Admitting: General Surgery

## 2022-03-02 DIAGNOSIS — Z01818 Encounter for other preprocedural examination: Secondary | ICD-10-CM | POA: Diagnosis not present

## 2022-03-02 DIAGNOSIS — K449 Diaphragmatic hernia without obstruction or gangrene: Secondary | ICD-10-CM | POA: Diagnosis not present

## 2022-03-06 DIAGNOSIS — F5089 Other specified eating disorder: Secondary | ICD-10-CM | POA: Diagnosis not present

## 2022-03-11 ENCOUNTER — Ambulatory Visit (INDEPENDENT_AMBULATORY_CARE_PROVIDER_SITE_OTHER): Payer: BC Managed Care – PPO | Admitting: Student

## 2022-03-11 ENCOUNTER — Encounter: Payer: Self-pay | Admitting: Student

## 2022-03-11 VITALS — BP 176/78 | HR 82 | Wt 367.1 lb

## 2022-03-11 DIAGNOSIS — Z1211 Encounter for screening for malignant neoplasm of colon: Secondary | ICD-10-CM | POA: Diagnosis not present

## 2022-03-11 DIAGNOSIS — E782 Mixed hyperlipidemia: Secondary | ICD-10-CM | POA: Diagnosis not present

## 2022-03-11 DIAGNOSIS — E119 Type 2 diabetes mellitus without complications: Secondary | ICD-10-CM

## 2022-03-11 DIAGNOSIS — Z6841 Body Mass Index (BMI) 40.0 and over, adult: Secondary | ICD-10-CM

## 2022-03-11 DIAGNOSIS — I10 Essential (primary) hypertension: Secondary | ICD-10-CM | POA: Diagnosis not present

## 2022-03-11 DIAGNOSIS — Z0000 Encounter for general adult medical examination without abnormal findings: Secondary | ICD-10-CM | POA: Insufficient documentation

## 2022-03-11 DIAGNOSIS — Z7984 Long term (current) use of oral hypoglycemic drugs: Secondary | ICD-10-CM

## 2022-03-11 LAB — POCT GLYCOSYLATED HEMOGLOBIN (HGB A1C): Hemoglobin A1C: 7.4 % — AB (ref 4.0–5.6)

## 2022-03-11 LAB — GLUCOSE, CAPILLARY: Glucose-Capillary: 133 mg/dL — ABNORMAL HIGH (ref 70–99)

## 2022-03-11 MED ORDER — LOSARTAN POTASSIUM 100 MG PO TABS: 100.0000 mg | ORAL_TABLET | Freq: Every day | ORAL | 1 refills | Status: DC

## 2022-03-11 MED ORDER — METFORMIN HCL 500 MG PO TABS: 500.0000 mg | ORAL_TABLET | Freq: Two times a day (BID) | ORAL | 2 refills | Status: DC

## 2022-03-11 MED ORDER — ATORVASTATIN CALCIUM 20 MG PO TABS: 20.0000 mg | ORAL_TABLET | Freq: Every day | ORAL | 3 refills | Status: DC

## 2022-03-11 MED ORDER — AMLODIPINE BESYLATE 10 MG PO TABS: 10.0000 mg | ORAL_TABLET | ORAL | 1 refills | Status: DC

## 2022-03-11 NOTE — Assessment & Plan Note (Addendum)
Patient presents to the clinic with elevated blood pressure in the 170s.  Patient reports that he has been out of his amlodipine.  He has also been out of his losartan.  He is currently managed with amlodipine 10 mg daily and losartan 100 mg daily.  He states that he has been taking his wife's amlodipine 10 mg daily.  He does check his blood pressure at home and states that it is in the 150s to 170s.  I will refill his medications today.  He denies any chest pains, shortness of breath, vision changes, or headaches.  Plan: - I will obtain CMP today to evaluate kidney function - For the time being continue losartan 100 mg daily and amlodipine 10 mg daily - Have patient follow-up in 1 month for blood pressure check   Addendum: - Creatinine back to baseline

## 2022-03-11 NOTE — Assessment & Plan Note (Addendum)
Patient's most recent lipid panel on 02/24 showing total cholesterol 235 and LDL of 161 and total triglycerides of 138.  HDL 46.  Patient reports that he has been out of his atorvastatin.  He is supposed to be on atorvastatin 20 mg daily.  Given that he has been off his atorvastatin, I am not inclined to change his atorvastatin today.  Repeat lipid panel today.  Given that he has been off his atorvastatin, I am not inclined to change his atorvastatin today.  Repeat lipid panel today.  Recalculate his ASCVD score and then see if we need to make changes.  Plan: - Obtain lipid panel - For now continue atorvastatin 20 mg daily  Addendum: -The 10-year ASCVD risk score (Arnett DK, et al., 2019) is: 33.4%   Values used to calculate the score:     Age: 28 years     Sex: Male     Is Non-Hispanic African American: Yes     Diabetic: Yes     Tobacco smoker: No     Systolic Blood Pressure: 176 mmHg     Is BP treated: Yes     HDL Cholesterol: 43 mg/dL     Total Cholesterol: 271 mg/dL -Plan to increase atorvastatin to 40 mg daily if he is able to tolerate his 20 mg statin.

## 2022-03-11 NOTE — Patient Instructions (Addendum)
Travis Powell, Travis Powell you for allowing me to take part in your care today.  Here are your instructions.  1.Regarding your high blood pressure, I want you to start taking your amlodipine and losartan as I have refilled it.  I want you to maintain a log.  Try to take your blood pressure about 3 times a week when you are not stressed.  2.  Regarding your high cholesterol, I want you to restart your atorvastatin 20 mg daily.  I will check your cholesterol panel today.  I will call you with the results.  3.  Regarding your diabetes I want you to re- uptitrate your metformin.  I gave you a handout on how to do this.  Your A1c today was 7.4.  I want you to come back in 3 months to recheck your A1c.  4.  Follow-up in 1 month for blood pressure checkup.  Thank you, Dr. Allena Katz  If you have any other questions please contact the internal medicine clinic at [phone]

## 2022-03-11 NOTE — Assessment & Plan Note (Signed)
Patient discusses that he is going to go to bariatric surgery.  He reports that his surgeon requested him get a colonoscopy or a colon cancer screening.  Patient is counseled on diet and exercise changes.  Patient's current BMI is 51.2.  Plan: - Patient counseled on lifestyle modifications - Continue to follow-up with bariatric surgeon - Cologuard ordered

## 2022-03-11 NOTE — Assessment & Plan Note (Addendum)
Patient is due for colon cancer screening.  His surgeon for his bariatric surgery also requested him to get screening for this.  Patient is agreeable to Cologuard.  Plan: - Order Cologuard

## 2022-03-11 NOTE — Progress Notes (Addendum)
CC: Follow-up for diabetes and hyperlipidemia  HPI:  Travis Powell is a 51 y.o. male with a past medical history of hypertension and hyperlipidemia who presents to the clinic for follow-up.  He reports he would like his medications refilled.  Please see assessment and plan for full HPI.  Past Medical History:  Diagnosis Date   Abdominal lymphadenopathy 06/21/2012   Borderline abdominal/pelvic adenopathy seen incidentally on CT scan 2011.  No change follow up 10/13   Diabetes mellitus    DM2 (diabetes mellitus, type 2) (HCC) 06/21/2012   HTN (hypertension), benign 06/21/2012   Hypertension    Left lumbar radiculopathy 06/21/2012   Leukocytosis 06/21/2012   High normal @ 10,000  41 poly, 46% lymphs stable over time 6// to 10/13 no anemia or thrombocytopenia   Migraine    Obesity    Sleep apnea    uses CPAP at night     Current Outpatient Medications:    amLODipine (NORVASC) 10 MG tablet, Take 1 tablet (10 mg total) by mouth every morning., Disp: 90 tablet, Rfl: 1   atorvastatin (LIPITOR) 20 MG tablet, Take 1 tablet (20 mg total) by mouth at bedtime. Reported on 12/04/2015, Disp: 90 tablet, Rfl: 3   losartan (COZAAR) 100 MG tablet, Take 1 tablet (100 mg total) by mouth daily., Disp: 90 tablet, Rfl: 1   metFORMIN (GLUCOPHAGE) 500 MG tablet, Take 1 tablet (500 mg total) by mouth 2 (two) times daily with a meal., Disp: 60 tablet, Rfl: 2  Review of Systems:    Constitutional: Patient denies any headache Eye: Patient denies any vision changes Respiratory: Patient denies any shortness of breath Cardiovascular: Patient denies any chest pain  Physical Exam:  Vitals:   03/11/22 0852  BP: (!) 176/78  Pulse: 82  SpO2: 100%  Weight: (!) 367 lb 1.6 oz (166.5 kg)    General: Alert and orientated x3. Patient is sitting comfortably in the room  Eyes:EOM intact  Head: Normocephalic, atraumatic  Cardio: Regular rate and rhythm, no murmurs, rubs or gallops. 2+ pulses to bilateral  upper and lower extremities  Pulmonary: Clear to ausculation bilaterally with no rales, rhonchi, and crackles  Extremities: Bilateral lower extremities with great sensation.  2+ pedal pulses noted.  Patient is able to distinguish between sharp and dull stimuli.     Assessment & Plan:   Screening for colon cancer Patient is due for colon cancer screening.  His surgeon for his bariatric surgery also requested him to get screening for this.  Patient is agreeable to Cologuard.  Plan: - Order Cologuard  Morbid obesity [facility]) Patient discusses that he is going to go to bariatric surgery.  He reports that his surgeon requested him get a colonoscopy or a colon cancer screening.  Patient is counseled on diet and exercise changes.  Patient's current BMI is 51.2.  Plan: - Patient counseled on lifestyle modifications - Continue to follow-up with bariatric surgeon - Cologuard ordered  Hyperlipidemia Patient's most recent lipid panel on 02/24 showing total cholesterol 235 and LDL of 161 and total triglycerides of 138.  HDL 46.  Patient reports that he has been out of his atorvastatin.  He is supposed to be on atorvastatin 20 mg daily.  Given that he has been off his atorvastatin, I am not inclined to change his atorvastatin today.  Repeat lipid panel today.  Given that he has been off his atorvastatin, I am not inclined to change his atorvastatin today.  Repeat lipid panel today.  Recalculate  his ASCVD score and then see if we need to make changes.  Plan: - Obtain lipid panel - For now continue atorvastatin 20 mg daily  Addendum: -The 10-year ASCVD risk score (Arnett DK, et al., 2019) is: 33.4%   Values used to calculate the score:     Age: 58 years     Sex: Male     Is Non-Hispanic African American: Yes     Diabetic: Yes     Tobacco smoker: No     Systolic Blood Pressure: 176 mmHg     Is BP treated: Yes     HDL Cholesterol: 43 mg/dL     Total Cholesterol: 271 mg/dL -Plan to increase  atorvastatin to 40 mg daily if he is able to tolerate his 20 mg statin.  HTN (hypertension), benign Patient presents to the clinic with elevated blood pressure in the 170s.  Patient reports that he has been out of his amlodipine.  He has also been out of his losartan.  He is currently managed with amlodipine 10 mg daily and losartan 100 mg daily.  He states that he has been taking his wife's amlodipine 10 mg daily.  He does check his blood pressure at home and states that it is in the 150s to 170s.  I will refill his medications today.  He denies any chest pains, shortness of breath, vision changes, or headaches.  Plan: - I will obtain CMP today to evaluate kidney function - For the time being continue losartan 100 mg daily and amlodipine 10 mg daily - Have patient follow-up in 1 month for blood pressure check   Addendum: - Creatinine back to baseline  DM2 (diabetes mellitus, type 2) Patient's most recent hemoglobin A1c was on 11/24/2020 and it was 6.7.  Patient current regimen includes metformin 500 mg twice daily.  He states that he ran out of this.  He is here for a refill request.  He denies any polydipsia, polyuria, or neuropathic pains at this time.  On exam, patient has good sensation to his bilateral lower extremities.  Patient reports that he would like a refill on his metformin which I will provide him.  Given that he has been off of it, I prefer him to titrate up to 1000 mg twice daily.  Plan: - Patient's A1c today 7.4 - Plan will be to titrate metformin up to 1000 mg twice daily. - Follow-up in 3 months for repeat A1c - Foot exam updated - Ophthalmology referral placed for dilated eye exam  Addendum: - Patient's A1c is 7.4 today.  This could be because he has been out of his metformin.  Continue doing metformin 1000 mg twice daily.    Patient seen with Dr.  Huntley Estelle, DO PGY-1 Internal Medicine Resident  Pager: [phone]

## 2022-03-11 NOTE — Assessment & Plan Note (Addendum)
Patient's most recent hemoglobin A1c was on 11/24/2020 and it was 6.7.  Patient current regimen includes metformin 500 mg twice daily.  He states that he ran out of this.  He is here for a refill request.  He denies any polydipsia, polyuria, or neuropathic pains at this time.  On exam, patient has good sensation to his bilateral lower extremities.  Patient reports that he would like a refill on his metformin which I will provide him.  Given that he has been off of it, I prefer him to titrate up to 1000 mg twice daily.  Plan: - Patient's A1c today 7.4 - Plan will be to titrate metformin up to 1000 mg twice daily. - Follow-up in 3 months for repeat A1c - Foot exam updated - Ophthalmology referral placed for dilated eye exam  Addendum: - Patient's A1c is 7.4 today.  This could be because he has been out of his metformin.  Continue doing metformin 1000 mg twice daily.

## 2022-03-12 ENCOUNTER — Encounter: Payer: Self-pay | Admitting: Student

## 2022-03-12 LAB — LIPID PANEL
Chol/HDL Ratio: 6.3 ratio — ABNORMAL HIGH (ref 0.0–5.0)
Cholesterol, Total: 271 mg/dL — ABNORMAL HIGH (ref 100–199)
HDL: 43 mg/dL (ref >39)
LDL Chol Calc (NIH): 209 mg/dL — ABNORMAL HIGH (ref 0–99)
Triglycerides: 105 mg/dL (ref 0–149)
VLDL Cholesterol Cal: 19 mg/dL (ref 5–40)

## 2022-03-12 LAB — CMP14 + ANION GAP
ALT: 15 IU/L (ref 0–44)
AST: 19 IU/L (ref 0–40)
Albumin/Globulin Ratio: 1.4 (ref 1.2–2.2)
Albumin: 4.4 g/dL (ref 3.8–4.9)
Alkaline Phosphatase: 71 IU/L (ref 44–121)
Anion Gap: 19 mmol/L — ABNORMAL HIGH (ref 10.0–18.0)
BUN/Creatinine Ratio: 15 (ref 9–20)
BUN: 16 mg/dL (ref 6–24)
Bilirubin Total: 0.4 mg/dL (ref 0.0–1.2)
CO2: 22 mmol/L (ref 20–29)
Calcium: 9.2 mg/dL (ref 8.7–10.2)
Chloride: 99 mmol/L (ref 96–106)
Creatinine, Ser: 1.09 mg/dL (ref 0.76–1.27)
Globulin, Total: 3.2 g/dL (ref 1.5–4.5)
Glucose: 141 mg/dL — ABNORMAL HIGH (ref 70–99)
Potassium: 3.6 mmol/L (ref 3.5–5.2)
Sodium: 140 mmol/L (ref 134–144)
Total Protein: 7.6 g/dL (ref 6.0–8.5)
eGFR: 82 mL/min/{1.73_m2} (ref >59)

## 2022-03-12 NOTE — Progress Notes (Signed)
Internal Medicine Clinic Attending  I saw and evaluated the patient.  I personally confirmed the key portions of the history and exam documented by Dr. Allena Katz and I reviewed pertinent patient test results.  The assessment, diagnosis, and plan were formulated together and I agree with the documentation in the resident's note.    Patient hasn't been to the clinic in a while, here for refills and to get back on good plans for his HTN, T2DM, and HLD.  For T2DM, will slowly uptitrate Metformin up to 1000mg  BID. He would be a good GLP1 candidate in the future.  BP poorly controlled today off of Losartan. Will resume his Amlodipine & Losartan, and repeat BP at next visit. He may need more meds.  ASCVD risk >20% and LDL >200, so he should be on high-intensity statin. We had restarted Atorva 20mg  daily before labs came back - if he's tolerating this well, would increase dose at next outpatient visit.

## 2022-03-19 ENCOUNTER — Encounter: Payer: Self-pay | Admitting: *Deleted

## 2022-03-22 DIAGNOSIS — Z1211 Encounter for screening for malignant neoplasm of colon: Secondary | ICD-10-CM | POA: Diagnosis not present

## 2022-03-29 ENCOUNTER — Ambulatory Visit: Payer: BC Managed Care – PPO | Admitting: Nurse Practitioner

## 2022-03-30 ENCOUNTER — Ambulatory Visit: Payer: Self-pay | Admitting: Licensed Clinical Social Worker

## 2022-03-30 NOTE — Patient Outreach (Signed)
  Care Coordination   03/30/2022 Name: Travis Powell MRN: [id number] DOB: [birth year]   Care Coordination Outreach Attempts:  An unsuccessful telephone outreach was attempted today to offer the patient information about available care coordination services as a benefit of their health plan.   Follow Up Plan:  Additional outreach attempts will be made to offer the patient care coordination information and services.   Encounter Outcome:  No Answer  Care Coordination Interventions Activated:  No   Care Coordination Interventions:  No, not indicated    Ander Gaster, MSW  Social Worker [facility]  [phone]

## 2022-03-31 LAB — COLOGUARD: COLOGUARD: NEGATIVE

## 2022-04-01 ENCOUNTER — Other Ambulatory Visit: Payer: Self-pay

## 2022-04-01 ENCOUNTER — Encounter: Payer: Self-pay | Admitting: Internal Medicine

## 2022-04-01 ENCOUNTER — Ambulatory Visit (INDEPENDENT_AMBULATORY_CARE_PROVIDER_SITE_OTHER): Payer: BC Managed Care – PPO | Admitting: Internal Medicine

## 2022-04-01 VITALS — BP 163/95 | HR 82 | Temp 98.2°F | Ht 71.0 in | Wt 366.7 lb

## 2022-04-01 DIAGNOSIS — I10 Essential (primary) hypertension: Secondary | ICD-10-CM

## 2022-04-01 DIAGNOSIS — G4733 Obstructive sleep apnea (adult) (pediatric): Secondary | ICD-10-CM

## 2022-04-01 DIAGNOSIS — E782 Mixed hyperlipidemia: Secondary | ICD-10-CM

## 2022-04-01 DIAGNOSIS — Z6841 Body Mass Index (BMI) 40.0 and over, adult: Secondary | ICD-10-CM

## 2022-04-01 MED ORDER — SPIRONOLACTONE 25 MG PO TABS: 25.0000 mg | ORAL_TABLET | Freq: Every day | ORAL | 11 refills | Status: DC

## 2022-04-01 MED ORDER — ATORVASTATIN CALCIUM 40 MG PO TABS: 40.0000 mg | ORAL_TABLET | Freq: Every day | ORAL | 11 refills | Status: DC

## 2022-04-01 NOTE — Progress Notes (Signed)
   CC: need for sleep study  HPI:  [name] is a 51 y.o. person with past medical history as detailed below who presents today needing a referral for a sleep study given history of OSA currently on CPAP and upcoming bariatric procedure. Please see problem based charting for detailed assessment and plan.  Past Medical History:  Diagnosis Date   Abdominal lymphadenopathy 06/21/2012   Borderline abdominal/pelvic adenopathy seen incidentally on CT scan 2011.  No change follow up 10/13   Diabetes mellitus    DM2 (diabetes mellitus, type 2) (HCC) 06/21/2012   HTN (hypertension), benign 06/21/2012   Hypertension    Left lumbar radiculopathy 06/21/2012   Leukocytosis 06/21/2012   High normal @ 10,000  41 poly, 46% lymphs stable over time 6// to 10/13 no anemia or thrombocytopenia   Migraine    Obesity    Sleep apnea    uses CPAP at night   Review of Systems:  Negative unless otherwise stated.  Physical Exam:  Vitals:   04/01/22 1342  BP: (!) 163/95  Pulse: 82  Temp: 98.2 F (36.8 C)  TempSrc: Oral  SpO2: 93%  Weight: (!) 366 lb 11.2 oz (166.3 kg)  Height: 5\' 11"  (1.803 m)   Constitutional:Well appearing, obese gentleman in no acute distress. Cardio:Regular rate and rhythm. No murmurs, rubs, or gallops. Pulm:Clear to auscultation bilaterally.  for extremity edema. Skin:Warm and dry. Neuro:Alert and oriented x3. No focal deficit noted. Psych:Pleasant mood and affect.  Assessment & Plan:   See Encounters Tab for problem based charting.  HTN (hypertension), benign BP remains uncontrolled today at 163/95. Current regimen is losartan 100 mg daily and amlodipine 10 mg daily. 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 On further chart review it does appear that he had borderline abnormal renin/aldosterone studies back in 2015. Plan: Will obtain renin/aldosterone labs  today and start spironolactone 25 mg daily. K has been borderline low over some previous metabolic panels so will avoid loop or thiazide diuretics. Continue losartan 100 mg daily and amlodipine 10 mg daily. Sleep study referral placed to assess for OSA as possible secondary cause of uncontrolled HTN.  OSA (obstructive sleep apnea) Patient reports history of OSA currently managed with CPAP machine. It has been over 5 years since his last sleep study. He has been asked by the surgeon for his upcoming bariatric surgery to get the report from his CPAP machine.  Plan:Referral for sleep study placed.  Hyperlipidemia HLD: ASCVD risk 33.4% per last OV. Current regimen of atorvastatin 20 mg daily which he is tolerating well. Plan:Increase atorvastatin to 40 mg daily. Recheck lipid profile in 3 months.  Morbid obesity (HCC) Patient is planning for bariatric surgery once he is able to obtain data from his CPAP machine for his surgeon. This will absolutely help with his weight, but I also recommend adding an SGLT2 at a future visit to help address his weight and glycemic control.  Patient discussed with Dr.  2016

## 2022-04-01 NOTE — Assessment & Plan Note (Signed)
Patient reports history of OSA currently managed with CPAP machine. It has been over 5 years since his last sleep study. He has been asked by the surgeon for his upcoming bariatric surgery to get the report from his CPAP machine.  Plan:Referral for sleep study placed.

## 2022-04-01 NOTE — Assessment & Plan Note (Addendum)
BP remains uncontrolled today at 163/95. Current regimen is losartan 100 mg daily and amlodipine 10 mg daily. 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 On further chart review it does appear that he had borderline abnormal renin/aldosterone studies back in 2015. Plan: Will obtain renin/aldosterone labs today and start spironolactone 25 mg daily. K has been borderline low over some previous metabolic panels so will avoid loop or thiazide diuretics. Continue losartan 100 mg daily and amlodipine 10 mg daily. Sleep study referral placed to assess for OSA as possible secondary cause of uncontrolled HTN.

## 2022-04-01 NOTE — Progress Notes (Signed)
Internal Medicine Clinic Attending  Case discussed with Dr. August Saucer  At the time of the visit.  We reviewed the resident's history and exam and pertinent patient test results.  I agree with the assessment, diagnosis, and plan of care documented in the resident's note.    For his T2DM, if A1C not improved on Metformin, could also consider GLP-1 for weight loss benefits

## 2022-04-01 NOTE — Patient Instructions (Addendum)
Mr. Difatta,  It was nice seeing you today! Thank you for choosing [facility] for your Primary Care.    Today we talked about:   High cholesterol: I am glad to hear that you are doing well on atorvastatin. I am going to send a prescription for an increased dose. You will now take 40 mg each day. You can take two of your 20 mg pills until they run out and then pick up the new prescription and take just one. High blood pressure: Your blood pressure is still quite high. I would like you to continue losartan and amlodipine. I am also starting a medicine called spironolactone to further work to improve your blood pressure. I am also checking a few labs today to see if there is another reason you have had such high blood pressure your whole life. Sleep apnea: I have placed a referral for a new sleep study to help with your CPAP needs.  Good luck with your upcoming surgery! We will see you back in about 2 months to check on your diabetes, blood pressure, cholesterol.  My best, Dr. August Saucer

## 2022-04-01 NOTE — Assessment & Plan Note (Signed)
Patient is planning for bariatric surgery once he is able to obtain data from his CPAP machine for his surgeon. This will absolutely help with his weight, but I also recommend adding an SGLT2 at a future visit to help address his weight and glycemic control.

## 2022-04-01 NOTE — Assessment & Plan Note (Signed)
HLD: ASCVD risk 33.4% per last OV. Current regimen of atorvastatin 20 mg daily which he is tolerating well. Plan:Increase atorvastatin to 40 mg daily. Recheck lipid profile in 3 months.

## 2022-04-03 ENCOUNTER — Other Ambulatory Visit: Payer: Self-pay | Admitting: Student

## 2022-04-03 DIAGNOSIS — E119 Type 2 diabetes mellitus without complications: Secondary | ICD-10-CM

## 2022-04-05 LAB — ALDOSTERONE + RENIN ACTIVITY W/ RATIO
ALDOS/RENIN RATIO: 15.8 (ref 0.0–30.0)
ALDOSTERONE: 5.4 ng/dL (ref 0.0–30.0)
Renin: 0.341 ng/mL/hr (ref 0.167–5.380)

## 2022-04-15 ENCOUNTER — Telehealth: Payer: Self-pay | Admitting: Internal Medicine

## 2022-04-15 ENCOUNTER — Encounter (HOSPITAL_BASED_OUTPATIENT_CLINIC_OR_DEPARTMENT_OTHER): Payer: Self-pay

## 2022-04-15 ENCOUNTER — Encounter (HOSPITAL_COMMUNITY): Payer: Self-pay

## 2022-04-15 ENCOUNTER — Observation Stay (HOSPITAL_BASED_OUTPATIENT_CLINIC_OR_DEPARTMENT_OTHER)
Admission: EM | Admit: 2022-04-15 | Discharge: 2022-04-16 | Disposition: A | Discharge status: Home / Self Care | Payer: BC Managed Care – PPO | Attending: Internal Medicine | Admitting: Internal Medicine

## 2022-04-15 ENCOUNTER — Other Ambulatory Visit: Payer: Self-pay

## 2022-04-15 ENCOUNTER — Emergency Department (HOSPITAL_BASED_OUTPATIENT_CLINIC_OR_DEPARTMENT_OTHER): Payer: BC Managed Care – PPO

## 2022-04-15 DIAGNOSIS — R531 Weakness: Secondary | ICD-10-CM | POA: Diagnosis not present

## 2022-04-15 DIAGNOSIS — F17290 Nicotine dependence, other tobacco product, uncomplicated: Secondary | ICD-10-CM | POA: Insufficient documentation

## 2022-04-15 DIAGNOSIS — I10 Essential (primary) hypertension: Secondary | ICD-10-CM | POA: Insufficient documentation

## 2022-04-15 DIAGNOSIS — R3915 Urgency of urination: Secondary | ICD-10-CM | POA: Insufficient documentation

## 2022-04-15 DIAGNOSIS — E1165 Type 2 diabetes mellitus with hyperglycemia: Secondary | ICD-10-CM | POA: Diagnosis not present

## 2022-04-15 DIAGNOSIS — Z79899 Other long term (current) drug therapy: Secondary | ICD-10-CM | POA: Insufficient documentation

## 2022-04-15 DIAGNOSIS — Z20822 Contact with and (suspected) exposure to covid-19: Secondary | ICD-10-CM | POA: Diagnosis not present

## 2022-04-15 DIAGNOSIS — E559 Vitamin D deficiency, unspecified: Secondary | ICD-10-CM | POA: Insufficient documentation

## 2022-04-15 DIAGNOSIS — G459 Transient cerebral ischemic attack, unspecified: Secondary | ICD-10-CM | POA: Diagnosis not present

## 2022-04-15 DIAGNOSIS — I6381 Other cerebral infarction due to occlusion or stenosis of small artery: Secondary | ICD-10-CM | POA: Diagnosis not present

## 2022-04-15 DIAGNOSIS — G4733 Obstructive sleep apnea (adult) (pediatric): Secondary | ICD-10-CM

## 2022-04-15 DIAGNOSIS — Z7984 Long term (current) use of oral hypoglycemic drugs: Secondary | ICD-10-CM | POA: Insufficient documentation

## 2022-04-15 DIAGNOSIS — I639 Cerebral infarction, unspecified: Secondary | ICD-10-CM | POA: Diagnosis present

## 2022-04-15 DIAGNOSIS — M62562 Muscle wasting and atrophy, not elsewhere classified, left lower leg: Secondary | ICD-10-CM | POA: Diagnosis not present

## 2022-04-15 DIAGNOSIS — E785 Hyperlipidemia, unspecified: Secondary | ICD-10-CM | POA: Diagnosis present

## 2022-04-15 LAB — COMPREHENSIVE METABOLIC PANEL
ALT: 17 U/L (ref 0–44)
AST: 19 U/L (ref 15–41)
Albumin: 4.2 g/dL (ref 3.5–5.0)
Alkaline Phosphatase: 65 U/L (ref 38–126)
Anion gap: 10 (ref 5–15)
BUN: 10 mg/dL (ref 6–20)
CO2: 26 mmol/L (ref 22–32)
Calcium: 9.1 mg/dL (ref 8.9–10.3)
Chloride: 101 mmol/L (ref 98–111)
Creatinine, Ser: 0.97 mg/dL (ref 0.61–1.24)
GFR, Estimated: >60 mL/min (ref >60)
Glucose, Bld: 144 mg/dL — ABNORMAL HIGH (ref 70–99)
Potassium: 3.4 mmol/L — ABNORMAL LOW (ref 3.5–5.1)
Sodium: 137 mmol/L (ref 135–145)
Total Bilirubin: 0.6 mg/dL (ref 0.3–1.2)
Total Protein: 7.6 g/dL (ref 6.5–8.1)

## 2022-04-15 LAB — RAPID URINE DRUG SCREEN, HOSP PERFORMED
Amphetamines: NOT DETECTED
Barbiturates: NOT DETECTED
Benzodiazepines: NOT DETECTED
Cocaine: NOT DETECTED
Opiates: NOT DETECTED
Tetrahydrocannabinol: NOT DETECTED

## 2022-04-15 LAB — URINALYSIS, ROUTINE W REFLEX MICROSCOPIC
Bilirubin Urine: NEGATIVE
Glucose, UA: NEGATIVE mg/dL
Hgb urine dipstick: NEGATIVE
Ketones, ur: NEGATIVE mg/dL
Leukocytes,Ua: NEGATIVE
Nitrite: NEGATIVE
Protein, ur: NEGATIVE mg/dL
Specific Gravity, Urine: 1.01 (ref 1.005–1.030)
pH: 7.5 (ref 5.0–8.0)

## 2022-04-15 LAB — DIFFERENTIAL
Abs Immature Granulocytes: 0.04 10*3/uL (ref 0.00–0.07)
Basophils Absolute: 0 10*3/uL (ref 0.0–0.1)
Basophils Relative: 0 %
Eosinophils Absolute: 0.3 10*3/uL (ref 0.0–0.5)
Eosinophils Relative: 3 %
Immature Granulocytes: 0 %
Lymphocytes Relative: 42 %
Lymphs Abs: 3.9 10*3/uL (ref 0.7–4.0)
Monocytes Absolute: 0.6 10*3/uL (ref 0.1–1.0)
Monocytes Relative: 6 %
Neutro Abs: 4.5 10*3/uL (ref 1.7–7.7)
Neutrophils Relative %: 49 %

## 2022-04-15 LAB — RESP PANEL BY RT-PCR (FLU A&B, COVID) ARPGX2
Influenza A by PCR: NEGATIVE
Influenza B by PCR: NEGATIVE
SARS Coronavirus 2 by RT PCR: NEGATIVE

## 2022-04-15 LAB — CBC
HCT: 38.5 % — ABNORMAL LOW (ref 39.0–52.0)
Hemoglobin: 12.8 g/dL — ABNORMAL LOW (ref 13.0–17.0)
MCH: 27.9 pg (ref 26.0–34.0)
MCHC: 33.2 g/dL (ref 30.0–36.0)
MCV: 83.9 fL (ref 80.0–100.0)
Platelets: 269 10*3/uL (ref 150–400)
RBC: 4.59 MIL/uL (ref 4.22–5.81)
RDW: 14.2 % (ref 11.5–15.5)
WBC: 9.4 10*3/uL (ref 4.0–10.5)
nRBC: 0 % (ref 0.0–0.2)

## 2022-04-15 LAB — ETHANOL: Alcohol, Ethyl (B): <10 mg/dL (ref <10)

## 2022-04-15 LAB — PROTIME-INR
INR: 1.1 (ref 0.8–1.2)
Prothrombin Time: 13.8 seconds (ref 11.4–15.2)

## 2022-04-15 LAB — APTT: aPTT: 30 seconds (ref 24–36)

## 2022-04-15 LAB — CBG MONITORING, ED: Glucose-Capillary: 105 mg/dL — ABNORMAL HIGH (ref 70–99)

## 2022-04-15 LAB — GLUCOSE, CAPILLARY: Glucose-Capillary: 190 mg/dL — ABNORMAL HIGH (ref 70–99)

## 2022-04-15 MED ORDER — ACETAMINOPHEN 325 MG PO TABS: 650.0000 mg | ORAL_TABLET | ORAL | Status: DC | PRN

## 2022-04-15 MED ORDER — ACETAMINOPHEN 650 MG RE SUPP: 650.0000 mg | RECTAL | Status: DC | PRN

## 2022-04-15 MED ORDER — ASPIRIN 325 MG PO TABS
325.0000 mg | ORAL_TABLET | Freq: Every day | ORAL | Status: DC
  Administered 2022-04-15: 325 mg via ORAL
  Filled 2022-04-15: qty 1

## 2022-04-15 MED ORDER — CLOPIDOGREL BISULFATE 75 MG PO TABS
75.0000 mg | ORAL_TABLET | Freq: Every day | ORAL | Status: DC
Start: 2022-04-16 — End: 2022-04-16
  Administered 2022-04-16: 75 mg via ORAL
  Filled 2022-04-15: qty 1

## 2022-04-15 MED ORDER — ASPIRIN 81 MG PO TBEC
81.0000 mg | DELAYED_RELEASE_TABLET | Freq: Every day | ORAL | Status: DC
  Administered 2022-04-16: 81 mg via ORAL
  Filled 2022-04-15: qty 1

## 2022-04-15 MED ORDER — INSULIN ASPART 100 UNIT/ML IJ SOLN
0.0000 [IU] | Freq: Three times a day (TID) | INTRAMUSCULAR | Status: DC
  Administered 2022-04-16 (×2): 3 [IU] via SUBCUTANEOUS

## 2022-04-15 MED ORDER — INSULIN ASPART 100 UNIT/ML IJ SOLN: 0.0000 [IU] | Freq: Every day | INTRAMUSCULAR | Status: DC

## 2022-04-15 MED ORDER — STROKE: EARLY STAGES OF RECOVERY BOOK
Freq: Once | Status: AC
Start: 2022-04-16 — End: 2022-04-16
  Filled 2022-04-15: qty 1

## 2022-04-15 MED ORDER — CLOPIDOGREL BISULFATE 300 MG PO TABS
300.0000 mg | ORAL_TABLET | Freq: Once | ORAL | Status: AC
  Administered 2022-04-15: 300 mg via ORAL
  Filled 2022-04-15: qty 1

## 2022-04-15 MED ORDER — ACETAMINOPHEN 160 MG/5ML PO SOLN: 650.0000 mg | ORAL | Status: DC | PRN

## 2022-04-15 MED ORDER — SENNOSIDES-DOCUSATE SODIUM 8.6-50 MG PO TABS
1.0000 | ORAL_TABLET | Freq: Every evening | ORAL | Status: DC | PRN
  Administered 2022-04-15: 1 via ORAL
  Filled 2022-04-15: qty 1

## 2022-04-15 MED ORDER — CLOPIDOGREL BISULFATE 75 MG PO TABS: 75.0000 mg | ORAL_TABLET | Freq: Every day | ORAL | Status: DC

## 2022-04-15 MED ORDER — ATORVASTATIN CALCIUM 40 MG PO TABS
40.0000 mg | ORAL_TABLET | Freq: Every day | ORAL | Status: DC
  Administered 2022-04-15: 40 mg via ORAL
  Filled 2022-04-15: qty 1

## 2022-04-15 MED ORDER — ENOXAPARIN SODIUM 40 MG/0.4ML IJ SOSY
40.0000 mg | PREFILLED_SYRINGE | INTRAMUSCULAR | Status: DC
  Administered 2022-04-15: 40 mg via SUBCUTANEOUS
  Filled 2022-04-15: qty 0.4

## 2022-04-15 NOTE — Progress Notes (Signed)
Pt arrived from [facility] in transfer to floor for TIA work up.  On reviewing chart, looks like hes actually an [facility] patient.  Will give [facility] a call to admit patient.

## 2022-04-15 NOTE — ED Notes (Signed)
Pt attempted to use urinal but unable to provide urine sample.

## 2022-04-15 NOTE — Telephone Encounter (Signed)
Please advise if the patient can have a AMB order placed for a Home Study test vs the Consult in which was discussed at the LOV on 04/01/2022 .  Rec'd call from the Pt's Spouse stating he has been sch with the following office per the Referral Request that was sent.   Name: Travis Powell, Zajkowski MRN: [id number]  Date: 05/11/2022 Status: Sch  Time: 11:15 AM Length: 30  Visit Type: SLEEP CONSULT [1170] Copay: $0.00  Provider: [name], MD      Pt is requesting to have  a Home Sleep Study done in place because he does not want to wait for the appt because he wants to proceed with his Weight Loss Surgery by the end of the year the Sleep Study.

## 2022-04-15 NOTE — H&P (Signed)
Date: 04/15/2022               Patient Name:  Travis Powell MRN: [id number]  DOB: 1971/03/19 Age / Sex: 51 y.o., male   PCP: Belva Agee, MD         Medical Service: Internal Medicine Teaching Service         Attending Physician: Dr. Jonah Blue, MD    First Contact: Adron Bene, MD      Pager: [place] [phone]      Second Contact: Quincy Simmonds, MD      Pager: Eustaquio Maize [phone]           After Hours (After 5p/  First Contact Pager: [phone]  weekends / holidays): Second Contact Pager: [phone]   SUBJECTIVE   Chief Complaint: Leg Weakness  History of Present Illness: [name] is a 51 year old male with a past medical history of morbid obesity, hypertension, hyperlipidemia, diabetes, and OSA on CPAP who presents for left leg weakness.  Two days ago on 8-29, the patient awoke around 10:00 and noticed that his left hand was weak. He recalls dropping his phone frequently, but could reach up to grab overhead objects without difficulty. Denies numbness or tingling. He also reports concurrent difficulty with pronouncing certain words, specifically long or complicated words and words that begin with "S". He denies word-finding difficulty. He felt very tired throughout the entire day, though was able to successfully mow his neighbor's lawn at around 13:00 without difficulty. Around 17:00, his speech normalized and left hand symptoms improved with only minor lingering weakness. 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  Upon awakening today 8-31 at 7:00, the patient noticed weakness in his left lower extremity. He reports feeling as though his leg is dragging slightly and that he has to consciously lift that leg higher while walking. He has not notice any other new symptoms. His speech and left hand weakness from the previous two days are still present, but significantly  improved compared to before and are now barely noticeable. He denies any numbness, tingling, fevers, chills, headache, cough, bowel changes, chest pain, visual disturbances, and hearing difficulties. During the last few days, he has had a harder time ambulating to the bathroom and reports some urinary urgency with a few episodes of incontinence, but also reports that this has been a longstanding issue for him. He denies dysuria, stress incontinence, and any changes to the color of his urine.  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 He believes that this may be a contributing factor to his current symptoms. Previously, he had been prescribed medications for hypertension, diabetes, and hyperlipidemia but reports nonadherence to all his medications except amlodipine (used wife's amlodipine) over the last six months through the end of July. In late July, during a clinic visit, he was restarted on these medications. At that time, he was also started on spironolactone. Since then, he reports full adherence to all of his medications.    ED Course: None (direct transfer)   Meds:  Current Meds  Medication Sig   amLODipine (NORVASC) 10 MG tablet Take 1 tablet (10 mg total) by mouth every morning.   atorvastatin (LIPITOR) 40 MG tablet Take 1 tablet (40 mg total) by mouth daily.   losartan (COZAAR) 100 MG tablet Take 1 tablet (100 mg  total) by mouth daily.   metFORMIN (GLUCOPHAGE) 500 MG tablet Take 2 tablets (1,000 mg total) by mouth 2 (two) times daily with a meal.   spironolactone (ALDACTONE) 25 MG tablet Take 1 tablet (25 mg total) by mouth daily.    Past Medical History  Past Surgical History:  Procedure Laterality Date   CARPAL TUNNEL RELEASE      Social:  Lives With: wife and daughter Occupation: Recruitment consultant Support: family Level of Function: fully independent in all ADLs PCP: Dr  Evie Lacks Substances: smokes a cigar every couple weeks, drinks EtOH socially maybe 2 beers per week, never smoked cigarettes, no recreational drug use    Family History: Diabetes and hypertension run in his family Maternal grandmother had an MI   Allergies: Allergies as of 04/15/2022   (No Known Allergies)    Review of Systems: A complete ROS was negative except as per HPI.   OBJECTIVE:   Physical Exam: Blood pressure (!) 160/84, pulse 71, temperature 98.5 F (36.9 C), temperature source Oral, resp. rate 16, height 5' 10.98" (1.803 m), weight (!) 161.6 kg, SpO2 98 %.   General:   awake and alert, lying comfortably in bed, cooperative, not in acute distress Skin:   warm and dry, intact without any obvious lesions, no rashes Head:   normocephalic and atraumatic Eyes:   extraocular movements intact, conjunctivae pink, no periorbital swelling or scleral icterus Lungs:   normal respiratory effort, breathing unlabored, symmetrical chest rise, no crackles or wheezing Cardiac:   regular rate and rhythm, normal S1 and S2, 0-1+ pitting edema bilaterally Abdomen:   soft and non-distended, normoactive bowel sounds present in all four quadrants, no guarding or palpable masses Musculoskeletal:   full range of motion in joints, motor strength 5 /5 in grip, elbow flexion-extension, shoulder shrug, SCM, plantarflexion, dorsiflexion, knee extension, flexion bilaterally Neurologic:   oriented to person-place-time, gait slightly antalgic with favoring of right side, sensation to light touch intact in upper and lower extremities bilaterally, no facial droop, visual fields intact bilaterally, CN II-XII grossly intact, reflexes 2+ throughout Psychiatric:   mood and affect normal, intelligible speech   Labs: CBC    Component Value Date/Time   WBC 9.4 04/15/2022 1221   RBC 4.59 04/15/2022 1221   HGB 12.8 (L) 04/15/2022 1221   HGB 14.0 02/08/2019 1050   HGB 14.1 06/05/2012 0931   HCT 38.5 (L)  04/15/2022 1221   HCT 42.8 02/08/2019 1050   HCT 44.3 06/05/2012 0931   PLT 269 04/15/2022 1221   PLT 280 02/08/2019 1050   MCV 83.9 04/15/2022 1221   MCV 85 02/08/2019 1050   MCV 86.3 06/05/2012 0931   MCH 27.9 04/15/2022 1221   MCHC 33.2 04/15/2022 1221   RDW 14.2 04/15/2022 1221   RDW 14.5 02/08/2019 1050   RDW 14.6 06/05/2012 0931   LYMPHSABS 3.9 04/15/2022 1221   LYMPHSABS 3.8 (H) 02/08/2019 1050   LYMPHSABS 4.6 (H) 06/05/2012 0931   MONOABS 0.6 04/15/2022 1221   MONOABS 0.6 06/05/2012 0931   EOSABS 0.3 04/15/2022 1221   EOSABS 0.3 02/08/2019 1050   BASOSABS 0.0 04/15/2022 1221   BASOSABS 0.1 02/08/2019 1050   BASOSABS 0.1 06/05/2012 0931     CMP     Component Value Date/Time   NA 137 04/15/2022 1221   NA 140 03/11/2022 0933   NA 139 06/05/2012 0931   K 3.4 (L) 04/15/2022 1221   K 3.7 06/05/2012 0931   CL 101 04/15/2022 1221   CL  103 06/05/2012 0931   CO2 26 04/15/2022 1221   CO2 25 06/05/2012 0931   GLUCOSE 144 (H) 04/15/2022 1221   GLUCOSE 118 (H) 06/05/2012 0931   BUN 10 04/15/2022 1221   BUN 16 03/11/2022 0933   BUN 8.0 06/05/2012 0931   CREATININE 0.97 04/15/2022 1221   CREATININE 1.0 06/05/2012 0931   CALCIUM 9.1 04/15/2022 1221   CALCIUM 9.9 06/05/2012 0931   PROT 7.6 04/15/2022 1221   PROT 7.6 03/11/2022 0933   PROT 7.8 06/05/2012 0931   ALBUMIN 4.2 04/15/2022 1221   ALBUMIN 4.4 03/11/2022 0933   ALBUMIN 4.2 06/05/2012 0931   AST 19 04/15/2022 1221   AST 20 06/05/2012 0931   ALT 17 04/15/2022 1221   ALT 23 06/05/2012 0931   ALKPHOS 65 04/15/2022 1221   ALKPHOS 70 06/05/2012 0931   BILITOT 0.6 04/15/2022 1221   BILITOT 0.4 03/11/2022 0933   BILITOT 0.60 06/05/2012 0931   GFRNONAA >60 04/15/2022 1221   GFRAA 111 02/08/2019 1050    Imaging: CT HEAD WO CONTRAST  Result Date: 04/15/2022 CLINICAL DATA:  Left arm weakness. EXAM: CT HEAD WITHOUT CONTRAST TECHNIQUE: Contiguous axial images were obtained from the base of the skull through the  vertex without intravenous contrast. RADIATION DOSE REDUCTION: This exam was performed according to the departmental dose-optimization program which includes automated exposure control, adjustment of the mA and/or kV according to patient size and/or use of iterative reconstruction technique. COMPARISON:  05/18/2012 FINDINGS: Brain: There is no evidence for acute hemorrhage, hydrocephalus, mass lesion, or abnormal extra-axial fluid collection. No definite CT evidence for acute infarction. Vascular: No hyperdense vessel or unexpected calcification. Skull: No evidence for fracture. No worrisome lytic or sclerotic lesion. Sinuses/Orbits: The visualized paranasal sinuses and mastoid air cells are clear. Visualized portions of the globes and intraorbital fat are unremarkable. Other: None. IMPRESSION: Stable.  No acute intracranial abnormality. Electronically Signed   By: Kennith Center M.D.   On: 04/15/2022 12:44      EKG: personally reviewed my interpretation is normal sinus rhythm.    ASSESSMENT & PLAN:   Assessment & Plan by Problem: Principal Problem:   TIA (transient ischemic attack) Active Problems:   DM2 (diabetes mellitus, type 2) (HCC)   HTN (hypertension), benign   OSA (obstructive sleep apnea)   Hyperlipidemia   FRANCISCOJAVIER WRONSKI is a 51 y.o. person living with a history of hyperlipidemia, hypertension, diabetes-II, morbid obesity, and OSA on CPAP who presented with left lower extremity weakness and admitted for acute neurological deficits on hospital day 0   #Acute neurological deficits, likely TIA Left hand weakness and speech difficulty that started two days ago and has since resolved. Now presenting with lower left extremity weakness. Non-contrast head CT negative. Neurological exam without any focal neurologic deficits, including motor strength in left lower extremity. Slight antalgic gait favoring right side. Differentials include ischemic stroke vs TIA, likely the latter. Last A1C of  7.4 and LDL 209 as of one month ago. It is unclear how well-controlled his blood pressure is at home. Received loading doses of aspirin and clopidogrel upon arrival to [facility] His ABCD2 score is 6 (1 hypertension, 2 unilateral leg weakness, 2 duration >12min, 1 diabetes), which is indicative of 8% two-day stroke risk and qualifies him for DAPT.  > Order echocardiogram and fasting lipid panel > Prescribe aspirin 81mg  and clopidogrel 75mg  daily > Continue atorvastatin 40mg  daily > Neurology consulted, appreciate recs > Cardiac telemetry > Passed bedside swallow study, started on  heart-healthy and carb-modified diet > Pending PT/OT/SLP evaluation > Continue DAPT for three weeks followed by aspirin monotherapy   #Type II diabetes with hyperglycemia Patient's last A1C was 7.4 on 7-27. He takes metformin 500mg  q12 at home. > Order sliding scale insulin for glycemic control   #Hypertension #Hyperlipidemia Managed at home with amlodipine 10mg , losartan 100mg , and spironolactone 25mg  daily. Most recent lipid panel collected 7-27 revealed total cholesterol 271, HDL 43, and LDL 209. Patient previously on atorvastatin 20mg , which was increased to 40mg  on 8-17.  > Continue atorvastatin 40mg  q24 > Hold at-home medications for permissive hypertension, goal <220/120   #Morbid Obesity #OSA on CPAP Patient has a BMI of 49.7. Has been using CPAP at home, over five years since his last sleep study so will need another one soon. Currently pursuing bariatric surgery options with Dr at [facility] for weight loss. > Continue CPAP qHS   #Vitamin D deficiency Laboratory testing on 6-26 at [facility] revealed vitamin D level of 9. Patient has not been prescribed dietary supplementation. > Prescribe vitamin D after discharge in the outpatient setting   #Urinary Urgency Patient reports urinary urgency and leakage of urine into his underwear, neither of which are new as of the last three days. He does report increased  urgency, possibly from slower ambulation to the bathroom secondary to lower extremity weakness. > Monitor symptoms during hospitalization and follow-up outpatient    Diet: Heart Healthy VTE: Enoxaparin IVF: None,None Code: Full  Prior to Admission Living Arrangement: Home, living with wife and daughter Anticipated Discharge Location: Home Barriers to Discharge: Lower left extremity weakness  Dispo: Admit patient to Observation with expected length of stay less than 2 midnights.   Signed: , MD Internal Medicine Resident PGY-1  04/15/2022, 10:55 PM

## 2022-04-15 NOTE — Hospital Course (Addendum)
51 yo M with h/o DM; HTN; morbid obesity (looks like hes trying to have bariatric surgery later this year); and OSA on CPAP presenting with L hand weakness, speech changes. Symptoms occurred 2 days ago, resolved. Today with L leg weakness with normal exam. Given CVD RF, he is recommended for TIA work-up. Neurology is aware and will see patient upon arrival.   Admitted for TIA workup.  ___________________________________  This past Tuesday morning when he woke up around 10AM, noticed that his left hand was weak and was having difficulty speaking. Was not having difficulty with word-finding. States that he was having difficulty with saying words that began with "S" and hard to say longer/difficult words. Felt his left arm was weak, was able to raise it up to grab items but would drop his phone frequently. Denies any numbness at that time. Symptoms lasted all day. Felt very tired later in the day. Was able to walk around fine, cut his neighbors grass around 1PM with a lawnmower, was able to grip with his left hand fine. Was tired thereafter. Symptoms improved around 5PM in regards to speech. States that his weakness did not go away completely, even today his left arm/hand did not feel completely normal (works as a Marine scientist). Left arm weakness does seem to have improved, not dropping his phone anymore and is able to clean the cars. Feels like just having some subtle weakness remaining in the L arm.   L leg started feeling weak today at around 7AM. When he walks, feels like it is dragging. L leg not raising up as comfortably as he usually does. No other focal deficits.   Denies any numbness, tingling, fevers, chills, headache, cough, constipation, diarrhea, chest pain, blurry vision, hearing.  Did feel some urinary urgency and had a couple of episodes of incontinence, but this appears to be chronic. Last couple of days though, he cannot seem to hold his urine to make it to bathroom in time. No burning with  urination, pain, or change in urine color. Straining does not cause urinary leakage.  Started a small business recently, has been getting very stressed with things dealing with it. Wondering if this could have contributed.  Just restarted metformin about a month and a half ago. Currently takes 1 tablet in the morning and one in the evening. Takes lipitor at bedtime. Takes amlodipine, losartan, and recently started spironolactone a couple of weeks ago. Was not taking any of his medications for about 6 months prior to July (was only taking his wife's amlodipine) -- felt like he did not need to.    Diabetes and HTN runs in the family. Maternal grandmother had an MI.  Never smoked cigarettes. Smokes a cigar once every couple of weeks. Drinks alcohol socially. No recreational drug use.   Lives with wife and daughter. Independent in ADLs and iADLs.  Works as an Engineer, materials.
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
## 2022-04-15 NOTE — ED Provider Notes (Signed)
[facility] EMERGENC[facility] DEPT Provider Note   CSN: [id number] Arrival date & time: 04/15/22  1150     History Past medical history includes hypertension, diabetes type 2, OSA, obesity, hyperlipidemia Chief Complaint  Patient presents with   Weakness    Travis Powell is a 51 y.o. male. Patient presents with left-sided weakness.  He states Tuesday morning around 10 AM he noticed weakness in his left hand.  He felt like he could not write normally and something just fell off.  He did not notice any further weakness in his left elbow or shoulder though.  He said at the time he had associated speech changes where he was having trouble enunciating his words.  He says the symptoms have actually improved, but he is still noticing some lingering weakness and speech deficits.  He states that this morning when he woke up from bed he said that he felt that his left leg was weaker than normal.  He states that he really only noticed this when he got up and walked out of bed.  He says the symptoms have been constant since he woke up.  When he went to bed last night he felt completely normal. He denies any visual changes, dizziness, balance issues, nausea, vomiting, chest pain, shortness of breath, numbness, tingling, neck pain, back pain, headache, or any other myalgias.   Weakness      Home Medications Prior to Admission medications   Medication Sig Start Date End Date Taking? Authorizing Provider  amLODipine (NORVASC) 10 MG tablet Take 1 tablet (10 mg total) by mouth every morning. 03/11/22   Leigh Aurora, DO  atorvastatin (LIPITOR) 40 MG tablet Take 1 tablet (40 mg total) by mouth daily. 04/01/22 04/01/23  Farrel Gordon, DO  losartan (COZAAR) 100 MG tablet Take 1 tablet (100 mg total) by mouth daily. 03/11/22   Leigh Aurora, DO  metFORMIN (GLUCOPHAGE) 500 MG tablet Take 2 tablets (1,000 mg total) by mouth 2 (two) times daily with a meal. 04/06/22   Katsadouros, Vasilios, MD  spironolactone  (ALDACTONE) 25 MG tablet Take 1 tablet (25 mg total) by mouth daily. 04/01/22 04/01/23  Farrel Gordon, DO      Allergies    Patient has no known allergies.    Review of Systems   Review of Systems  Neurological:  Positive for weakness.  All other systems reviewed and are negative.   Physical Exam Updated Vital Signs BP (!) 150/73   Pulse 76   Temp 98.2 F (36.8 C)   Resp 20   Ht 5\' 11"  (1.803 m)   Wt (!) 163.3 kg   SpO2 98%   BMI 50.21 kg/m  Physical Exam Vitals and nursing note reviewed.  Constitutional:      General: He is not in acute distress.    Appearance: Normal appearance. He is well-developed. He is not ill-appearing, toxic-appearing or diaphoretic.  HENT:     Head: Normocephalic and atraumatic.     Nose: No nasal deformity.     Mouth/Throat:     Lips: Pink. No lesions.  Eyes:     General: Gaze aligned appropriately. No scleral icterus.       Right eye: No discharge.        Left eye: No discharge.     Extraocular Movements: Extraocular movements intact.     Conjunctiva/sclera: Conjunctivae normal.     Right eye: Right conjunctiva is not injected. No exudate or hemorrhage.    Left eye: Left conjunctiva is not  injected. No exudate or hemorrhage.    Pupils: Pupils are equal, round, and reactive to light.  Cardiovascular:     Rate and Rhythm: Normal rate and regular rhythm.     Pulses: Normal pulses.     Heart sounds: Normal heart sounds. No murmur heard.    No friction rub. No gallop.  Pulmonary:     Effort: Pulmonary effort is normal. No respiratory distress.     Breath sounds: Normal breath sounds. No stridor. No wheezing, rhonchi or rales.  Abdominal:     General: Abdomen is flat. There is no distension.     Palpations: Abdomen is soft.     Tenderness: There is no abdominal tenderness. There is no right CVA tenderness, left CVA tenderness, guarding or rebound.  Skin:    General: Skin is warm and dry.  Neurological:     Mental Status: He is alert and  oriented to person, place, and time.     Comments: Alert and Oriented x 3 Speech clear with no aphasia Cranial Nerve testing - Visual Fields grossly intact - PERRLA. EOM intact. No Nystagmus - Facial Sensation grossly intact - No facial asymmetry - Accessory Muscles intact Motor: - 5/5 motor strength in all four extremities.  - No pronator Drift Sensation: - Grossly intact in all four extremities.  Coordination:  - Finger to nose and heel to shin intact bilaterally  Psychiatric:        Mood and Affect: Mood normal.        Speech: Speech normal.        Behavior: Behavior normal. Behavior is cooperative.     ED Results / Procedures / Treatments   Labs (all labs ordered are listed, but only abnormal results are displayed) Labs Reviewed  CBC - Abnormal; Notable for the following components:      Result Value   Hemoglobin 12.8 (*)    HCT 38.5 (*)    All other components within normal limits  COMPREHENSIVE METABOLIC PANEL - Abnormal; Notable for the following components:   Potassium 3.4 (*)    Glucose, Bld 144 (*)    All other components within normal limits  CBG MONITORING, ED - Abnormal; Notable for the following components:   Glucose-Capillary 105 (*)    All other components within normal limits  RESP PANEL BY RT-PCR (FLU A&B, COVID) ARPGX2  ETHANOL  PROTIME-INR  APTT  DIFFERENTIAL  RAPID URINE DRUG SCREEN, HOSP PERFORMED  URINALYSIS, ROUTINE W REFLEX MICROSCOPIC    EKG EKG Interpretation  Date/Time:  Thursday April 15 2022 12:04:49 EDT Ventricular Rate:  78 PR Interval:  188 QRS Duration: 98 QT Interval:  384 QTC Calculation: 437 R Axis:   -22 Text Interpretation: Normal sinus rhythm (cited on or before 02-Mar-2022) Abnormal ECG When compared with ECG of 02-Mar-2022 11:17, No significant change was found Confirmed by Virgina Norfolk ([id number]) on 04/15/2022 12:07:18 PM  Radiology CT HEAD WO CONTRAST  Result Date: 04/15/2022 CLINICAL DATA:  Left arm weakness.  EXAM: CT HEAD WITHOUT CONTRAST TECHNIQUE: Contiguous axial images were obtained from the base of the skull through the vertex without intravenous contrast. RADIATION DOSE REDUCTION: This exam was performed according to the departmental dose-optimization program which includes automated exposure control, adjustment of the mA and/or kV according to patient size and/or use of iterative reconstruction technique. COMPARISON:  05/18/2012 FINDINGS: Brain: There is no evidence for acute hemorrhage, hydrocephalus, mass lesion, or abnormal extra-axial fluid collection. No definite CT evidence for acute infarction. Vascular: No hyperdense  vessel or unexpected calcification. Skull: No evidence for fracture. No worrisome lytic or sclerotic lesion. Sinuses/Orbits: The visualized paranasal sinuses and mastoid air cells are clear. Visualized portions of the globes and intraorbital fat are unremarkable. Other: None. IMPRESSION: Stable.  No acute intracranial abnormality. Electronically Signed   By: Misty Stanley M.D.   On: 04/15/2022 12:44    Procedures Procedures  This patient was on telemetry or cardiac monitoring during their time in the ED.    Medications Ordered in ED Medications  aspirin tablet 325 mg (325 mg Oral Given 04/15/22 1345)  clopidogrel (PLAVIX) tablet 300 mg (300 mg Oral Given 04/15/22 1345)    ED Course/ Medical Decision Making/ A&P Clinical Course as of 04/15/22 1415  Thu Apr 15, 2022  1251 Patient with transient left hand weakness and speech changes 3 days ago. Today woke up with subjective left leg weakness at 0830 that he notices when he walks. I am not able to appreciate any neurological deficits on exam. Consulting Neurology to see if they want to admit for TIA workup versus ED to ED transfer for MRI. [GL]  1334 I discussed case with Dr. Leonel Ramsay and he recommends admission for TIA workup [GL]  1414 Discussed case with Dr. Lorin Mercy from [facility]. Plan for admission.  [GL]    Clinical Course User  Index [GL] Sherre Poot Adora Fridge, PA-C                           Medical Decision Making Amount and/or Complexity of Data Reviewed Labs: ordered. Radiology: ordered.  Risk OTC drugs. Prescription drug management. Decision regarding hospitalization.    MDM  This is a 51 y.o. male who presents to the ED with left sided weakness The differential of this patient includes but is not limited to CVA, Intracranial bleed, Seizure, TIA, Spinal Cord Process, Peripheral nerve impingement  Initial Impression  Well appearing, no acute distress. Stable vitals. BP 152/102 Neuro exam completely intact without any appreciable weakness or ataxia. Speech completely clear. Patient states that he continues to feel the weakness though. He also had a episode of incontinence in the bathroom which is abnormal for him. Stroke labs  Head CT ordered. Plan to call neuro to see if they want to admit for TIA versus ED to ED transfer for MRI.   I personally ordered, reviewed, and interpreted all laboratory work and imaging and agree with radiologist interpretation. Results interpreted below:  CBC reveals anemia, hgb 12.8, no leukocytosis. CMP without hypoglycemia. K is 3.4. Stable kidney function. LFTs okay. No acidosis. ETOH negative CT head without acute abnormalities  Assessment/Plan:  Patient is presenting with symptoms concerning for TIA as well as new subjective left leg weakness.  He is out of the stroke window.  He has no objective neurological findings on exam.  We did initial CT head that was negative.  His labs do not suggest any alternate cause to symptoms. I discussed this case with Dr. Leonel Ramsay who recommends admission for TIA work-up given patient with multiple risk factors including diabetes, uncontrolled hypertension, hyperlipidemia, obesity, and age. He also recommends loading dose of plavix and aspirin in ED.   I have discussed recommendations with patient who is agreeable to admission.  I  have placed call to hospitalist team.    Charting Requirements Additional history is obtained from:  Independent historian External Records from outside source obtained and reviewed including: Prior labs, recent visit for OSA Social Determinants of Health:  none Pertinant PMH that complicates patient's illness: HTN, HLD, DM, Obesity  Patient Care Problems that were addressed during this visit: - Left sided weakness: Acute illness with complication This patient was maintained on a cardiac monitor/telemetry. I personally viewed and interpreted the cardiac monitor which reveals an underlying rhythm of NSR Medications given in ED: ASA, Plavix Reevaluation of the patient after these medicines showed that the patient stayed the same I have reviewed home medications and made changes accordingly.  Critical Care Interventions: n/a Consultations: Neurology, [facility] Disposition: Admit  This is a supervised visit with my attending physician, Dr. Venida Jarvis. We have discussed this patient and they have altered the plan as needed.  Portions of this note were generated with Scientist, clinical (histocompatibility and immunogenetics). Dictation errors may occur despite best attempts at proofreading.    Final Clinical Impression(s) / ED Diagnoses Final diagnoses:  TIA (transient ischemic attack)    Rx / DC Orders ED Discharge Orders     None         Claudie Leach, PA-C 04/15/22 1415    Virgina Norfolk, DO 04/15/22 1458

## 2022-04-15 NOTE — ED Triage Notes (Signed)
Patient here POV from Home.  Endorses waking Up Tuesday AM at 1000 with Left Arm Weakness (No Numbness). Consistent Mostly since it began and states he began to have Left Leg Weakness that began this AM.   No Pain, No SOB, No N/V/D. No Numbness. BEFAST Negative. No Facial Droop.   NAD Noted during Triage. A&Ox4. GCS 15. Ambulatory.

## 2022-04-15 NOTE — Progress Notes (Signed)
Plan of Care Note for accepted transfer   Patient: Travis Powell MRN: [id number]   DOA: 04/15/2022  Facility requesting transfer: [facility] Requesting Provider: Lockie Mola Reason for transfer: TIA evaluation Facility course: Patient with h/o DM; HTN; morbid obesity; and OSA on CPAP presenting with L hand weakness, speech changes.  Symptoms occurred 2 days ago, resolved.  Today with L leg weakness with normal exam.  Given CVD RF, he is recommended for TIA work-up.  Neurology is aware and will see patient upon arrival.   Plan of care: The patient is accepted for admission to Telemetry unit, at [facility].    Author: Jonah Blue, MD 04/15/2022  Check [email or web address] for on-call coverage.  Nursing staff, Please call [facility] Admits & Consults System-Wide number on Amion as soon as patient's arrival, so appropriate admitting provider can evaluate the pt.

## 2022-04-16 ENCOUNTER — Observation Stay (HOSPITAL_COMMUNITY): Payer: BC Managed Care – PPO

## 2022-04-16 ENCOUNTER — Observation Stay (HOSPITAL_BASED_OUTPATIENT_CLINIC_OR_DEPARTMENT_OTHER): Payer: BC Managed Care – PPO

## 2022-04-16 DIAGNOSIS — R29818 Other symptoms and signs involving the nervous system: Secondary | ICD-10-CM | POA: Diagnosis not present

## 2022-04-16 DIAGNOSIS — I6389 Other cerebral infarction: Secondary | ICD-10-CM

## 2022-04-16 DIAGNOSIS — G9389 Other specified disorders of brain: Secondary | ICD-10-CM | POA: Diagnosis not present

## 2022-04-16 DIAGNOSIS — R29898 Other symptoms and signs involving the musculoskeletal system: Secondary | ICD-10-CM | POA: Diagnosis not present

## 2022-04-16 DIAGNOSIS — G459 Transient cerebral ischemic attack, unspecified: Secondary | ICD-10-CM | POA: Diagnosis not present

## 2022-04-16 DIAGNOSIS — I6523 Occlusion and stenosis of bilateral carotid arteries: Secondary | ICD-10-CM | POA: Diagnosis not present

## 2022-04-16 DIAGNOSIS — I771 Stricture of artery: Secondary | ICD-10-CM | POA: Diagnosis not present

## 2022-04-16 DIAGNOSIS — R531 Weakness: Secondary | ICD-10-CM | POA: Diagnosis not present

## 2022-04-16 LAB — CBC
HCT: 39.8 % (ref 39.0–52.0)
Hemoglobin: 13.3 g/dL (ref 13.0–17.0)
MCH: 27.9 pg (ref 26.0–34.0)
MCHC: 33.4 g/dL (ref 30.0–36.0)
MCV: 83.6 fL (ref 80.0–100.0)
Platelets: 260 10*3/uL (ref 150–400)
RBC: 4.76 MIL/uL (ref 4.22–5.81)
RDW: 14.2 % (ref 11.5–15.5)
WBC: 8.4 10*3/uL (ref 4.0–10.5)
nRBC: 0 % (ref 0.0–0.2)

## 2022-04-16 LAB — BASIC METABOLIC PANEL
Anion gap: 12 (ref 5–15)
BUN: 9 mg/dL (ref 6–20)
CO2: 23 mmol/L (ref 22–32)
Calcium: 8.5 mg/dL — ABNORMAL LOW (ref 8.9–10.3)
Chloride: 102 mmol/L (ref 98–111)
Creatinine, Ser: 0.98 mg/dL (ref 0.61–1.24)
GFR, Estimated: >60 mL/min (ref >60)
Glucose, Bld: 162 mg/dL — ABNORMAL HIGH (ref 70–99)
Potassium: 3.3 mmol/L — ABNORMAL LOW (ref 3.5–5.1)
Sodium: 137 mmol/L (ref 135–145)

## 2022-04-16 LAB — LIPID PANEL
Cholesterol: 214 mg/dL — ABNORMAL HIGH (ref 0–200)
HDL: 41 mg/dL (ref >40)
LDL Cholesterol: 157 mg/dL — ABNORMAL HIGH (ref 0–99)
Total CHOL/HDL Ratio: 5.2 RATIO
Triglycerides: 79 mg/dL (ref <150)
VLDL: 16 mg/dL (ref 0–40)

## 2022-04-16 LAB — ECHOCARDIOGRAM COMPLETE BUBBLE STUDY
AR max vel: 3.34 cm2
AV Peak grad: 6.6 mmHg
Ao pk vel: 1.29 m/s
Area-P 1/2: 5.66 cm2
S' Lateral: 2.6 cm

## 2022-04-16 LAB — HEMOGLOBIN A1C
Hgb A1c MFr Bld: 7.7 % — ABNORMAL HIGH (ref 4.8–5.6)
Mean Plasma Glucose: 174.29 mg/dL

## 2022-04-16 LAB — GLUCOSE, CAPILLARY
Glucose-Capillary: 159 mg/dL — ABNORMAL HIGH (ref 70–99)
Glucose-Capillary: 197 mg/dL — ABNORMAL HIGH (ref 70–99)
Glucose-Capillary: 200 mg/dL — ABNORMAL HIGH (ref 70–99)

## 2022-04-16 MED ORDER — ATORVASTATIN CALCIUM 80 MG PO TABS: 80.0000 mg | ORAL_TABLET | Freq: Every day | ORAL | Status: DC

## 2022-04-16 MED ORDER — ATORVASTATIN CALCIUM 80 MG PO TABS
80.0000 mg | ORAL_TABLET | Freq: Every day | ORAL | 0 refills | Status: DC
Start: 2022-04-16 — End: 2022-05-10

## 2022-04-16 MED ORDER — LORAZEPAM 2 MG/ML IJ SOLN
2.0000 mg | Freq: Once | INTRAMUSCULAR | Status: AC | PRN
  Administered 2022-04-16: 2 mg via INTRAVENOUS
  Filled 2022-04-16: qty 1

## 2022-04-16 MED ORDER — IOHEXOL 350 MG/ML SOLN
100.0000 mL | Freq: Once | INTRAVENOUS | Status: AC | PRN
  Administered 2022-04-16: 100 mL via INTRAVENOUS

## 2022-04-16 MED ORDER — ASPIRIN 81 MG PO TBEC: 81.0000 mg | DELAYED_RELEASE_TABLET | Freq: Every day | ORAL | 12 refills | Status: DC

## 2022-04-16 MED ORDER — POTASSIUM CHLORIDE CRYS ER 20 MEQ PO TBCR
40.0000 meq | EXTENDED_RELEASE_TABLET | Freq: Once | ORAL | Status: AC
Start: 2022-04-16 — End: 2022-04-16
  Administered 2022-04-16: 40 meq via ORAL
  Filled 2022-04-16: qty 2

## 2022-04-16 MED ORDER — CLOPIDOGREL BISULFATE 75 MG PO TABS
75.0000 mg | ORAL_TABLET | Freq: Every day | ORAL | 0 refills | Status: AC
Start: 2022-04-17 — End: 2022-05-08

## 2022-04-16 NOTE — Progress Notes (Signed)
OT Cancellation Note  Patient Details Name: Travis Powell MRN: [id number] DOB: 10-Feb-1971   Cancelled Treatment:    Reason Eval/Treat Not Completed: Patient at procedure or test/ unavailable (ECHO). Will return as schedule allows.   Hortence Charter M Mikelle Myrick Kensi Karr MSOT, OTR/L Acute Rehab Office: [phone] 04/16/2022, 10:24 AM

## 2022-04-16 NOTE — Progress Notes (Addendum)
STROKE TEAM PROGRESS NOTE   INTERVAL HISTORY Patient is seen in his room with no family at the bedside.  Yesterday, he woke up with slurred speech and left sided weakness and clumsiness.  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 LDL cholesterol elevated 157 mg percent and hemoglobin A1c at 7.4.  Carotid ultrasound shows no significant extracranial stenosis.  2D echo is pending Vitals:   04/15/22 1904 04/15/22 2059 04/15/22 2212 04/16/22 0400  BP: (!) 160/84  136/67 (!) 162/85  Pulse: 71 72 76 83  Resp: 16 16 (!) 22 20  Temp: 98.5 F (36.9 C)  98.6 F (37 C) 98 F (36.7 C)  TempSrc: Oral  Oral Oral  SpO2: 98% 98% 98% 98%  Weight:      Height:       CBC:  Recent Labs  Lab 04/15/22 1221 04/16/22 0517  WBC 9.4 8.4  NEUTROABS 4.5  --   HGB 12.8* 13.3  HCT 38.5* 39.8  MCV 83.9 83.6  PLT 269 260   Basic Metabolic Panel:  Recent Labs  Lab 04/15/22 1221 04/16/22 0517  NA 137 137  K 3.4* 3.3*  CL 101 102  CO2 26 23  GLUCOSE 144* 162*  BUN 10 9  CREATININE 0.97 0.98  CALCIUM 9.1 8.5*   Lipid Panel:  Recent Labs  Lab 04/16/22 0517  CHOL 214*  TRIG 79  HDL 41  CHOLHDL 5.2  VLDL 16  LDLCALC 161157*   HgbA1c:  Recent Labs  Lab 04/16/22 0517  HGBA1C 7.7*   Urine Drug Screen:  Recent Labs  Lab 04/15/22 1415  LABOPIA NONE DETECTED  COCAINSCRNUR NONE DETECTED  LABBENZ NONE DETECTED  AMPHETMU NONE DETECTED  THCU NONE DETECTED  LABBARB NONE DETECTED    Alcohol Level  Recent Labs  Lab 04/15/22 1221  ETH <10    IMAGING past 24 hours VAS US CAROTID  Result Date: 04/16/2022 Carotid Arterial Duplex Study Patient Name:  Travis Powell  Date of Exam:   04/16/2022 Medical Rec #: [id number]       Accession #:    [id number] Date of Birth: [birth year]       Patient Gender: M Patient Age:   [age] years [facility] Procedure:      VAS US CAROTID Referring Phys: Dewitt HoesORTNEY DE LA TORRE  --------------------------------------------------------------------------------  Indications:  CVA. Risk Factors: Hypertension, hyperlipidemia, Diabetes, current smoker, prior               CVA. Limitations   Today's exam was limited due to the body habitus of the patient. Performing Technologist: Magdalene RiverJennifer Redd  Examination Guidelines: A complete evaluation includes B-mode imaging, spectral Doppler, color Doppler, and power Doppler as needed of all accessible portions of each vessel. Bilateral testing is considered an integral part of a complete examination. Limited examinations for reoccurring indications may be performed as noted.  Right Carotid Findings: +----------+--------+--------+--------+------------------+--------+           PSV cm/sEDV cm/sStenosisPlaque DescriptionComments +----------+--------+--------+--------+------------------+--------+ CCA Prox  88      11                                         +----------+--------+--------+--------+------------------+--------+ CCA Distal78      12              calcific                   +----------+--------+--------+--------+------------------+--------+  ICA Prox  126     14              heterogenous               +----------+--------+--------+--------+------------------+--------+ ICA Distal117     34                                         +----------+--------+--------+--------+------------------+--------+ ECA       55      13              heterogenous               +----------+--------+--------+--------+------------------+--------+ +----------+--------+-------+----------------+-------------------+           PSV cm/sEDV cmsDescribe        Arm Pressure (mmHG) +----------+--------+-------+----------------+-------------------+ [id number]            Multiphasic, WNL                    +----------+--------+-------+----------------+-------------------+ +---------+--------+--+--------+--+---------+ VertebralPSV  cm/s36EDV cm/s12Antegrade +---------+--------+--+--------+--+---------+  Left Carotid Findings: +----------+--------+--------+--------+------------------+------------------+           PSV cm/sEDV cm/sStenosisPlaque DescriptionComments           +----------+--------+--------+--------+------------------+------------------+ CCA Prox  121     16                                                   +----------+--------+--------+--------+------------------+------------------+ CCA Distal51      12                                intimal thickening +----------+--------+--------+--------+------------------+------------------+ ICA Prox  43      17              heterogenous                         +----------+--------+--------+--------+------------------+------------------+ ICA Distal119     49                                                   +----------+--------+--------+--------+------------------+------------------+ ECA       90      12              heterogenous                         +----------+--------+--------+--------+------------------+------------------+ +----------+--------+--------+----------------+-------------------+           PSV cm/sEDV cm/sDescribe        Arm Pressure (mmHG) +----------+--------+--------+----------------+-------------------+ Subclavian170             Multiphasic, WNL                    +----------+--------+--------+----------------+-------------------+ +---------+--------+--+--------+--+---------+ VertebralPSV cm/s36EDV cm/s11Antegrade +---------+--------+--+--------+--+---------+   Summary: Right Carotid: Velocities in the right ICA are consistent with a 1-39% stenosis. Left Carotid: Velocities in the left ICA are consistent with a 1-39% stenosis. Vertebrals:  Bilateral vertebral arteries demonstrate antegrade flow. Subclavians: Normal flow hemodynamics were seen in bilateral subclavian  arteries. *See table(s) above  for measurements and observations.  Electronically signed by Delia Heady MD on 04/16/2022 at 2:41:18 PM.    Final    MR BRAIN WO CONTRAST  Result Date: 04/16/2022 CLINICAL DATA:  Left leg weakness EXAM: MRI HEAD WITHOUT CONTRAST TECHNIQUE: Multiplanar, multiecho pulse sequences of the brain and surrounding structures were obtained without intravenous contrast. COMPARISON:  Noncontrast CT head 1 day prior, same day CTA head and neck FINDINGS: Brain: There are small foci of diffusion restriction in the right pons with associated FLAIR signal abnormality consistent with acute to early subacute infarct. There is no hemorrhage or mass effect. There is no other evidence of acute infarct. There is no acute intracranial hemorrhage or extra-axial fluid collection. Ventricles are stable in size. Gray-white differentiation is preserved. Remote infarcts are seen in the left frontal lobe periventricular white matter and corona radiata. There are additional small foci of FLAIR signal abnormality in the supratentorial brain likely reflecting underlying chronic small vessel ischemic change. There is a single punctate chronic microhemorrhage in the posterior limb of the left internal capsule, nonspecific. There is no mass lesion.  There is no mass effect or midline shift. Vascular: Normal flow voids. Skull and upper cervical spine: Normal marrow signal. Sinuses/Orbits: The paranasal sinuses are clear. The globes and orbits are unremarkable Other: None. IMPRESSION: 1. Small acute right pontine infarcts without hemorrhage or mass effect. 2. Small remote frontal lobe infarcts. Electronically Signed   By: Lesia Hausen M.D.   On: 04/16/2022 12:51   CT ANGIO HEAD NECK W WO CM  Result Date: 04/16/2022 CLINICAL DATA:  51 year old male with neurologic deficit, left arm weakness. EXAM: CT ANGIOGRAPHY HEAD AND NECK TECHNIQUE: Multidetector CT imaging of the head and neck was performed using the standard protocol during bolus  administration of intravenous contrast. Multiplanar CT image reconstructions and MIPs were obtained to evaluate the vascular anatomy. Carotid stenosis measurements (when applicable) are obtained utilizing NASCET criteria, using the distal internal carotid diameter as the denominator. RADIATION DOSE REDUCTION: This exam was performed according to the departmental dose-optimization program which includes automated exposure control, adjustment of the mA and/or kV according to patient size and/or use of iterative reconstruction technique. CONTRAST:  OMNIPAQUE IOHEXOL 350 MG/ML SOLN COMPARISON:  Head CT 04/15/2022.  Brain MRI 01/28/2014. FINDINGS: CT HEAD Brain: Chronic appearing left corona radiata lacunar infarct is new since 2015 but appears stable from the CT yesterday (series 7, image 19). Small cystic areas of left frontal horn region white matter encephalomalacia are stable since 2015. Stable gray-white matter differentiation throughout the brain. No midline shift, ventriculomegaly, mass effect, evidence of mass lesion, intracranial hemorrhage or evidence of cortically based acute infarction. Calvarium and skull base: No acute osseous abnormality identified. Paranasal sinuses: Visualized paranasal sinuses and mastoids are stable and well aerated. Orbits: No acute orbit or scalp soft tissue finding. CTA NECK Skeleton: Impacted right mandible wisdom tooth. Cervical spine disc and endplate degeneration. No acute osseous abnormality identified. Upper chest: Negative. Other neck: Retropharyngeal course of both carotids. No discrete neck mass or lymphadenopathy. Aortic arch: Suboptimal arterial contrast timing. Three vessel arch configuration. No significant arch atherosclerosis. Right carotid system: Mildly tortuous brachiocephalic artery and proximal right CCA. Retropharyngeal course of the right carotid. Calcified right ICA origin. Less than 50 % stenosis with respect to the distal vessel. Left carotid system:  Retropharyngeal left carotid and bifurcation. Minimal calcified plaque with no stenosis apparent. Vertebral arteries: Limited evaluation due to venous dominant contrast timing.  Both cervical vertebral arteries appear patent to the skull base. CTA HEAD Venous dominant intracranial contrast timing. Posterior circulation: Patent distal vertebral arteries with calcified plaque more apparent on the left. Patent vertebrobasilar junction and basilar artery. SCA and PCA origins appear patent. Bilateral PCA branches appear symmetric. Anterior circulation: Both ICA siphons are patent with calcified atherosclerosis. Limited siphon detail, no high-grade stenosis suspected on the left. But there is at least moderate right ICA supraclinoid stenosis. Patent carotid termini, MCA and ACA origins. Dominant left ACA A1. Anterior communicating artery is present. A1 and M1 branches are patent. But otherwise MCA and ACA branch detail is limited. Venous sinuses: Patent. Anatomic variants: Dominant left ACA A1 segment. Review of the MIP images confirms the above findings IMPRESSION: 1. Stable CT appearance of the brain since yesterday. Left corona radiata ischemia has progressed since 2015, but is favored to be chronic. Brain MRI is pending at this time. 2. CTA Head and Neck limited due to venous dominant contrast timing. Negative for large vessel occlusion. At least moderate Right ICA siphon atherosclerotic stenosis is suspected. But all other vessel detail is limited. Electronically Signed   By: Odessa Fleming M.D.   On: 04/16/2022 07:37    PHYSICAL EXAM General:  alert, obese patient in no acute distress Respiratory:  Regular, unlabored respirations on room air  NEURO:  Mental Status: AA&Ox3  Speech/Language: speech is without dysarthria or aphasia.  Fluency, and comprehension intact.  Cranial Nerves:  II: PERRL. Visual fields full.  III, IV, VI: EOMI. Eyelids elevate symmetrically.  V: Sensation is intact to light touch and  symmetrical to face.  VII: Smile is symmetrical.  VIII: hearing intact to voice. IX, X:Phonation is normal.  XII: tongue is midline without fasciculations. Motor: 5/5 strength to all muscle groups tested.  Except mild weakness of left leg hip flexors.  Diminished fine finger movements on the left and orbits right over left upper extremity Tone: is normal and bulk is normal Sensation- Intact to light touch bilaterally.  Coordination: FTN intact bilaterally, HKS: no ataxia in BLE.  Subtle drift to left leg Gait- deferred   ASSESSMENT/PLAN [name] is a 51 y.o. male with history of DM2, obesity, HTN, HLD and OSA presenting with  slurred speech and left sided weakness and clumsiness upon waking up yesterday.  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  Patient reports that his HTN is not well controlled.  Stroke:  right pontine infarcts Etiology:  small vessel disease in setting of multiple uncontrolled risk factors  CT head No acute abnormality.  CTA head & neck limited due to venous dominant contrast timing MRI  two small right pontine infarcts Carotid Doppler  1-39% stenosis in bilateral carotids 2D Echo pending LDL 157 HgbA1c 7.7 VTE prophylaxis - lovenox    Diet   Diet heart healthy/carb modified Room service appropriate? Yes; Fluid consistency: Thin   No antithrombotic prior to admission, now on aspirin 81 mg daily and clopidogrel 75 mg daily for three weeks then ASA alone Therapy recommendations:  Outpatient PT/OT Disposition:  home  Hypertension Home meds:  amlodipine 10 mg daily, losartan 100 mg daily, spironolactone 25 mg daily Stable Permissive hypertension (OK if < 220/120) but gradually normalize in 5-7 days Long-term BP goal normotensive  Hyperlipidemia Home meds:  atorvastatin 40 mg daily, increased to 80 mg LDL 157, goal < 70 Continue statin at discharge  Diabetes type II Uncontrolled Home meds:  metformin 1000 mg BID HgbA1c 7.7, goal <  7.0 CBGs Recent Labs    04/15/22 2214 04/16/22 0908 04/16/22 1248  GLUCAP 190* 159* 197*  Recommend close PCP follow up for better DM control  SSI  Other Stroke Risk Factors Cigar smoker advised to stop smoking Obesity, Body mass index is 49.71 kg/m., BMI >/= 30 associated with increased stroke risk, recommend weight loss, diet and exercise as appropriate  Hx TIA Obstructive sleep apnea, on CPAP at home  Other Active Problems none  Hospital day # 0  Cortney E Ernestina Columbia , MSN, AGACNP-BC [facility] See Amion for schedule and pager information 04/16/2022 2:56 PM   STROKE MD NOTE :  I have personally obtained history,examined this patient, reviewed notes, independently viewed imaging studies, participated in medical decision making and plan of care.ROS completed by me personally and pertinent positives fully documented  I have made any additions or clarifications directly to the above note. Agree with note above.  He presented with slurred speech and left-sided weakness and clumsiness secondary to pontine infarct likely from small vessel disease.  Recommend check echocardiogram.  Start aspirin and Plavix for 3 weeks followed by aspirin alone and aggressive risk factor modification.  Statin for elevated lipids.  Mobilize out of bed.  Physical Occupational Therapy consults.  Long discussion with patient and answered questions.  Greater than 50% time during this 50-minute visit was spent in counseling and coordination of care about his lacunar stroke and leg weakness and discussion about stroke evaluation, prevention and treatment and discussion with care team.  Delia Heady, MD Medical Director [facility] Pager: [phone] 04/16/2022 2:59 PM   To contact Stroke Continuity provider, please refer to [email or web address]. After hours, contact General Neurology

## 2022-04-16 NOTE — Progress Notes (Signed)
Bilateral carotid duplex study completed. Please see CV Proc for preliminary results.  Jameel Quant BS, RVT 04/16/2022 10:39 AM

## 2022-04-16 NOTE — Progress Notes (Signed)
PT Cancellation Note  Patient Details Name: RAKWON LETOURNEAU MRN: [id number] DOB: 1971/04/14   Cancelled Treatment:    Reason Eval/Treat Not Completed: Patient at procedure or test/unavailable; patient having carotid duplex, will return as able.    Elray Mcgregor 04/16/2022, 10:17 AM Sheran Lawless, PT Acute Rehabilitation Services Office:[phone] 04/16/2022

## 2022-04-16 NOTE — Evaluation (Signed)
Physical Therapy Evaluation Patient Details Name: Travis Powell MRN: [id number] DOB: 1971-06-01 Today's Date: 04/16/2022  History of Present Illness  51 yo male presenting to ED on 8/31 for L hand weakness and speech changes. MRI showing Small acute right pontine infarcts. PMH including morbid obesity, hypertension, hyperlipidemia, diabetes, and OSA on CPAP.  Clinical Impression  Patient presents with decreased mobility due to deficits listed in PT problem list.  He was able to ambulate without and with assistive device and negotiated stairs appropriate for home access with minguard A.  Wife present and educated on level of assist at d/c.  They are agreeable to outpatient PT follow up.  No further acute level PT as likely for d/c per MD.      Recommendations for follow up therapy are one component of a multi-disciplinary discharge planning process, led by the attending physician.  Recommendations may be updated based on patient status, additional functional criteria and insurance authorization.  Follow Up Recommendations Outpatient PT      Assistance Recommended at Discharge Intermittent Supervision/Assistance  Patient can return home with the following  A little help with walking and/or transfers;Assistance with cooking/housework;Assist for transportation;A little help with bathing/dressing/bathroom    Equipment Recommendations None recommended by PT  Recommendations for Other Services       Functional Status Assessment Patient has had a recent decline in their functional status and demonstrates the ability to make significant improvements in function in a reasonable and predictable amount of time.     Precautions / Restrictions Precautions Precautions: Fall Precaution Comments: due to L LE weakness Restrictions Weight Bearing Restrictions: No      Mobility  Bed Mobility Overal bed mobility: Modified Independent             General bed mobility comments: increased time  struggled a bit coming upright    Transfers Overall transfer level: Modified independent                 General transfer comment: up unaided, but needing UE support    Ambulation/Gait Ambulation/Gait assistance: Min guard Gait Distance (Feet): 200 Feet Assistive device: None, Straight cane Gait Pattern/deviations: Step-through pattern, Knee flexed in stance - left, Decreased stride length       General Gait Details: some circumduction noted on L and mild instability L LE in stance, though no LOB; no device to stairs, then trial with SPC with cues for technique when walking back to room  Stairs Stairs: Yes Stairs assistance: Min guard Stair Management: One rail Right Number of Stairs: 10 General stair comments: forward with step through technique minguard for safety, descending with step to technique after knee buckled on L when lowering with it (educated in sequence: "up with good and down with weak leg" and to use if feeling weaker) and for wife assist initially.  Wheelchair Mobility    Modified Rankin (Stroke Patients Only) Modified Rankin (Stroke Patients Only) Pre-Morbid Rankin Score: No symptoms Modified Rankin: Moderately severe disability     Balance Overall balance assessment: Needs assistance   Sitting balance-Leahy Scale: Good       Standing balance-Leahy Scale: Fair Standing balance comment: performed parts of Berg with some deficits noted, discussed cane initially at home for balance/safety                 Standardized Balance Assessment Standardized Balance Assessment : Berg Balance Test Berg Balance Test Sit to Stand: Able to stand using hands after several tries Sitting with Back Unsupported  but Feet Supported on Floor or Stool: Able to sit safely and securely 2 minutes Stand to Sit: Controls descent by using hands Standing Unsupported with Eyes Closed: Able to stand 10 seconds safely Standing Ubsupported with Feet Together: Needs help  to attain position but able to stand for 30 seconds with feet together From Standing, Reach Forward with Outstretched Arm: Can reach forward >12 cm safely (5") From Standing Position, Pick up Object from Floor: Able to pick up shoe, needs supervision From Standing Position, Turn to Look Behind Over each Shoulder: Looks behind one side only/other side shows less weight shift Turn 360 Degrees: Able to turn 360 degrees safely but slowly Standing Unsupported, Alternately Place Feet on Step/Stool: Able to complete >2 steps/needs minimal assist         Pertinent Vitals/Pain Pain Assessment Pain Assessment: No/denies pain    Home Living Family/patient expects to be discharged to:: Private residence Living Arrangements: Spouse/significant other;Children Available Help at Discharge: Family;Available 24 hours/day Type of Home: House Home Access: Stairs to enter Entrance Stairs-Rails: None Entrance Stairs-Number of Steps: 1 Alternate Level Stairs-Number of Steps: Flight Home Layout: Two level;Bed/bath upstairs Home Equipment: Counsellor (2 wheels);Cane - single point      Prior Function Prior Level of Function : Independent/Modified Independent;Working/employed;Driving               ADLs Comments: Works as an Estate manager/land agent: Left    Extremity/Trunk Assessment   Upper Extremity Assessment Upper Extremity Assessment: Defer to OT evaluation LUE Deficits / Details: WFL strength. decreased coordination as seen in during digit opposition, finger-to-nose, and rapid hand movement LUE Coordination: decreased fine motor;decreased gross motor    Lower Extremity Assessment Lower Extremity Assessment: LLE deficits/detail LLE Deficits / Details: AROM WFL, strength 4+/5 slightly weaker than R LLE Sensation: WNL    Cervical / Trunk Assessment Cervical / Trunk Assessment: Other exceptions Cervical / Trunk Exceptions: increased body  habitus  Communication   Communication: No difficulties  Cognition Arousal/Alertness: Awake/alert Behavior During Therapy: WFL for tasks assessed/performed Overall Cognitive Status: Within Functional Limits for tasks assessed                                          General Comments General comments (skin integrity, edema, etc.): Wife in room and educated on helping with steps and shower initially and reviewed fall prevention tips for home and what to do if he does fall.    Exercises     Assessment/Plan    PT Assessment All further PT needs can be met in the next venue of care  PT Problem List Decreased mobility;Decreased balance;Decreased strength;Decreased knowledge of use of DME       PT Treatment Interventions      PT Goals (Current goals can be found in the Care Plan section)  Acute Rehab PT Goals Patient Stated Goal: to return home, back to independent PT Goal Formulation: All assessment and education complete, DC therapy    Frequency       Co-evaluation               AM-PAC PT "6 Clicks" Mobility  Outcome Measure Help needed turning from your back to your side while in a flat bed without using bedrails?: A Little Help needed moving from lying on your back to sitting on the side  of a flat bed without using bedrails?: A Little Help needed moving to and from a bed to a chair (including a wheelchair)?: A Little Help needed standing up from a chair using your arms (e.g., wheelchair or bedside chair)?: A Little Help needed to walk in hospital room?: A Little Help needed climbing 3-5 steps with a railing? : A Little 6 Click Score: 18    End of Session Equipment Utilized During Treatment: Gait belt Activity Tolerance: Patient tolerated treatment well Patient left: in bed;with call bell/phone within reach;with family/visitor present   PT Visit Diagnosis: Other abnormalities of gait and mobility (R26.89);Other symptoms and signs involving the  nervous system (R29.898);Hemiplegia and hemiparesis Hemiplegia - Right/Left: Left Hemiplegia - dominant/non-dominant: Dominant Hemiplegia - caused by: Cerebral infarction    Time: 1405-1430 PT Time Calculation (min) (ACUTE ONLY): 25 min   Charges:   PT Evaluation $PT Eval Moderate Complexity: 1 Mod PT Treatments $Gait Training: 8-22 mins        Magda Kiel, PT Acute Rehabilitation Services Office:[phone] 04/16/2022   Reginia Naas 04/16/2022, 2:34 PM

## 2022-04-16 NOTE — Discharge Instructions (Addendum)
Dear Travis Powell,   Thank you for trusting [place] with your care. We treated you in the hospital for a stroke. It will be important to take Aspirin 81mg  once daily and Plavix 75mg  once daily for three weeks. After three weeks, just take the aspirin 81mg  alone. Please also continue to take your cholesterol medicine, blood pressure medicine, and blood sugar medicine. We have increased your statin to 80mg  daily. Please take your losartan and amlodipine as you have. Please take 2 metformin tablets (1000mg ) twice daily for a total of 2000mg  daily. Please follow up with your primary care physician in 1-2 weeks. Please also follow up with the physical therapist, occupational therapist, and speech pathologist as an outpatient.

## 2022-04-16 NOTE — Discharge Summary (Addendum)
Name: Travis Powell MRN: [id number] DOB: [birth year] 51 y.o. PCP: [name], MD  Date of Admission: 04/15/2022 12:05 PM Date of Discharge: 04/16/22 Attending Physician: Dr. Daryll Drown  Discharge Diagnosis: Principal Problem:   Pontine infarct Active Problems:   Type 2 diabetes mellitus with hyperglycemia (HCC)   HTN (hypertension), benign   OSA (obstructive sleep apnea)   Hyperlipidemia    Discharge Medications: Allergies as of 04/16/2022   No Known Allergies      Medication List     TAKE these medications    amLODipine 10 MG tablet Commonly known as: NORVASC Take 1 tablet (10 mg total) by mouth every morning.   aspirin EC 81 MG tablet Take 1 tablet (81 mg total) by mouth daily. Swallow whole. Start taking on: April 17, 2022   atorvastatin 80 MG tablet Commonly known as: LIPITOR Take 1 tablet (80 mg total) by mouth daily. What changed:  medication strength how much to take   clopidogrel 75 MG tablet Commonly known as: PLAVIX Take 1 tablet (75 mg total) by mouth daily for 21 days. Start taking on: April 17, 2022   losartan 100 MG tablet Commonly known as: COZAAR Take 1 tablet (100 mg total) by mouth daily.   metFORMIN 500 MG tablet Commonly known as: GLUCOPHAGE Take 2 tablets (1,000 mg total) by mouth 2 (two) times daily with a meal.   spironolactone 25 MG tablet Commonly known as: Aldactone Take 1 tablet (25 mg total) by mouth daily.        Disposition and follow-up:   [name] was discharged from [facility] in Stable condition.  At the hospital follow up visit please address:  1.  Follow-up:  a. Pontine infarction - right pontine infarcts. LDL elevated. Concern for BP medication non-adherence. PT/OT/SLP recommended outpatient follow up.    b. HTN - discharged on home amlodipine, losartan, spiro   c. HLD - LDL elevated on 40mg  atorvastatin. Statin increased to 80mg .   d. DM - not taking metformin PTA.  A1c 7.7. Restarted  2.  Labs / imaging needed at time of follow-up: CBC, BMP  3.  Pending labs/ test needing follow-up: none  4.  Medication Changes  Started: Plavix  Stopped: none  Changed: statin increased to 80mg   Follow-up Appointments:  Follow-up Information     [place] [facility] up.   Specialty: Rehabilitation Why: outpt Speech (SLP) referral made -they will contact you to schedule or you may reach out to them if you have not been contacted within 7 days Contact information: [address] [place] [id number] [phone]        [name], MD. Schedule an appointment as soon as possible for a visit in 1 week(s).   Specialty: Internal Medicine Contact information: [place] [address] [place] Hospital Course by problem list:   Cerebral Infarction:  Patient presented with complaints of slurring of speech, left hand weakness and left leg weakness. He was admitted for stroke work up. Patient noted to be hypertensive on admission. His BP medications were held to allow for permissive HTN. Lipid panel was ordered, LDL returned elevated. His statin was increased to 80mg . A1c also noted to be slightly above goal. Patient had not been taking metformin as prescribed. He was advised to take as directed. ECHO, carotid US, CT head and neck were  non-revealing. His MRI brain did reveal 2X pontine infarcts. Patient reported his symptoms had largely resolved by the morning after admission. Neuro started the patient on ASA and plavix. Neuro exam was largely unremarkable. He was evaluated by PT/OT/SLP who recommended outpatient follow up. Discharged with instruction for PCP and PT/OT/SLP follow up. BP meds restarted on discharge.    Discharge Subjective: Patient feeling well. States his symptoms have resolved. He denies slurring of speech, no extremity  weakness, feels back to baseline. Eating. No complaints.   Discharge Exam:   BP (!) 154/96 (BP Location: Left Arm)   Pulse 75   Temp 99.4 F (37.4 C) (Oral)   Resp 20   Ht 5' 10.98" (1.803 m)   Wt (!) 161.6 kg   SpO2 99%   BMI 49.71 kg/m  General:   awake and alert, lying comfortably in bed, cooperative, not in acute distress Skin:   warm and dry, intact without any obvious lesions, no rashes Head:   normocephalic and atraumatic Eyes:   extraocular movements intact, conjunctivae pink, no periorbital swelling or scleral icterus Lungs:   normal respiratory effort, breathing unlabored, symmetrical chest rise, no crackles or wheezing Cardiac:   regular rate and rhythm, normal S1 and S2 Abdomen:   soft and non-distended, normoactive bowel sounds present in all four quadrants, no guarding or palpable masses Musculoskeletal:   full range of motion in joints, motor strength 5 /5 in grip, elbow flexion-extension, shoulder shrug, SCM, plantarflexion, dorsiflexion, knee extension, flexion bilaterally Neurologic:   oriented to person-place-time, EOM intact, no nystagmus, muscles of facial expression intact, tongue midline, no slurring of speech, shoulder shrug intact, grip strength, hip flex, plantar flexion 5/5. Strength grossly intact. No dysmetria or dysdiadochokinesia.  Psychiatric:   mood and affect normal, intelligible speech  Pertinent Labs, Studies, and Procedures:     Latest Ref Rng & Units 04/16/2022    5:17 AM 04/15/2022   12:21 PM 03/06/2021    8:43 PM  CBC  WBC 4.0 - 10.5 K/uL 8.4  9.4  10.2   Hemoglobin 13.0 - 17.0 g/dL 96.0  45.4  09.8   Hematocrit 39.0 - 52.0 % 39.8  38.5  46.1   Platelets 150 - 400 K/uL 260  269  284        Latest Ref Rng & Units 04/16/2022    5:17 AM 04/15/2022   12:21 PM 03/11/2022    9:33 AM  CMP  Glucose 70 - 99 mg/dL 119  147  829   BUN 6 - 20 mg/dL 9  10  16    Creatinine 0.61 - 1.24 mg/dL  5.62  1.30   Sodium 135 - 145 mmol/L 137  137  140    Potassium 3.5 - 5.1 mmol/L 3.3  3.4  3.6   Chloride 98 - 111 mmol/L 102  101  99   CO2 22 - 32 mmol/L 23  26  22    Calcium 8.9 - 10.3 mg/dL 8.5  9.1  9.2   Total Protein 6.5 - 8.1 g/dL  7.6  7.6   Total Bilirubin 0.3 - 1.2 mg/dL  0.6  0.4   Alkaline Phos 38 - 126 U/L  65  71   AST 15 - 41 U/L  19  19   ALT 0 - 44 U/L  17  15     ECHOCARDIOGRAM COMPLETE BUBBLE STUDY  Result Date: 04/16/2022    ECHOCARDIOGRAM REPORT   Patient Name:   Johnson A Ellis Date of  Exam: 04/16/2022 Medical Rec #:  [id number]      Height:       71.0 in Accession #:    [id number]     Weight:       356.3 lb Date of Birth:  February 05, 1971      BSA:          2.695 m Patient Age:    64 years       BP:           162/85 mmHg Patient Gender: M              HR:           72 bpm. Exam Location:  Inpatient Procedure: 2D Echo, Cardiac Doppler, Color Doppler and Saline Contrast Bubble            Study Indications:    Stroke  History:        Patient has no prior history of Echocardiogram examinations.                 Risk Factors:Hypertension, Diabetes and Sleep Apnea.  Sonographer:    Jefferey Pica Referring Phys: [id number] [place]  1. Left ventricular ejection fraction, by estimation, is 60 to 65%. The left ventricle has normal function. Left ventricular endocardial border not optimally defined to evaluate regional wall motion. There is moderate left ventricular hypertrophy. Left ventricular diastolic parameters are consistent with Grade I diastolic dysfunction (impaired relaxation).  2. Right ventricular systolic function is normal. The right ventricular size is normal. Tricuspid regurgitation signal is inadequate for assessing PA pressure.  3. The mitral valve is grossly normal. Trivial mitral valve regurgitation. No evidence of mitral stenosis.  4. The aortic valve is tricuspid. Aortic valve regurgitation is not visualized. No aortic stenosis is present. Conclusion(s)/Recommendation(s): No intracardiac source of embolism  detected on this transthoracic study. Consider a transesophageal echocardiogram to exclude cardiac source of embolism if clinically indicated. FINDINGS  Left Ventricle: Left ventricular ejection fraction, by estimation, is 60 to 65%. The left ventricle has normal function. Left ventricular endocardial border not optimally defined to evaluate regional wall motion. The left ventricular internal cavity size was normal in size. There is moderate left ventricular hypertrophy. Left ventricular diastolic parameters are consistent with Grade I diastolic dysfunction (impaired relaxation). Right Ventricle: The right ventricular size is normal. No increase in right ventricular wall thickness. Right ventricular systolic function is normal. Tricuspid regurgitation signal is inadequate for assessing PA pressure. Left Atrium: Left atrial size was normal in size. Right Atrium: Right atrial size was normal in size. Pericardium: There is no evidence of pericardial effusion. Presence of epicardial fat layer. Mitral Valve: The mitral valve is grossly normal. Trivial mitral valve regurgitation. No evidence of mitral valve stenosis. Tricuspid Valve: The tricuspid valve is normal in structure. Tricuspid valve regurgitation is trivial. No evidence of tricuspid stenosis. Aortic Valve: The aortic valve is tricuspid. Aortic valve regurgitation is not visualized. No aortic stenosis is present. Aortic valve peak gradient measures 6.6 mmHg. Pulmonic Valve: The pulmonic valve was normal in structure. Pulmonic valve regurgitation is trivial. No evidence of pulmonic stenosis. Aorta: The aortic root is normal in size and structure. Venous: The inferior vena cava was not well visualized. IAS/Shunts: No atrial level shunt detected by color flow Doppler. Agitated saline contrast was given intravenously to evaluate for intracardiac shunting.  LEFT VENTRICLE PLAX 2D LVIDd:         5.10 cm   Diastology LVIDs:  2.60 cm   LV e' medial:    5.93 cm/s  LV PW:         1.30 cm   LV E/e' medial:  10.3 LV IVS:        1.20 cm   LV e' lateral:   7.65 cm/s LVOT diam:     2.20 cm   LV E/e' lateral: 7.9 LV SV:         91 LV SV Index:   34 LVOT Area:     3.80 cm  RIGHT VENTRICLE RV S prime:     12.20 cm/s TAPSE (M-mode): 1.8 cm LEFT ATRIUM             Index        RIGHT ATRIUM           Index LA diam:        3.50 cm 1.30 cm/m   RA Area:     13.50 cm LA Vol (A2C):   72.8 ml 27.01 ml/m  RA Volume:   33.10 ml  12.28 ml/m LA Vol (A4C):   45.4 ml 16.85 ml/m LA Biplane Vol: 59.1 ml 21.93 ml/m  AORTIC VALVE                 PULMONIC VALVE AV Area (Vmax): 3.34 cm     PV Vmax:       1.03 m/s AV Vmax:        128.50 cm/s  PV Peak grad:  4.2 mmHg AV Peak Grad:   6.6 mmHg LVOT Vmax:      113.00 cm/s LVOT Vmean:     72.500 cm/s LVOT VTI:       0.239 m  AORTA Ao Root diam: 3.50 cm Ao Asc diam:  3.50 cm MITRAL VALVE MV Area (PHT): 5.66 cm    SHUNTS MV Decel Time: 134 msec    Systemic VTI:  0.24 m MV E velocity: 60.80 cm/s  Systemic Diam: 2.20 cm MV A velocity: 86.50 cm/s MV E/A ratio:  0.70 Weston Brass MD Electronically signed by Weston Brass MD Signature Date/Time: 04/16/2022/3:33:01 PM    Final    VAS US CAROTID  Result Date: 04/16/2022 Carotid Arterial Duplex Study Patient Name:  Alexia Freestone  Date of Exam:   04/16/2022 Medical Rec #: [id number]       Accession #:    [id number] Date of Birth: 1971/06/04       Patient Gender: M Patient Age:   81 years Exam Location:  [facility] Procedure:      VAS US CAROTID Referring Phys: Dewitt Hoes DE LA TORRE --------------------------------------------------------------------------------  Indications:  CVA. Risk Factors: Hypertension, hyperlipidemia, Diabetes, current smoker, prior               CVA. Limitations   Today's exam was limited due to the body habitus of the patient. Performing Technologist: Magdalene River  Examination Guidelines: A complete evaluation includes B-mode imaging, spectral Doppler, color Doppler, and power  Doppler as needed of all accessible portions of each vessel. Bilateral testing is considered an integral part of a complete examination. Limited examinations for reoccurring indications may be performed as noted.  Right Carotid Findings: +----------+--------+--------+--------+------------------+--------+           PSV cm/sEDV cm/sStenosisPlaque DescriptionComments +----------+--------+--------+--------+------------------+--------+ CCA Prox  88      11                                         +----------+--------+--------+--------+------------------+--------+  CCA Distal78      12              calcific                   +----------+--------+--------+--------+------------------+--------+ ICA Prox  126     14              heterogenous               +----------+--------+--------+--------+------------------+--------+ ICA Distal117     34                                         +----------+--------+--------+--------+------------------+--------+ ECA       55      13              heterogenous               +----------+--------+--------+--------+------------------+--------+ +----------+--------+-------+----------------+-------------------+           PSV cm/sEDV cmsDescribe        Arm Pressure (mmHG) +----------+--------+-------+----------------+-------------------+ [id number]            Multiphasic, WNL                    +----------+--------+-------+----------------+-------------------+ +---------+--------+--+--------+--+---------+ VertebralPSV cm/s36EDV cm/s12Antegrade +---------+--------+--+--------+--+---------+  Left Carotid Findings: +----------+--------+--------+--------+------------------+------------------+           PSV cm/sEDV cm/sStenosisPlaque DescriptionComments           +----------+--------+--------+--------+------------------+------------------+ CCA Prox  121     16                                                    +----------+--------+--------+--------+------------------+------------------+ CCA Distal51      12                                intimal thickening +----------+--------+--------+--------+------------------+------------------+ ICA Prox  43      17              heterogenous                         +----------+--------+--------+--------+------------------+------------------+ ICA Distal119     49                                                   +----------+--------+--------+--------+------------------+------------------+ ECA       90      12              heterogenous                         +----------+--------+--------+--------+------------------+------------------+ +----------+--------+--------+----------------+-------------------+           PSV cm/sEDV cm/sDescribe        Arm Pressure (mmHG) +----------+--------+--------+----------------+-------------------+ Subclavian170             Multiphasic, WNL                    +----------+--------+--------+----------------+-------------------+ +---------+--------+--+--------+--+---------+ VertebralPSV cm/s36EDV cm/s11Antegrade +---------+--------+--+--------+--+---------+   Summary: Right Carotid: Velocities in the  right ICA are consistent with a 1-39% stenosis. Left Carotid: Velocities in the left ICA are consistent with a 1-39% stenosis. Vertebrals:  Bilateral vertebral arteries demonstrate antegrade flow. Subclavians: Normal flow hemodynamics were seen in bilateral subclavian              arteries. *See table(s) above for measurements and observations.  Electronically signed by Antony Contras MD on 04/16/2022 at 2:41:18 PM.    Final    MR BRAIN WO CONTRAST  Result Date: 04/16/2022 CLINICAL DATA:  Left leg weakness EXAM: MRI HEAD WITHOUT CONTRAST TECHNIQUE: Multiplanar, multiecho pulse sequences of the brain and surrounding structures were obtained without intravenous contrast. COMPARISON:  Noncontrast CT head 1 day prior,  same day CTA head and neck FINDINGS: Brain: There are small foci of diffusion restriction in the right pons with associated FLAIR signal abnormality consistent with acute to early subacute infarct. There is no hemorrhage or mass effect. There is no other evidence of acute infarct. There is no acute intracranial hemorrhage or extra-axial fluid collection. Ventricles are stable in size. Gray-white differentiation is preserved. Remote infarcts are seen in the left frontal lobe periventricular white matter and corona radiata. There are additional small foci of FLAIR signal abnormality in the supratentorial brain likely reflecting underlying chronic small vessel ischemic change. There is a single punctate chronic microhemorrhage in the posterior limb of the left internal capsule, nonspecific. There is no mass lesion.  There is no mass effect or midline shift. Vascular: Normal flow voids. Skull and upper cervical spine: Normal marrow signal. Sinuses/Orbits: The paranasal sinuses are clear. The globes and orbits are unremarkable Other: None. IMPRESSION: 1. Small acute right pontine infarcts without hemorrhage or mass effect. 2. Small remote frontal lobe infarcts. Electronically Signed   By: Valetta Mole M.D.   On: 04/16/2022 12:51   CT ANGIO HEAD NECK W WO CM  Result Date: 04/16/2022 CLINICAL DATA:  51 year old male with neurologic deficit, left arm weakness. EXAM: CT ANGIOGRAPHY HEAD AND NECK TECHNIQUE: Multidetector CT imaging of the head and neck was performed using the standard protocol during bolus administration of intravenous contrast. Multiplanar CT image reconstructions and MIPs were obtained to evaluate the vascular anatomy. Carotid stenosis measurements (when applicable) are obtained utilizing NASCET criteria, using the distal internal carotid diameter as the denominator. RADIATION DOSE REDUCTION: This exam was performed according to the departmental dose-optimization program which includes automated exposure  control, adjustment of the mA and/or kV according to patient size and/or use of iterative reconstruction technique. CONTRAST:  191mL OMNIPAQUE IOHEXOL 350 MG/ML SOLN COMPARISON:  Head CT 04/15/2022.  Brain MRI 01/28/2014. FINDINGS: CT HEAD Brain: Chronic appearing left corona radiata lacunar infarct is new since 2015 but appears stable from the CT yesterday (series 7, image 19). Small cystic areas of left frontal horn region white matter encephalomalacia are stable since 2015. Stable gray-white matter differentiation throughout the brain. No midline shift, ventriculomegaly, mass effect, evidence of mass lesion, intracranial hemorrhage or evidence of cortically based acute infarction. Calvarium and skull base: No acute osseous abnormality identified. Paranasal sinuses: Visualized paranasal sinuses and mastoids are stable and well aerated. Orbits: No acute orbit or scalp soft tissue finding. CTA NECK Skeleton: Impacted right mandible wisdom tooth. Cervical spine disc and endplate degeneration. No acute osseous abnormality identified. Upper chest: Negative. Other neck: Retropharyngeal course of both carotids. No discrete neck mass or lymphadenopathy. Aortic arch: Suboptimal arterial contrast timing. Three vessel arch configuration. No significant arch atherosclerosis. Right carotid system: Mildly tortuous brachiocephalic artery  and proximal right CCA. Retropharyngeal course of the right carotid. Calcified right ICA origin. Less than 50 % stenosis with respect to the distal vessel. Left carotid system: Retropharyngeal left carotid and bifurcation. Minimal calcified plaque with no stenosis apparent. Vertebral arteries: Limited evaluation due to venous dominant contrast timing. Both cervical vertebral arteries appear patent to the skull base. CTA HEAD Venous dominant intracranial contrast timing. Posterior circulation: Patent distal vertebral arteries with calcified plaque more apparent on the left. Patent vertebrobasilar  junction and basilar artery. SCA and PCA origins appear patent. Bilateral PCA branches appear symmetric. Anterior circulation: Both ICA siphons are patent with calcified atherosclerosis. Limited siphon detail, no high-grade stenosis suspected on the left. But there is at least moderate right ICA supraclinoid stenosis. Patent carotid termini, MCA and ACA origins. Dominant left ACA A1. Anterior communicating artery is present. A1 and M1 branches are patent. But otherwise MCA and ACA branch detail is limited. Venous sinuses: Patent. Anatomic variants: Dominant left ACA A1 segment. Review of the MIP images confirms the above findings IMPRESSION: 1. Stable CT appearance of the brain since yesterday. Left corona radiata ischemia has progressed since 2015, but is favored to be chronic. Brain MRI is pending at this time. 2. CTA Head and Neck limited due to venous dominant contrast timing. Negative for large vessel occlusion. At least moderate Right ICA siphon atherosclerotic stenosis is suspected. But all other vessel detail is limited. Electronically Signed   By: Genevie Ann M.D.   On: 04/16/2022 07:37   CT HEAD WO CONTRAST  Result Date: 04/15/2022 CLINICAL DATA:  Left arm weakness. EXAM: CT HEAD WITHOUT CONTRAST TECHNIQUE: Contiguous axial images were obtained from the base of the skull through the vertex without intravenous contrast. RADIATION DOSE REDUCTION: This exam was performed according to the departmental dose-optimization program which includes automated exposure control, adjustment of the mA and/or kV according to patient size and/or use of iterative reconstruction technique. COMPARISON:  05/18/2012 FINDINGS: Brain: There is no evidence for acute hemorrhage, hydrocephalus, mass lesion, or abnormal extra-axial fluid collection. No definite CT evidence for acute infarction. Vascular: No hyperdense vessel or unexpected calcification. Skull: No evidence for fracture. No worrisome lytic or sclerotic lesion.  Sinuses/Orbits: The visualized paranasal sinuses and mastoid air cells are clear. Visualized portions of the globes and intraorbital fat are unremarkable. Other: None. IMPRESSION: Stable.  No acute intracranial abnormality. Electronically Signed   By: Misty Stanley M.D.   On: 04/15/2022 12:44     Discharge Instructions: Discharge Instructions     Call MD for:  difficulty breathing, headache or visual disturbances   Complete by: As directed    Call MD for:  extreme fatigue   Complete by: As directed    Call MD for:  hives   Complete by: As directed    Call MD for:  persistant dizziness or light-headedness   Complete by: As directed    Call MD for:  persistant nausea and vomiting   Complete by: As directed    Call MD for:  redness, tenderness, or signs of infection (pain, swelling, redness, odor or green/yellow discharge around incision site)   Complete by: As directed    Call MD for:  severe uncontrolled pain   Complete by: As directed    Call MD for:  temperature >100.4   Complete by: As directed    Diet - low sodium heart healthy   Complete by: As directed    Increase activity slowly   Complete by: As directed  Thank you for trusting [place] with your care. We treated you in the hospital for a stroke. It will be important to take Aspirin 81mg  once daily and Plavix 75mg  once daily for three weeks. After three weeks, just take the aspirin 81mg  alone. Please also continue to take your cholesterol medicine, blood pressure medicine, and blood sugar medicine. We have increased your statin to 80mg  daily. Please take your losartan and amlodipine as you have. Please take 2 metformin tablets (1000mg ) twice daily for a total of 2000mg  daily. Please follow up with your primary care physician in 1-2 weeks. Please also follow up with the physical therapist, occupational therapist, and speech pathologist as an outpatient.   Signed: Delene Ruffini, MD 04/16/2022, 5:34 PM

## 2022-04-16 NOTE — Plan of Care (Signed)
  Problem: Education: Goal: Knowledge of General Education information will improve Description: Including pain rating scale, medication(s)/side effects and non-pharmacologic comfort measures Outcome: Progressing   Problem: Clinical Measurements: Goal: Ability to maintain clinical measurements within normal limits will improve Outcome: Progressing Goal: Will remain free from infection Outcome: Progressing   

## 2022-04-16 NOTE — Evaluation (Signed)
Occupational Therapy Evaluation Patient Details Name: Travis Powell MRN: [id number] DOB: 1971/01/23 Today's Date: 04/16/2022   History of Present Illness 51 yo male presenting to ED on 8/31 for L hand weakness and speech changes. MRI showing Small acute right pontine infarcts. PMH including morbid obesity, hypertension, hyperlipidemia, diabetes, and OSA on CPAP.   Clinical Impression   PTA, pt was living with his wife and daughter and was independent. Pt currently presenting at Mod I - Independent level. Pt presenting with decreased LUE coordination. Recommend dc to home with follow up at OP OT for facilitating return to PLOF. Will continue to follow acutely to establish HEP for LUE.      Recommendations for follow up therapy are one component of a multi-disciplinary discharge planning process, led by the attending physician.  Recommendations may be updated based on patient status, additional functional criteria and insurance authorization.   Follow Up Recommendations  Outpatient OT    Assistance Recommended at Discharge Intermittent Supervision/Assistance  Patient can return home with the following      Functional Status Assessment  Patient has had a recent decline in their functional status and demonstrates the ability to make significant improvements in function in a reasonable and predictable amount of time.  Equipment Recommendations  None recommended by OT    Recommendations for Other Services       Precautions / Restrictions Precautions Precautions: None Restrictions Weight Bearing Restrictions: No      Mobility Bed Mobility Overal bed mobility: Independent                  Transfers Overall transfer level: Independent                        Balance Overall balance assessment: Mild deficits observed, not formally tested                                         ADL either performed or assessed with clinical judgement   ADL  Overall ADL's : Modified independent                                       General ADL Comments: Performing ADLs with increased time and moving cautiously. Able to complete toileting, hand hygiene, oral care, tub transfer, and functional mobility in hallway. Also presenting with urinary incontence during session.     Vision         Perception     Praxis      Pertinent Vitals/Pain Pain Assessment Pain Assessment: No/denies pain     Hand Dominance Left   Extremity/Trunk Assessment Upper Extremity Assessment Upper Extremity Assessment: LUE deficits/detail LUE Deficits / Details: WFL strength. decreased coordination as seen in during digit opposition, finger-to-nose, and rapid hand movement LUE Coordination: decreased fine motor;decreased gross motor   Lower Extremity Assessment Lower Extremity Assessment: Defer to PT evaluation   Cervical / Trunk Assessment Cervical / Trunk Assessment: Other exceptions Cervical / Trunk Exceptions: increased body habitus   Communication Communication Communication: No difficulties   Cognition Arousal/Alertness: Awake/alert Behavior During Therapy: WFL for tasks assessed/performed Overall Cognitive Status: Within Functional Limits for tasks assessed  General Comments: Naming three animals that start with "C", answering simple money mnagement question, and perform trail making task.     General Comments  Wife arriving at end of session    Exercises     Shoulder Instructions      Home Living Family/patient expects to be discharged to:: Private residence Living Arrangements: Spouse/significant other;Children Available Help at Discharge: Family;Available 24 hours/day (Wife works from home) Type of Home: House Home Access: Stairs to enter Secretary/administrator of Steps: 1   Home Layout: Two level;Bed/bath upstairs Alternate Level Stairs-Number of Steps: Flight Alternate  Level Stairs-Rails: Right Bathroom Shower/Tub: Producer, television/film/video: Handicapped height     Home Equipment: Crutches          Prior Functioning/Environment Prior Level of Function : Independent/Modified Independent;Working/employed;Driving               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 Problem List: Decreased strength;Decreased range of motion;Decreased activity tolerance;Impaired balance (sitting and/or standing);Decreased knowledge of use of DME or AE;Decreased knowledge of precautions;Decreased coordination;Impaired UE functional use      OT Treatment/Interventions: Self-care/ADL training;Therapeutic exercise;Energy conservation;DME and/or AE instruction;Therapeutic activities;Patient/family education    OT Goals(Current goals can be found in the care plan section) Acute Rehab OT Goals Patient Stated Goal: Go home OT Goal Formulation: With patient/family Time For Goal Achievement: 04/30/22 Potential to Achieve Goals: Good  OT Frequency: Min 3X/week    Co-evaluation              AM-PAC OT "6 Clicks" Daily Activity     Outcome Measure Help from another person eating meals?: None Help from another person taking care of personal grooming?: None Help from another person toileting, which includes using toliet, bedpan, or urinal?: None Help from another person bathing (including washing, rinsing, drying)?: None Help from another person to put on and taking off regular upper body clothing?: None Help from another person to put on and taking off regular lower body clothing?: None 6 Click Score: 24   End of Session Nurse Communication: Mobility status  Activity Tolerance: Patient tolerated treatment well Patient left: in bed;with call bell/phone within reach;with family/visitor present (Sitting at [facility] to eat lunch)  OT Visit Diagnosis: Unsteadiness on feet (R26.81);Other abnormalities of gait and mobility (R26.89);Muscle weakness  (generalized) (M62.81)                Time: 9476-5465 OT Time Calculation (min): 22 min Charges:  OT General Charges $OT Visit: 1 Visit OT Evaluation $OT Eval Moderate Complexity: 1 Mod  Luana Tatro MSOT, OTR/L Acute Rehab Office: [phone]  Theodoro Grist Mandell Pangborn 04/16/2022, 1:38 PM

## 2022-04-16 NOTE — Progress Notes (Signed)
Occupational Therapy Treatment Patient Details Name: Travis Powell MRN: [id number] DOB: July 14, 1971 Today's Date: 04/16/2022   History of present illness 51 yo male presenting to ED on 8/31 for L hand weakness and speech changes. MRI showing Small acute right pontine infarcts. PMH including morbid obesity, hypertension, hyperlipidemia, diabetes, and OSA on CPAP.   OT comments  Arriving for second visit to provide HEP for LUE coordination. Pt sleepy but wife present and very supportive. Reviewing HEP handout in full with wife and discussing benefits of ROM exercises and FM coordination activities. Continue to recommend dc to OP OT and will continue to follow acutely as admitted.   Recommendations for follow up therapy are one component of a multi-disciplinary discharge planning process, led by the attending physician.  Recommendations may be updated based on patient status, additional functional criteria and insurance authorization.    Follow Up Recommendations  Outpatient OT    Assistance Recommended at Discharge Intermittent Supervision/Assistance  Patient can return home with the following      Equipment Recommendations  None recommended by OT    Recommendations for Other Services      Precautions / Restrictions Precautions Precautions: Fall Precaution Comments: due to L LE weakness Restrictions Weight Bearing Restrictions: No       Mobility Bed Mobility Overal bed mobility: Independent                  Transfers Overall transfer level: Independent                       Balance Overall balance assessment: Mild deficits observed, not formally tested                                         ADL either performed or assessed with clinical judgement   ADL Overall ADL's : Modified independent                                       General ADL Comments: Providing education on HEP for coordination of LUE    Extremity/Trunk  Assessment Upper Extremity Assessment Upper Extremity Assessment: Defer to OT evaluation LUE Deficits / Details: WFL strength. decreased coordination as seen in during digit opposition, finger-to-nose, and rapid hand movement LUE Coordination: decreased fine motor;decreased gross motor   Lower Extremity Assessment Lower Extremity Assessment: LLE deficits/detail LLE Deficits / Details: AROM WFL, strength 4+/5 slightly weaker than R LLE Sensation: WNL   Cervical / Trunk Assessment Cervical / Trunk Assessment: Other exceptions Cervical / Trunk Exceptions: increased body habitus    Vision       Perception     Praxis      Cognition Arousal/Alertness: Lethargic Behavior During Therapy: Flat affect Overall Cognitive Status: Difficult to assess                                 General Comments: Pt very sleepy upon arrival. Falling asleep. Wife present to recieve education on HEP        Exercises Exercises: Other exercises Other Exercises Other Exercises: Providing HEP with ROM exercises and FM coordination activities. Pt sleepy but very verbalizing understanding.    Shoulder Instructions       General Comments Wife present  and very supportive    Pertinent Vitals/ Pain       Pain Assessment Pain Assessment: No/denies pain  Home Living Family/patient expects to be discharged to:: Private residence Living Arrangements: Spouse/significant other;Children Available Help at Discharge: Family;Available 24 hours/day Type of Home: House Home Access: Stairs to enter [employer] of Steps: 1 Entrance Stairs-Rails: None Home Layout: Two level;Bed/bath upstairs Alternate Level Stairs-Number of Steps: Flight Alternate Level Stairs-Rails: Right Bathroom Shower/Tub: Producer, television/film/video: Handicapped height     Home Equipment: Engineer, agricultural (2 wheels);Cane - single point          Prior Functioning/Environment               Frequency  Min 3X/week        Progress Toward Goals  OT Goals(current goals can now be found in the care plan section)  Progress towards OT goals: Progressing toward goals  Acute Rehab OT Goals Patient Stated Goal: Go home OT Goal Formulation: With patient/family Time For Goal Achievement: 04/30/22 Potential to Achieve Goals: Good ADL Goals Pt/caregiver will Perform Home Exercise Program: Independently;With written HEP provided  Plan Discharge plan remains appropriate    Co-evaluation                 AM-PAC OT "6 Clicks" Daily Activity     Outcome Measure   Help from another person eating meals?: None Help from another person taking care of personal grooming?: None Help from another person toileting, which includes using toliet, bedpan, or urinal?: None Help from another person bathing (including washing, rinsing, drying)?: None Help from another person to put on and taking off regular upper body clothing?: None Help from another person to put on and taking off regular lower body clothing?: None 6 Click Score: 24    End of Session    OT Visit Diagnosis: Unsteadiness on feet (R26.81);Other abnormalities of gait and mobility (R26.89);Muscle weakness (generalized) (M62.81)   Activity Tolerance Patient tolerated treatment well   Patient Left in bed;with call bell/phone within reach;with family/visitor present   Nurse Communication Mobility status        Time: 5366-4403 OT Time Calculation (min): 11 min  Charges: OT General Charges $OT Visit: 1 Visit OT Treatments $Therapeutic Exercise: 8-22 mins  Jibri Schriefer MSOT, OTR/L Acute Rehab Office: [phone]  Theodoro Grist Johnetta Sloniker 04/16/2022, 3:01 PM

## 2022-04-16 NOTE — Evaluation (Signed)
Speech Language Pathology Evaluation Patient Details Name: Travis Powell MRN: [id number] DOB: 1971-02-13 Today's Date: 04/16/2022 Time: 7416-3845 SLP Time Calculation (min) (ACUTE ONLY): 15 min  Problem List:  Patient Active Problem List   Diagnosis Date Noted   TIA (transient ischemic attack) 04/15/2022   Morbid obesity (HCC) 02/06/2019   OSA (obstructive sleep apnea) 06/18/2014   Hyperlipidemia 06/18/2014   Abdominal lymphadenopathy 06/21/2012   Type 2 diabetes mellitus with hyperglycemia (HCC) 06/21/2012   HTN (hypertension), benign 06/21/2012   Past Medical History:  Past Medical History:  Diagnosis Date   Abdominal lymphadenopathy 06/21/2012   Borderline abdominal/pelvic adenopathy seen incidentally on CT scan 2011.  No change follow up 10/13   DM2 (diabetes mellitus, type 2) (HCC) 06/21/2012   HTN (hypertension), benign 06/21/2012   Left lumbar radiculopathy 06/21/2012   Leukocytosis 06/21/2012   High normal @ 10,000  41 poly, 46% lymphs stable over time 6// to 10/13 no anemia or thrombocytopenia   Migraine    Obesity    Sleep apnea    uses CPAP at night   Past Surgical History:  Past Surgical History:  Procedure Laterality Date   CARPAL TUNNEL RELEASE     HPI:  Pt is a 51 yo male who presented to the ED on 8/31 with L hand weakness and speech changes. MRI brain 9/1: Small acute right pontine infarcts. PMH: morbid obesity, hypertension, hyperlipidemia, diabetes, and OSA on CPAP.   Assessment / Plan / Recommendation Clinical Impression  Pt participated in speech-language-cognition evaluation with his wife present. Pt reported that he has a high-school education and is employed full-time as an Engineer, materials. Pt denied any baseline deficits in speech, language, or cognition. Pt reported that his speech is not as clear as at baseline and he indicated that he believes it is now approximately 80% back to baseline. Pt presented with mild dysarthria characterized by  reduced articulatory precision and reduced vocal intensity which negatively impacted speech intelligibility at the conversational level. The [facility] Mental Status Examination was completed to evaluate the pt's cognitive-linguistic skills. He achieved a score of 23/30 which is below the normal limits of 27 or more out of 30 and is suggestive of a mild impairment. He exhibited difficulty in the areas of sustained attention and memory. It is noteworthy that the pt received Ativan prior the evaluation and exhibited difficulty maintaining alertness once stimuli were withdrawn; the impact of this on his performance is strongly considered. Skilled SLP services are clinically indicated at this time to improve motor speech function and for re-assessment of cognitive-linguistic skills once alertness is optimal.    SLP Assessment  SLP Recommendation/Assessment: Patient needs continued Speech Lanaguage Pathology Services SLP Visit Diagnosis: Dysarthria and anarthria (R47.1);Cognitive communication deficit (R41.841)    Recommendations for follow up therapy are one component of a multi-disciplinary discharge planning process, led by the attending physician.  Recommendations may be updated based on patient status, additional functional criteria and insurance authorization.    Follow Up Recommendations  Outpatient SLP    Assistance Recommended at Discharge     Functional Status Assessment Patient has had a recent decline in their functional status and demonstrates the ability to make significant improvements in function in a reasonable and predictable amount of time.  Frequency and Duration min 2x/week  2 weeks      SLP Evaluation Cognition  Overall Cognitive Status: Impaired/Different from baseline Arousal/Alertness: Awake/alert Orientation Level: Oriented X4 Year: 2023 Month: September Day of Week: Correct Attention: Focused;Sustained  Focused Attention: Appears intact Sustained  Attention: Impaired Sustained Attention Impairment: Verbal complex Memory: Impaired Memory Impairment: Decreased recall of new information (Immediate: 5/5; delayed: 5/5; paragraph: 4/8) Awareness: Appears intact Problem Solving: Appears intact (Money: 3/3; time: 1/1) Executive Function: Sequencing;Organizing Sequencing: Appears intact (clock: 4/4) Organizing: Impaired Organizing Impairment: Verbal complex (backward digit span: 1/2)       Comprehension  Auditory Comprehension Overall Auditory Comprehension: Appears within functional limits for tasks assessed Yes/No Questions: Within Functional Limits Commands: Within Functional Limits Conversation: Complex    Expression Verbal Expression Overall Verbal Expression: Appears within functional limits for tasks assessed Initiation: No impairment Level of Generative/Spontaneous Verbalization: Conversation Repetition: No impairment Naming: No impairment Pragmatics: No impairment Written Expression Dominant Hand: Left   Oral / Motor  Motor Speech Overall Motor Speech: Impaired Respiration: Within functional limits Phonation: Low vocal intensity Resonance: Within functional limits Articulation: Impaired Level of Impairment: Conversation Intelligibility: Intelligibility reduced Word: 75-100% accurate Phrase: 75-100% accurate Sentence: 75-100% accurate Conversation: 75-100% accurate Motor Planning: Witnin functional limits Motor Speech Errors: Aware;Consistent           Travis Powell I. Vear Clock, MS, CCC-SLP Acute Rehabilitation Services Office number [phone]  Scheryl Marten 04/16/2022, 3:21 PM

## 2022-04-20 ENCOUNTER — Telehealth: Payer: Self-pay

## 2022-04-20 ENCOUNTER — Telehealth: Payer: Self-pay | Admitting: Internal Medicine

## 2022-04-20 NOTE — Patient Outreach (Signed)
  Care Coordination TOC Note Transition Care Management Follow-up Telephone Call Date of discharge and from where: [facility] 04/15/22-04/16/22 How have you been since you were released from the hospital? "I am doing good" Any questions or concerns? No  Items Reviewed: Did the pt receive and understand the discharge instructions provided? Yes  Medications obtained and verified? Yes  Other? No  Any new allergies since your discharge? No  Dietary orders reviewed? Yes Do you have support at home? Yes   Home Care and Equipment/Supplies: Were home health services ordered? no If so, what is the name of the agency? N/A  Has the agency set up a time to come to the patient's home? not applicable Were any new equipment or medical supplies ordered?  No What is the name of the medical supply agency? N/A Were you able to get the supplies/equipment? not applicable Do you have any questions related to the use of the equipment or supplies? No  Functional Questionnaire: (I = Independent and D = Dependent) ADLs: I  Bathing/Dressing- I  Meal Prep- I  Eating- I  Maintaining continence- I  Transferring/Ambulation- I  Managing Meds- I  Follow up appointments reviewed:  PCP Hospital f/u appt confirmed? Yes  Scheduled to see Dr. Benito Mccreedy on 04/27/22 @ 3:00. Specialist Hospital f/u appt confirmed? No   Are transportation arrangements needed? No  If their condition worsens, is the pt aware to call PCP or go to the Emergency Dept.? Yes Was the patient provided with contact information for the PCP's office or ED? Yes Was to pt encouraged to call back with questions or concerns? Yes  SDOH assessments and interventions completed:   Yes  Care Coordination Interventions Activated:  Yes   Care Coordination Interventions:   No further interventions needed at this time     Encounter Outcome:  Pt. Visit Completed

## 2022-04-20 NOTE — Telephone Encounter (Signed)
Open in error

## 2022-04-26 ENCOUNTER — Ambulatory Visit: Payer: BC Managed Care – PPO | Admitting: Speech Pathology

## 2022-04-27 ENCOUNTER — Ambulatory Visit (INDEPENDENT_AMBULATORY_CARE_PROVIDER_SITE_OTHER): Payer: BC Managed Care – PPO | Admitting: Student

## 2022-04-27 ENCOUNTER — Encounter: Payer: Self-pay | Admitting: Student

## 2022-04-27 VITALS — BP 151/73 | HR 85 | Temp 98.1°F | Ht 71.0 in | Wt 363.8 lb

## 2022-04-27 DIAGNOSIS — G4733 Obstructive sleep apnea (adult) (pediatric): Secondary | ICD-10-CM

## 2022-04-27 DIAGNOSIS — F17210 Nicotine dependence, cigarettes, uncomplicated: Secondary | ICD-10-CM

## 2022-04-27 DIAGNOSIS — E1165 Type 2 diabetes mellitus with hyperglycemia: Secondary | ICD-10-CM | POA: Diagnosis not present

## 2022-04-27 DIAGNOSIS — I6381 Other cerebral infarction due to occlusion or stenosis of small artery: Secondary | ICD-10-CM

## 2022-04-27 DIAGNOSIS — I10 Essential (primary) hypertension: Secondary | ICD-10-CM

## 2022-04-27 DIAGNOSIS — G459 Transient cerebral ischemic attack, unspecified: Secondary | ICD-10-CM | POA: Diagnosis not present

## 2022-04-27 MED ORDER — MOUNJARO 2.5 MG/0.5ML ~~LOC~~ SOAJ: 2.5000 mg | SUBCUTANEOUS | 0 refills | Status: DC

## 2022-04-27 MED ORDER — METFORMIN HCL 1000 MG PO TABS: 1000.0000 mg | ORAL_TABLET | Freq: Two times a day (BID) | ORAL | 2 refills | Status: DC

## 2022-04-27 MED ORDER — LOSARTAN POTASSIUM-HCTZ 100-12.5 MG PO TABS
1.0000 | ORAL_TABLET | Freq: Every day | ORAL | 2 refills | Status: DC
Start: 2022-04-27 — End: 2022-11-03

## 2022-04-27 NOTE — Progress Notes (Signed)
Subjective:  Reason for visit: Hospital follow up for treatment of stroke.  HPI:  [name] is a 51 y.o. male with a past medical history stated below and presents today for hospital follow-up for treatment of stroke. Please see problem based assessment and plan for additional details.  Past Medical History:  Diagnosis Date   Abdominal lymphadenopathy 06/21/2012   Borderline abdominal/pelvic adenopathy seen incidentally on CT scan 2011.  No change follow up 10/13   DM2 (diabetes mellitus, type 2) (HCC) 06/21/2012   HTN (hypertension), benign 06/21/2012   Left lumbar radiculopathy 06/21/2012   Leukocytosis 06/21/2012   High normal @ 10,000  41 poly, 46% lymphs stable over time 6// to 10/13 no anemia or thrombocytopenia   Migraine    Obesity    Sleep apnea    uses CPAP at night    Current Outpatient Medications on File Prior to Visit  Medication Sig Dispense Refill   amLODipine (NORVASC) 10 MG tablet Take 1 tablet (10 mg total) by mouth every morning. 90 tablet 1   aspirin EC 81 MG tablet Take 1 tablet (81 mg total) by mouth daily. Swallow whole. 30 tablet 12   atorvastatin (LIPITOR) 80 MG tablet Take 1 tablet (80 mg total) by mouth daily. 30 tablet 0   clopidogrel (PLAVIX) 75 MG tablet Take 1 tablet (75 mg total) by mouth daily for 21 days. 21 tablet 0   spironolactone (ALDACTONE) 25 MG tablet Take 1 tablet (25 mg total) by mouth daily. 30 tablet 11   No current facility-administered medications on file prior to visit.    Family History  Problem Relation Age of Onset   Hypertension Mother    Diabetes Mother    Hypertension Father    Deep vein thrombosis Father    Diabetes Brother    Hypertension Brother    CAD Other    Stroke Other     Social History   Socioeconomic History   Marital status: Married    Spouse name: Not on file   Number of children: Not on file   Years of education: Not on file   Highest education level: Not on file  Occupational  History   Not on file  Tobacco Use   Smoking status: Some Days    Types: Cigars   Smokeless tobacco: Never  Vaping Use   Vaping Use: Never used  Substance and Sexual Activity   Alcohol use: Yes    Comment: occ   Drug use: No   Sexual activity: Yes  Other Topics Concern   Not on file  Social History Narrative   Not on file   Social Determinants of Health   Financial Resource Strain: Not on file  Food Insecurity: Not on file  Transportation Needs: No Transportation Needs (04/20/2022)   PRAPARE - Administrator, Civil Service (Medical): No    Lack of Transportation (Non-Medical): No  Physical Activity: Not on file  Stress: Not on file  Social Connections: Not on file  Intimate Partner Violence: Not on file    Review of Systems: ROS negative except for what is noted on the assessment and plan.  Objective:   Vitals:   04/27/22 1509 04/27/22 1556  BP: (!) 156/70 (!) 151/73  Pulse: 90 85  Temp: 98.1 F (36.7 C)   TempSrc: Oral   SpO2: 99%   Weight: (!) 363 lb 12.8 oz (165 kg)   Height: 5\' 11"  (1.803 m)  Physical Exam: General: Alert male in wheelchair. Cardiovascular: Regular rate and rhythm. Pulmonary: Normal work of breathing.  Lungs clear to auscultation. Abdominal: Soft.  Nontender.  Nondistended. Skin: Warm and dry. Neurologic: 4/5 strength left upper and left lower extremity.  Speech is grossly normal. Psychiatric: Pleasant.  Appropriate mood and affect.   Assessment & Plan:  HTN (hypertension), benign Patient has a long history of hypertension that started when he was 51 years old.  Prior to this hospitalization he was treated with amlodipine, spironolactone, and losartan.  His blood pressure in clinic today was 151/73, verified by repeat measurement.  This patient with a recent history of CVA requires aggressive blood pressure management to reduce his risk of future ASCVD events.  I recommend starting hydrochlorothiazide in addition to his  current antihypertensive regimen. - Start losartan-hydrochlorothiazide 100-12.5 mg daily - Continue amlodipine 10 mg daily - Continue spironolactone 25 mg daily - Follow-up in 1 month and repeat BMP.  TIA (transient ischemic attack) Today patient reports feeling improved since discharge.  He continues to experience some left-sided weakness described as mild difficulty with walking and using his left hand (dominant hand).  His wife reports that his speech is still somewhat slurred from baseline.  He also complains of fatigue and decreased appetite.  Overall his mood is good.  On exam this patient has very mildly decreased strength in left upper and left lower extremity.  He is alert and oriented and his speech sounds grossly normal to me.  For this patient, aggressive risk factor control is key.  I reiterated the importance of adherence to his medicine for high blood pressure, high cholesterol, and diabetes.  I explained the contribution of these significant risk factors to the development of his recent stroke.  It will also be important for the patient to follow-up with physical and Occupational Therapy, which he is eager to do. - Continue amlodipine, losartan-HCTZ, spironolactone - Continue high intensity statin therapy - Continue metformin and Mounjaro - Patient will continue DAPT until 05/08/2022 at which point he will continue taking aspirin 81 mg indefinitely. - Follow-up with PT/OT.  Type 2 diabetes mellitus with hyperglycemia (HCC) Patient has history of diabetes managed with metformin.  He acknowledges that he has not always been adherent to his metformin in the past, citing that he felt well and that the importance of this medication was not regularly reiterated.  Patient feels motivated to get his diabetes under control with a combination of medicine, diet modification, and exercise.  He is also interested in pursuing bariatric surgery.  Today's hemoglobin A1c is 7.7.  In this patient  with a recent history of CVA I will manage his diabetes aggressively by increasing his metformin and adding GLP-1 agonist which has the three-pronged benefit of improved glycemic control, weight loss, and improved cardiovascular outcomes. - Increase metformin to 1000 mg twice daily - Start tirzepatide 2.5 mg weekly, to titrate up after 4 weeks of tolerance.  OSA (obstructive sleep apnea) Patient complains of daytime sleepiness and fatigue.  He is adherent to his CPAP.  He has been using it for 10 years.  He says it is almost time to get a new machine.  He requests a repeat sleep study, which is a requirement for bariatric surgery.   Patient seen with Dr. Durene Romans, M.D. [facility]  PGY-1 Pager: [phone] Date 04/27/2022  Time 9:57 PM

## 2022-04-27 NOTE — Assessment & Plan Note (Signed)
Patient complains of daytime sleepiness and fatigue.  He is adherent to his CPAP.  He has been using it for 10 years.  He says it is almost time to get a new machine.  He requests a repeat sleep study, which is a requirement for bariatric surgery.

## 2022-04-27 NOTE — Assessment & Plan Note (Signed)
Patient has a long history of hypertension that started when he was 51 years old.  Prior to this hospitalization he was treated with amlodipine, spironolactone, and losartan.  His blood pressure in clinic today was 151/73, verified by repeat measurement.  This patient with a recent history of CVA requires aggressive blood pressure management to reduce his risk of future ASCVD events.  I recommend starting hydrochlorothiazide in addition to his current antihypertensive regimen. - Start losartan-hydrochlorothiazide 100-12.5 mg daily - Continue amlodipine 10 mg daily - Continue spironolactone 25 mg daily - Follow-up in 1 month and repeat BMP.

## 2022-04-27 NOTE — Patient Instructions (Signed)
Thank you, [name] for allowing [place] to provide your care today. Today we discussed diabetes, hypertension, and stroke prevention.  I have arranged an appointment with our diabetes coordinator. Expect to hear from her within a week. She will offer dietary counseling and show you how to use your new injectable medication.  I have ordered the following labs for you:  Lab Orders         BMP8+Anion Gap      Referrals ordered today:   Referral Orders         Ambulatory referral to Physical Therapy         Ambulatory referral to Occupational Therapy         Amb ref to Medical Nutrition Therapy-MNT      I have ordered the following medication/changed the following medications:   Stop the following medications: Medications Discontinued During This Encounter  Medication Reason   metFORMIN (GLUCOPHAGE) 500 MG tablet    losartan (COZAAR) 100 MG tablet      Start the following medications: Meds ordered this encounter  Medications   metFORMIN (GLUCOPHAGE) 1000 MG tablet    Sig: Take 1 tablet (1,000 mg total) by mouth 2 (two) times daily with a meal.    Dispense:  60 tablet    Refill:  2   tirzepatide (MOUNJARO) 2.5 MG/0.5ML Pen    Sig: Inject 2.5 mg into the skin once a week.    Dispense:  2 mL    Refill:  0   losartan-hydrochlorothiazide (HYZAAR) 100-12.5 MG tablet    Sig: Take 1 tablet by mouth daily.    Dispense:  60 tablet    Refill:  2     Follow up: 1 month   We look forward to seeing you next time. Please call our clinic at [phone] if you have any questions or concerns. The best time to call is Monday-Friday from 9am-4pm, but there is someone available 24/7. If after hours or the weekend, call the main hospital number and ask for the Internal Medicine Resident On-Call. If you need medication refills, please notify your pharmacy one week in advance and they will send [place] a request.   Thank you for trusting me with your care. Wishing you the best!   Travis Mood, MD [facility]

## 2022-04-27 NOTE — Assessment & Plan Note (Signed)
Patient has history of diabetes managed with metformin.  He acknowledges that he has not always been adherent to his metformin in the past, citing that he felt well and that the importance of this medication was not regularly reiterated.  Patient feels motivated to get his diabetes under control with a combination of medicine, diet modification, and exercise.  He is also interested in pursuing bariatric surgery.  Today's hemoglobin A1c is 7.7.  In this patient with a recent history of CVA I will manage his diabetes aggressively by increasing his metformin and adding GLP-1 agonist which has the three-pronged benefit of improved glycemic control, weight loss, and improved cardiovascular outcomes. - Increase metformin to 1000 mg twice daily - Start tirzepatide 2.5 mg weekly, to titrate up after 4 weeks of tolerance.

## 2022-04-27 NOTE — Progress Notes (Deleted)
Sleep study

## 2022-04-27 NOTE — Assessment & Plan Note (Addendum)
Today patient reports feeling improved since discharge.  He continues to experience some left-sided weakness described as mild difficulty with walking and using his left hand (dominant hand).  His wife reports that his speech is still somewhat slurred from baseline.  He also complains of fatigue and decreased appetite.  Overall his mood is good.  On exam this patient has very mildly decreased strength in left upper and left lower extremity.  He is alert and oriented and his speech sounds grossly normal to me.  For this patient, aggressive risk factor control is key.  I reiterated the importance of adherence to his medicine for high blood pressure, high cholesterol, and diabetes.  I explained the contribution of these significant risk factors to the development of his recent stroke.  It will also be important for the patient to follow-up with physical and Occupational Therapy, which he is eager to do. - Continue amlodipine, losartan-HCTZ, spironolactone - Continue high intensity statin therapy - Continue metformin and Mounjaro - Patient will continue DAPT until 05/08/2022 at which point he will continue taking aspirin 81 mg indefinitely. - Follow-up with PT/OT.

## 2022-04-28 LAB — BMP8+ANION GAP
Anion Gap: 18 mmol/L (ref 10.0–18.0)
BUN/Creatinine Ratio: 11 (ref 9–20)
BUN: 12 mg/dL (ref 6–24)
CO2: 20 mmol/L (ref 20–29)
Calcium: 9.7 mg/dL (ref 8.7–10.2)
Chloride: 100 mmol/L (ref 96–106)
Creatinine, Ser: 1.05 mg/dL (ref 0.76–1.27)
Glucose: 186 mg/dL — ABNORMAL HIGH (ref 70–99)
Potassium: 3.5 mmol/L (ref 3.5–5.2)
Sodium: 138 mmol/L (ref 134–144)
eGFR: 85 mL/min/{1.73_m2} (ref >59)

## 2022-04-28 NOTE — Progress Notes (Signed)
Internal Medicine Clinic Attending  I saw and evaluated the patient.  I personally confirmed the key portions of the history and exam documented by Dr. McLendon and I reviewed pertinent patient test results.  The assessment, diagnosis, and plan were formulated together and I agree with the documentation in the resident's note.  

## 2022-04-28 NOTE — Therapy (Signed)
OUTPATIENT SPEECH LANGUAGE PATHOLOGY EVALUATION   Patient Name: Travis Powell MRN: [id number] DOB:27-Mar-1971, 51 y.o., male Today's Date: 04/29/2022  PCP: Belva Agee, MD REFERRING PROVIDER: Micki Riley, MD   End of Session - 04/29/22 1605     Visit Number 1    Number of Visits 1    SLP Start Time 1318    SLP Stop Time  1400    SLP Time Calculation (min) 42 min    Activity Tolerance Patient tolerated treatment well             Past Medical History:  Diagnosis Date   Abdominal lymphadenopathy 06/21/2012   Borderline abdominal/pelvic adenopathy seen incidentally on CT scan 2011.  No change follow up 10/13   DM2 (diabetes mellitus, type 2) (HCC) 06/21/2012   HTN (hypertension), benign 06/21/2012   Left lumbar radiculopathy 06/21/2012   Leukocytosis 06/21/2012   High normal @ 10,000  41 poly, 46% lymphs stable over time 6// to 10/13 no anemia or thrombocytopenia   Migraine    Obesity    Sleep apnea    uses CPAP at night   Past Surgical History:  Procedure Laterality Date   CARPAL TUNNEL RELEASE     Patient Active Problem List   Diagnosis Date Noted   CVA (cerebral vascular accident) (HCC) 04/15/2022   Morbid obesity (HCC) 02/06/2019   OSA (obstructive sleep apnea) 06/18/2014   Hyperlipidemia 06/18/2014   Abdominal lymphadenopathy 06/21/2012   Type 2 diabetes mellitus with hyperglycemia (HCC) 06/21/2012   HTN (hypertension), benign 06/21/2012    ONSET DATE: 04/15/2022   REFERRING DIAG: I63.50 (ICD-10-CM) - Right pontine cerebrovascular accident (HCC)  THERAPY DIAG:  Dysarthria and anarthria  Rationale for Evaluation and Treatment Rehabilitation  SUBJECTIVE:   SUBJECTIVE STATEMENT: "I do pretty good unless I go to fast"  Pt accompanied by: self  PERTINENT HISTORY: presented to the ED on 8/31 with L hand weakness and speech changes. MRI brain 9/1: Small acute right pontine infarcts. PMH: morbid obesity, hypertension, hyperlipidemia,  diabetes, and OSA on CPAP.  PAIN:  Are you having pain? No   FALLS: Has patient fallen in last 6 months?  No  LIVING ENVIRONMENT: Lives with: lives with their spouse Lives in: House/apartment  PLOF:  Level of assistance: Independent with IADLs Employment: Full-time employment   PATIENT GOALS "to get back to where I was before"   OBJECTIVE:   DIAGNOSTIC FINDINGS: 04/16/2022 MR BRAIN IMPRESSION: 1. Small acute right pontine infarcts without hemorrhage or mass effect. 2. Small remote frontal lobe infarcts.  COGNITION: Overall cognitive status: Impaired Areas of impairment:  Executive function: Impaired: Slow processing Functional deficits: Slightly reduced processing demonstrated during session today, with independent use of strategy to repeat back and confirm understanding. Use of strategy indicates awareness. Probed through use of motivational interviewing for additional cognitive impairments that may impact return to independence and work, pt unable to ID, only concern re: return to work is arm movement.   EXPRESSION: verbal   MOTOR SPEECH: Overall motor speech: impaired Level of impairment: Conversation Respiration: thoracic breathing Phonation: normal Resonance: WFL Articulation: Appears intact Intelligibility: Intelligible Motor planning: Appears intact Effective technique: slow rate Comments: Pt reporting speech impairment evidenced when speaking quickly, but with reduced rate speech is clear. Oral mechanism exam unremarkable. Diadochokinetic rate evidences regular rhythm, appropriate rate, WNL motor planning. Emotional expression WNL. Thoughout entirety of session, across varity of tasks, speech maintained clarity, 100% intelligible, natural sounding, and efficient motor movements observed. Speech negative for dysfluency,  able to demonstrate contrastive stress given model. X1 instance of articulatory error in producing /str/ across speech tasks and conversational  speech.   STANDARDIZED ASSESSMENTS: SLUMS: 24/30 (>27 normal limits) Day of week wrong, pt attributes to being out of routine and leaving for vacation tomorrow. 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 SLP did not count as correct in scoring, but ability to correct is notable in dynamic assessment.    TODAY'S TREATMENT:  SLP provided education on observations and finding of evaluation. Provided training and education for processing strategies to aid in minimal delayed processing evidenced during today's session. SLP advised ST does not appear to be clinically indicated at this time. Pt in agreement.    PATIENT EDUCATION: Education details: see above Person educated: Patient Education method: Explanation, Demonstration, and Handouts Education comprehension: verbalized understanding, returned demonstration, and needs further education  ASSESSMENT:  CLINICAL IMPRESSION: Patient is a 51 y.o. M who was seen today for cognitive linguistic eval s/p R potine infarcts. Pt presenting with with gross normal limits speech, however reporting mild dysarthria at home when speaking fast. Across variety of speech tasks, with varying complexities, no dysarthria observed this date. Speech was natural, efficient, 100% intelligible and free from dysfluencies. Motor speech tasks demonstrate intact motor planning and speech is free from dysfluencies. Cognition characterized by slightly decreased processing speed, however demonstrates independent use of processing strategies in session. Pt's goal is to return to work, tells SLP biggest barrier at this time is motor movement of arm. Skilled ST is not indicated at this time. Pt verbalizes understanding. SLP provides education on how to seek new referral should needs arise in future re:  cognition or communication.    PLAN: SLP FREQUENCY: one time visit   Maia Breslow, CCC-SLP 04/29/2022, 4:22 PM

## 2022-04-29 ENCOUNTER — Encounter: Payer: Self-pay | Admitting: Speech Pathology

## 2022-04-29 ENCOUNTER — Ambulatory Visit: Payer: BC Managed Care – PPO | Attending: Internal Medicine | Admitting: Speech Pathology

## 2022-04-29 ENCOUNTER — Other Ambulatory Visit: Payer: Self-pay

## 2022-04-29 DIAGNOSIS — R2681 Unsteadiness on feet: Secondary | ICD-10-CM | POA: Insufficient documentation

## 2022-04-29 DIAGNOSIS — R471 Dysarthria and anarthria: Secondary | ICD-10-CM | POA: Diagnosis not present

## 2022-04-29 DIAGNOSIS — M6281 Muscle weakness (generalized): Secondary | ICD-10-CM | POA: Insufficient documentation

## 2022-05-04 ENCOUNTER — Ambulatory Visit: Payer: BC Managed Care – PPO | Admitting: Physical Therapy

## 2022-05-04 ENCOUNTER — Encounter: Payer: Self-pay | Admitting: Physical Therapy

## 2022-05-04 ENCOUNTER — Other Ambulatory Visit: Payer: Self-pay

## 2022-05-04 VITALS — BP 134/61 | HR 105

## 2022-05-04 DIAGNOSIS — R2681 Unsteadiness on feet: Secondary | ICD-10-CM | POA: Diagnosis not present

## 2022-05-04 DIAGNOSIS — M6281 Muscle weakness (generalized): Secondary | ICD-10-CM | POA: Diagnosis not present

## 2022-05-04 DIAGNOSIS — R471 Dysarthria and anarthria: Secondary | ICD-10-CM | POA: Diagnosis not present

## 2022-05-04 NOTE — Therapy (Signed)
OUTPATIENT PHYSICAL THERAPY NEURO EVALUATION   Patient Name: CLARICE BONAVENTURE MRN: [id number] DOB:1971-02-26, 51 y.o., male Today's Date: 05/04/2022   PCP: [name], MD REFERRING PROVIDER: Aldine Contes, MD    PT End of Session - 05/04/22 1329     Visit Number 1    Authorization Type BLUE CROSS BLUE SHIELD    PT Start Time 1320   Pt late   PT Stop Time 1403    PT Time Calculation (min) 43 min    Equipment Utilized During Treatment Gait belt    Activity Tolerance Patient tolerated treatment well    Behavior During Therapy WFL for tasks assessed/performed             Past Medical History:  Diagnosis Date   Abdominal lymphadenopathy 06/21/2012   Borderline abdominal/pelvic adenopathy seen incidentally on CT scan 2011.  No change follow up 10/13   DM2 (diabetes mellitus, type 2) ([place]) 06/21/2012   HTN (hypertension), benign 06/21/2012   Left lumbar radiculopathy 06/21/2012   Leukocytosis 06/21/2012   High normal @ 10,000  41 poly, 46% lymphs stable over time 6// to 10/13 no anemia or thrombocytopenia   Migraine    Obesity    Sleep apnea    uses CPAP at night   Past Surgical History:  Procedure Laterality Date   CARPAL TUNNEL RELEASE     Patient Active Problem List   Diagnosis Date Noted   CVA (cerebral vascular accident) ([place]) 04/15/2022   Morbid obesity ([place]) 02/06/2019   OSA (obstructive sleep apnea) 06/18/2014   Hyperlipidemia 06/18/2014   Abdominal lymphadenopathy 06/21/2012   Type 2 diabetes mellitus with hyperglycemia ([place]) 06/21/2012   HTN (hypertension), benign 06/21/2012    ONSET DATE: 04/27/2022   REFERRING DIAG: G45.9 (ICD-10-CM) - TIA (transient ischemic attack)   THERAPY DIAG:  Muscle weakness (generalized)  Unsteadiness on feet  Rationale for Evaluation and Treatment Rehabilitation  SUBJECTIVE:                                                                                                                                                                                               SUBJECTIVE STATEMENT: "I still have a limp.  It is a little hard to get up from a chair.  My handwriting is not back to what it was." Pt accompanied by: self  He is left-handed.  PERTINENT HISTORY: DM2, HTN, OSA, HLD  PAIN:  Are you having pain? No  PRECAUTIONS: Fall  WEIGHT BEARING RESTRICTIONS No  FALLS: Has patient fallen in last 6 months? No  LIVING ENVIRONMENT: Lives with: lives with their spouse Lives in: House/apartment  Stairs: Yes: Internal: 16 steps; on right going up Has following equipment at home: Single point cane and Crutches  PLOF: Independent with gait, Needs assistance with ADLs, Needs assistance with homemaking, Needs assistance with transfers, and only needs assistance getting out of chair if he sits for too long  PATIENT GOALS "To get back to where I was as much as possible."  OBJECTIVE:   DIAGNOSTIC FINDINGS: 1. Small acute right pontine infarcts without hemorrhage or mass effect. 2. Small remote frontal lobe infarcts.  COGNITION: Overall cognitive status: Impaired and he feels if he speaks too fast he gets tired, sometimes he has problems w/ the "sk" part of words   SENSATION: WFL  COORDINATION: Heel-to-shin:  WFL LE RAMS:  WFL  EDEMA:  None noted in BLE  MUSCLE TONE: none noted during functional assessment.  POSTURE: rounded shoulders, forward head, and increased thoracic kyphosis  LOWER EXTREMITY ROM:     Active  Right Eval Left Eval  Hip flexion [facility]  Hip extension    Hip abduction    Hip adduction    Hip internal rotation    Hip external rotation    Knee flexion " "  Knee extension " "  Ankle dorsiflexion " "  Ankle plantarflexion    Ankle inversion    Ankle eversion     (Blank rows = not tested)  LOWER EXTREMITY MMT:    MMT Right Eval Left Eval  Hip flexion 5/5 4/5  Hip extension    Hip abduction    Hip adduction    Hip internal rotation    Hip  external rotation    Knee flexion 5/5 4+/5  Knee extension 5/5 4+/5  Ankle dorsiflexion 5/5 5/5  Ankle plantarflexion    Ankle inversion    Ankle eversion    (Blank rows = not tested)  BED MOBILITY:  Sit to supine Complete Independence Supine to sit Complete Independence  TRANSFERS: Assistive device utilized: None  Sit to stand: Modified independence-uses armrests Stand to sit: Modified independence Chair to chair: Modified independence  STAIRS:  Level of Assistance: Modified independence  Stair Negotiation Technique: Step to Pattern Forwards with Single Rail on Right  Number of Stairs: 4   Height of Stairs: 6"  Comments: He uses up with right and down with left  GAIT: Gait pattern: step through pattern, decreased stance time- Left, and decreased stride length Distance walked: 352' + various clinic distances Assistive device utilized:  only uses AD if gout flares up and None Level of assistance: Complete Independence Comments: Pt walks w/ steady gait, no instances of LOB or tripping.  Only mildly decreased stance on LLE, not significant enough to consider a limp.  FUNCTIONAL TESTs:  5 times sit to stand: 16.03 sec no UE 2 minute walk test: 352' no AD 10 meter walk test: 10.47 sec = 0.96 m/sec OR 3.15 ft/sec  PATIENT SURVEYS:  FOTO not captured.  TODAY'S TREATMENT:  Reviewed walking program. Initiated HEP.   PATIENT EDUCATION: Education details: PT POC, assessments used.  Sent pt to schedule OT eval-explained they will address handwriting. Person educated: Patient Education method: Explanation Education comprehension: verbalized understanding   HOME EXERCISE PROGRAM: Access Code: [email or web address] URL: [email or web address] Date: 05/04/2022 Prepared by: Camille Bal  Exercises - Sit to Stand with Hands on Knees  - 1 x daily - 5 x weekly - 3 sets - 10 reps - Standing Single Leg Stance with Counter Support  - 1 x daily - 5 x weekly -  1 sets - 4  reps - 30 seconds hold - Forward Step Up with Counter Support  - 1 x daily - 5 x weekly - 3 sets - 10 reps  You Can Walk For A Certain Length Of Time Each Day                          Walk 5 minutes 3 times per day.             Increase 2-3  minutes every 7 days              Work up to 20 minutes (1-2 times per day).               Example:                         Day 1-2           4-5 minutes     3 times per day                         Day 7-8           10-12 minutes 2-3 times per day                         Day 13-14       20-22 minutes 1-2 times per day   ASSESSMENT:  CLINICAL IMPRESSION: Patient is a 51 y.o. male who was seen today for physical therapy evaluation and treatment following recent TIA.  Pt has a significant PMH of DM2, HTN, OSA, and HLD.  Identified impairments include mild strength deficit on the left LE vs RLE and mildly dec left stance during prolonged ambulation.  Evaluation via the following assessment tools: 5xSTS and indicate low fall risk with values close to normal for age and diagnosis.  At this time he does not require skilled PT services, but PT initiated HEP and walking program to address confidence and strength deficits of the LLE.  CLINICAL DECISION MAKING: Stable/uncomplicated  EVALUATION COMPLEXITY: Low  PLAN: PT FREQUENCY: one time visit  PT DURATION: other: one time visit  Sadie Haber, PT, DPT 05/04/2022, 2:07 PM

## 2022-05-09 ENCOUNTER — Other Ambulatory Visit: Payer: Self-pay | Admitting: Internal Medicine

## 2022-05-11 ENCOUNTER — Encounter: Payer: Self-pay | Admitting: Neurology

## 2022-05-11 ENCOUNTER — Ambulatory Visit (INDEPENDENT_AMBULATORY_CARE_PROVIDER_SITE_OTHER): Payer: BC Managed Care – PPO | Admitting: Neurology

## 2022-05-11 VITALS — BP 158/90 | HR 78 | Ht 71.0 in | Wt 356.6 lb

## 2022-05-11 DIAGNOSIS — G4733 Obstructive sleep apnea (adult) (pediatric): Secondary | ICD-10-CM | POA: Diagnosis not present

## 2022-05-11 DIAGNOSIS — R351 Nocturia: Secondary | ICD-10-CM

## 2022-05-11 DIAGNOSIS — Z820 Family history of epilepsy and other diseases of the nervous system: Secondary | ICD-10-CM

## 2022-05-11 DIAGNOSIS — Z9989 Dependence on other enabling machines and devices: Secondary | ICD-10-CM

## 2022-05-11 DIAGNOSIS — Z8673 Personal history of transient ischemic attack (TIA), and cerebral infarction without residual deficits: Secondary | ICD-10-CM | POA: Diagnosis not present
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
## 2022-05-11 NOTE — Patient Instructions (Addendum)
Thank you for choosing [facility] for your sleep related care! °It was nice to meet you today! I appreciate that you entrust me with your sleep related healthcare concerns. I hope, I was able to address at least some of your concerns today, and that I can help you feel reassured and also get better.   ° °Here is what we discussed today and what we came up with as our plan for you:  °  °Based on your symptoms and your exam I believe you are still at risk for obstructive sleep apnea and would benefit from re-evaluation as it has been many years and you need a new machine. Therefore, I think we should proceed with a sleep study to determine how severe your sleep apnea is. If you have more than mild OSA, I want you to consider ongoing treatment with CPAP. Please remember, the risks and ramifications of moderate to severe obstructive sleep apnea or OSA are: Cardiovascular disease, including congestive heart failure, stroke, difficult to control hypertension, arrhythmias, and even type 2 diabetes has been linked to untreated OSA. Sleep apnea causes disruption of sleep and sleep deprivation in most cases, which, in turn, can cause recurrent headaches, problems with memory, mood, concentration, focus, and vigilance. Most people with untreated sleep apnea report excessive daytime sleepiness, which can affect their ability to drive. Please do not drive if you feel sleepy.  ° °I will likely see you back after your sleep study to go over the test results and where to go from there. We will call you after your sleep study to advise about the results (most likely, you will hear from Kristen, my nurse) and to set up an appointment at the time, as necessary.   ° °Our sleep lab administrative assistant will call you to schedule your sleep study. If you don't hear back from her by about 2 weeks from now, please feel free to call her at [phone]. You can leave a message with your phone number and concerns, if  you get the voicemail box. She will call back as soon as possible.  ° ° °

## 2022-05-13 ENCOUNTER — Telehealth: Payer: Self-pay | Admitting: Neurology

## 2022-05-13 NOTE — Telephone Encounter (Signed)
Patient wife Demetrice called  and left a voicemail on my phone stating they are not sure why a sleep study was ordered for him and would like a call back to explain to them why it was ordered.

## 2022-05-13 NOTE — Telephone Encounter (Signed)
Sleep study is needed to establish his diagnosis of sleep apnea and to qualify him for a new machine.

## 2022-05-13 NOTE — Telephone Encounter (Signed)
Called pt & LVM (ok per DPR) explaining that a sleep study is needed to establish the diagnosis of sleep apnea and to qualify him for a new machine. Our office will be in touch to schedule.

## 2022-05-17 ENCOUNTER — Other Ambulatory Visit: Payer: Self-pay | Admitting: General Surgery

## 2022-05-17 ENCOUNTER — Other Ambulatory Visit (HOSPITAL_COMMUNITY): Payer: Self-pay | Admitting: General Surgery

## 2022-05-20 ENCOUNTER — Telehealth: Payer: Self-pay

## 2022-05-20 ENCOUNTER — Ambulatory Visit: Payer: BC Managed Care – PPO | Attending: Internal Medicine | Admitting: Occupational Therapy

## 2022-05-20 NOTE — Telephone Encounter (Signed)
Patients spouse called regarding fmla paperwork.

## 2022-05-24 ENCOUNTER — Other Ambulatory Visit: Payer: Self-pay | Admitting: Student

## 2022-05-24 DIAGNOSIS — E1165 Type 2 diabetes mellitus with hyperglycemia: Secondary | ICD-10-CM

## 2022-05-25 ENCOUNTER — Other Ambulatory Visit: Payer: Self-pay | Admitting: Student

## 2022-05-25 DIAGNOSIS — E1165 Type 2 diabetes mellitus with hyperglycemia: Secondary | ICD-10-CM

## 2022-05-28 ENCOUNTER — Other Ambulatory Visit (HOSPITAL_COMMUNITY): Payer: Self-pay | Admitting: General Surgery

## 2022-05-28 ENCOUNTER — Ambulatory Visit (HOSPITAL_COMMUNITY)
Admission: RE | Admit: 2022-05-28 | Discharge: 2022-05-28 | Disposition: A | Discharge status: Home / Self Care | Payer: BC Managed Care – PPO | Source: Ambulatory Visit | Attending: General Surgery | Admitting: General Surgery

## 2022-05-28 DIAGNOSIS — Z01818 Encounter for other preprocedural examination: Secondary | ICD-10-CM | POA: Diagnosis not present

## 2022-06-04 NOTE — Telephone Encounter (Signed)
Thanks Dr. Saverio Danker!

## 2022-06-04 NOTE — Telephone Encounter (Signed)
Spoke with [employer], paperwork has been completed.

## 2022-06-04 NOTE — Telephone Encounter (Signed)
I had panel management yesterday and there was no FMLA paperwork for Travis Powell.

## 2022-06-09 NOTE — Telephone Encounter (Signed)
Sent mychart message

## 2022-06-10 NOTE — Telephone Encounter (Signed)
Patient returned my call.  MAIL OUT HST-[employer] no auth req spoke to [place] ref # [id number]

## 2022-06-10 NOTE — Telephone Encounter (Signed)
LVM for pt to call back to schedule.

## 2022-06-16 ENCOUNTER — Ambulatory Visit: Payer: BC Managed Care – PPO | Admitting: Neurology

## 2022-06-16 DIAGNOSIS — G4733 Obstructive sleep apnea (adult) (pediatric): Secondary | ICD-10-CM

## 2022-06-16 DIAGNOSIS — R351 Nocturia: Secondary | ICD-10-CM

## 2022-06-16 DIAGNOSIS — Z820 Family history of epilepsy and other diseases of the nervous system: Secondary | ICD-10-CM

## 2022-06-16 DIAGNOSIS — Z8673 Personal history of transient ischemic attack (TIA), and cerebral infarction without residual deficits: Secondary | ICD-10-CM

## 2022-06-16 DIAGNOSIS — G4734 Idiopathic sleep related nonobstructive alveolar hypoventilation: Secondary | ICD-10-CM

## 2022-06-16 DIAGNOSIS — G4731 Primary central sleep apnea: Secondary | ICD-10-CM

## 2022-06-17 ENCOUNTER — Telehealth: Payer: Self-pay

## 2022-06-17 ENCOUNTER — Telehealth: Payer: Self-pay | Admitting: *Deleted

## 2022-06-17 DIAGNOSIS — G4733 Obstructive sleep apnea (adult) (pediatric): Secondary | ICD-10-CM | POA: Diagnosis not present

## 2022-06-17 DIAGNOSIS — M1710 Unilateral primary osteoarthritis, unspecified knee: Secondary | ICD-10-CM | POA: Diagnosis not present

## 2022-06-17 DIAGNOSIS — Z8673 Personal history of transient ischemic attack (TIA), and cerebral infarction without residual deficits: Secondary | ICD-10-CM | POA: Insufficient documentation

## 2022-06-17 DIAGNOSIS — E785 Hyperlipidemia, unspecified: Secondary | ICD-10-CM | POA: Diagnosis not present

## 2022-06-17 NOTE — Patient Outreach (Signed)
  Care Coordination   Initial Visit Note   06/17/2022 Name: HARJOT DIBELLO MRN: [id number] DOB: [birth year]  SHENANDOAH VANDERGRIFF is a 51 y.o. year old male who sees [name], MD for primary care. I spoke with  Hilbert Odor by phone today.  What matters to the patients health and wellness today?  "I am doing well at this time.  I don't need any assistance or care coordination services but will call if anything comes up".    Goals Addressed               This Visit's Progress     COMPLETED: "I don't need any assistance at this time" (pt-stated)        Care Coordination Interventions: Discussed plans with patient for ongoing care management follow up and provided patient with direct contact information for care management team Assessed social determinant of health barriers Patient decline Care Coordination services at this time.  He will contact [facility] if he changes his mind in the future.        SDOH assessments and interventions completed:  Yes  SDOH Interventions Today    Flowsheet Row Most Recent Value  SDOH Interventions   Housing Interventions Intervention Not Indicated  Transportation Interventions Intervention Not Indicated        Care Coordination Interventions Activated:  Yes  Care Coordination Interventions:  Yes, provided   Follow up plan: No further intervention required.   Encounter Outcome:  Pt. Visit Completed

## 2022-06-17 NOTE — Telephone Encounter (Signed)
Received fax from [facility]. Asking for Clearance for pt to have Bariatric Surgery.  Had HST 06-16-2022. Letter in inbox.

## 2022-06-21 ENCOUNTER — Ambulatory Visit: Payer: BC Managed Care – PPO | Admitting: Skilled Nursing Facility1

## 2022-06-22 ENCOUNTER — Telehealth: Payer: Self-pay | Admitting: Dietician

## 2022-06-22 NOTE — Telephone Encounter (Signed)
Left voicemail for return call  

## 2022-06-23 NOTE — Telephone Encounter (Signed)
Received a call from [place] @ [facility]. She stated the request for clearance for bariatric surgery is actually for the TIA and hospitalization the patient had on 04/15/22. They do want the patient to move forward with the sleep consult as he has a CPAP machine that isn't working well but the clearance request isn't directly related to sleep. Surgery date is planned for 07/20/22. [place] stated that Dr Andrey Campanile had messaged Dr Frances Furbish. They understand Dr Frances Furbish saw patient for sleep consult only. If she is unable to clear, they would have to ask another physician but unsure who. Jordan's direct number for a call back is [phone].

## 2022-06-23 NOTE — Telephone Encounter (Signed)
I apologize for misunderstanding the surgical clearance, I was waiting for the patient to have his sleep test done.  I have not seen him for TIA or stroke.  I recommend surgical clearance from a cardiologist or PCP.  We will await sleep test results, he is supposed to do a home sleep test.

## 2022-06-23 NOTE — Telephone Encounter (Signed)
Spoke with [place] and spoke with Dr Frances Furbish. Dr Andrey Campanile requests neurology clearance. We requested a referral for consultation and then we will schedule pt for an appt dedicated to TIA/hospital f/u for clearance for bariatric surgery.

## 2022-06-23 NOTE — Telephone Encounter (Signed)
Travis Powell called and said the referral has been placed and she will also fax it to [place]. Provided fax # [phone]).

## 2022-06-23 NOTE — Telephone Encounter (Signed)
Referral received and given to [facility] referrals dept for processing.

## 2022-06-24 ENCOUNTER — Encounter: Payer: Self-pay | Admitting: Neurology

## 2022-06-24 ENCOUNTER — Ambulatory Visit (INDEPENDENT_AMBULATORY_CARE_PROVIDER_SITE_OTHER): Payer: BC Managed Care – PPO | Admitting: Neurology

## 2022-06-24 VITALS — BP 162/103 | HR 86 | Ht 71.0 in | Wt 351.6 lb

## 2022-06-24 DIAGNOSIS — I10 Essential (primary) hypertension: Secondary | ICD-10-CM | POA: Diagnosis not present

## 2022-06-24 DIAGNOSIS — I6350 Cerebral infarction due to unspecified occlusion or stenosis of unspecified cerebral artery: Secondary | ICD-10-CM | POA: Diagnosis not present

## 2022-06-24 DIAGNOSIS — G4733 Obstructive sleep apnea (adult) (pediatric): Secondary | ICD-10-CM | POA: Diagnosis not present

## 2022-06-24 DIAGNOSIS — I6381 Other cerebral infarction due to occlusion or stenosis of small artery: Secondary | ICD-10-CM

## 2022-06-24 DIAGNOSIS — Z6841 Body Mass Index (BMI) 40.0 and over, adult: Secondary | ICD-10-CM

## 2022-06-24 NOTE — Patient Instructions (Addendum)
It was nice to see you again today.  Your blood pressure is rather high today.  I am not comfortable clearing you for surgery at this point yet, since your blood pressure is not well controlled. I agree with Dr. Andrey Campanile, that you should have seen the stroke specialist in follow up after your stroke admission in August. I will ask you to see Dr. Pearlean Brownie in 3 months in this clinic, in the meantime you can do the home sleep test; the equipment was mailed out to you recently. We will make a follow up appointment in sleep clinic after the home sleep test is completed.   Please make a follow up appointment with your primary care as soon as possible for blood pressure management and discuss referral to a cardiologist. You may benefit from getting an implanted heart monitor (loop recorder).
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
## 2022-06-28 ENCOUNTER — Encounter: Payer: BC Managed Care – PPO | Attending: General Surgery | Admitting: Skilled Nursing Facility1

## 2022-06-28 ENCOUNTER — Encounter: Payer: Self-pay | Admitting: Neurology

## 2022-06-28 ENCOUNTER — Ambulatory Visit (INDEPENDENT_AMBULATORY_CARE_PROVIDER_SITE_OTHER): Payer: BC Managed Care – PPO | Admitting: Neurology

## 2022-06-28 VITALS — BP 185/72 | HR 94 | Ht 71.0 in | Wt 350.0 lb

## 2022-06-28 VITALS — Wt 348.8 lb

## 2022-06-28 DIAGNOSIS — I6381 Other cerebral infarction due to occlusion or stenosis of small artery: Secondary | ICD-10-CM

## 2022-06-28 DIAGNOSIS — E1165 Type 2 diabetes mellitus with hyperglycemia: Secondary | ICD-10-CM | POA: Diagnosis not present

## 2022-06-28 DIAGNOSIS — I6350 Cerebral infarction due to unspecified occlusion or stenosis of unspecified cerebral artery: Secondary | ICD-10-CM | POA: Diagnosis not present

## 2022-06-28 NOTE — Progress Notes (Signed)
Pre-Operative Nutrition Class:    Patient was seen on 06/28/2022 for Pre-Operative Bariatric Surgery Education at [facility].    Surgery date:  Surgery type: sleeve gastrectomy  Start weight at [facility]: 351 Weight today: 348.8  Samples given per MNT protocol. Patient educated on appropriate usage:  [place] Lot # [id number] Exp: 9/26   Bariatric Advantage Calcium  Lot # [id number] Exp:07/06/2022   Ensure Max Protein Shake Lot # [id number] Exp: [id number]  The following the learning objectives were met by the patient during this course: Identify Pre-Op Dietary Goals and will begin 2 weeks pre-operatively Identify appropriate sources of fluids and proteins  State protein recommendations and appropriate sources pre and post-operatively Identify Post-Operative Dietary Goals and will follow for 2 weeks post-operatively Identify appropriate multivitamin and calcium sources Describe the need for physical activity post-operatively and will follow MD recommendations State when to call healthcare provider regarding medication questions or post-operative complications When having a diagnosis of diabetes understanding hypoglycemia symptoms and the inclusion of 1 complex carbohydrate per meal  Handouts given during class include: Pre-Op Bariatric Surgery Diet Handout Protein Shake Handout Post-Op Bariatric Surgery Nutrition Handout BELT Program Information Flyer Support Group Information Flyer [facility] Bariatric Supplements Price List  Follow-Up Plan: Patient will follow-up at [facility] 2 weeks post operatively for diet advancement per MD.

## 2022-06-28 NOTE — Progress Notes (Signed)
[facility] [address] [place]. [place] [address] [phone]       OFFICE FOLLOW-UP NOTE  Travis Powell Date of Birth:  Mar 08, 1971 Medical Record Number:  [id number]   HPI: [name] is a 51 year old African-American male seen today for initial office follow-up visit following hospital consultation for stroke September 2023.  History is obtained from the patient and review of electronic medical records and I personally reviewed pertinent available imaging films in PACS.  He has past medical history of obesity, diabetes, hypertension, obstructive sleep apnea and hyperlipidemia.  He presented on 04/15/2022 with sudden onset of transient slurred speech and left hand weakness and clumsiness upon arising from sleep on 04/13/2022.  He noticed that he was dropping telephone from his left hand and gradually symptoms resolved with the day went on.  Did not seek medical help if needed.  He was fine the next day.  The day after when he woke up he had recurrence of the same symptoms along with left leg weakness.  Symptoms again resolved by the time he reached the hospital..  MRI scan showed 2 tiny punctate right pontine lacunar infarcts.  CT angiogram was limited due to suboptimal contrast bolus injection and  images acquisition.  Carotid ultrasound showed no significant extracranial stenosis.  2D echo showed normal ejection fraction without cardiac source of embolism.  LDL cholesterol was elevated at 157 mg percent and hemoglobin A1c at 7.4.  Patient was started on aspirin 81 and Plavix 75 mg daily for 3 weeks followed by aspirin alone.  He states he is done well since discharge.  He has had no recurrence of his symptoms.  He has seen Dr. Jimmey Powell for sleep apnea consultation plan to do home sleep study testing tonight.  He is tolerating aspirin well without bruising or bleeding.  His blood pressure is a little elevated and today it is 188/72.  He started eating better and started losing  some weight as well.  He started a new injection for his diabetes regular follow-up with his physician.  He has no new complaints today    ROS:   14 system review of systems is positive for numbness, numbness, sleep apnea all other systems negative  PMH:  Past Medical History:  Diagnosis Date   Abdominal lymphadenopathy 06/21/2012   Borderline abdominal/pelvic adenopathy seen incidentally on CT scan 2011.  No change follow up 10/13   DM2 (diabetes mellitus, type 2) (HCC) 06/21/2012   HTN (hypertension), benign 06/21/2012   Left lumbar radiculopathy 06/21/2012   Leukocytosis 06/21/2012   High normal @ 10,000  41 poly, 46% lymphs stable over time 6// to 10/13 no anemia or thrombocytopenia   Migraine    Obesity    Sleep apnea    uses CPAP at night   Stroke [facility])    TIA in 04/2022    Social History:  Social History   Socioeconomic History   Marital status: Married    Spouse name: Not on file   Number of children: Not on file   Years of education: Not on file   Highest education level: Not on file  Occupational History   Not on file  Tobacco Use   Smoking status: Former    Types: Cigars   Smokeless tobacco: Never  Vaping Use   Vaping Use: Never used  Substance and Sexual Activity   Alcohol use: Yes    Comment: occ   Drug use: No   Sexual activity: Yes  Other Topics  Concern   Not on file  Social History Narrative   Caffeine soda every now and then.   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    Social Determinants of Health   Financial Resource Strain: Not on file  Food Insecurity: Not on file  Transportation Needs: No Transportation Needs (06/17/2022)   PRAPARE - Administrator, Civil Service (Medical): No    Lack of Transportation (Non-Medical): No  Physical Activity: Not on file  Stress: Not on file  Social Connections: Not on file  Intimate Partner Violence: Not on file    Medications:    Current Outpatient Medications on File Prior to Visit  Medication Sig Dispense Refill   amLODipine (NORVASC) 10 MG tablet Take 1 tablet (10 mg total) by mouth every morning. 90 tablet 1   Amoxicill-Clarithro-Lansopraz 500 & 500 & 30 MG THPK Take by mouth 2 (two) times daily.     aspirin EC 81 MG tablet Take 1 tablet (81 mg total) by mouth daily. Swallow whole. 30 tablet 12   atorvastatin (LIPITOR) 80 MG tablet TAKE 1 TABLET BY MOUTH EVERY DAY 30 tablet 3   losartan-hydrochlorothiazide (HYZAAR) 100-12.5 MG tablet Take 1 tablet by mouth daily. 60 tablet 2   metFORMIN (GLUCOPHAGE) 1000 MG tablet Take 1 tablet (1,000 mg total) by mouth 2 (two) times daily with a meal. 60 tablet 2   MOUNJARO 2.5 MG/0.5ML Pen INJECT 2.5 MG SUBCUTANEOUSLY WEEKLY 6 mL 1   spironolactone (ALDACTONE) 25 MG tablet Take 1 tablet (25 mg total) by mouth daily. 30 tablet 11   Vitamin D, Ergocalciferol, (DRISDOL) 1.25 MG (50000 UNIT) CAPS capsule Take 50,000 Units by mouth once a week.     No current facility-administered medications on file prior to visit.    Allergies:  No Known Allergies  Physical Exam General: Morbidly obese middle-aged African-American male, seated, in no evident distress Head: head normocephalic and atraumatic.  Neck: supple with no carotid or supraclavicular bruits Cardiovascular: regular rate and rhythm, no murmurs Musculoskeletal: no deformity Skin:  no rash/petichiae Vascular:  Normal pulses all extremities Vitals:   06/28/22 1448  BP: (!) 185/72  Pulse: 94   Neurologic Exam Mental Status: Awake and fully alert. Oriented to place and time. Recent and remote memory intact. Attention span, concentration and fund of knowledge appropriate. Mood and affect appropriate.  Cranial Nerves: Fundoscopic exam reveals sharp disc margins. Pupils equal, briskly reactive to light. Extraocular movements full without nystagmus. Visual fields full to confrontation. Hearing intact. Facial sensation intact.  Face, tongue, palate moves normally and symmetrically.  Motor: Normal bulk and tone. Normal strength in all tested extremity muscles.  Diminished fine finger movements on the left.  Orbits right over left upper extremities. Sensory.: intact to touch ,pinprick .position and vibratory sensation.  Coordination: Rapid alternating movements normal in all extremities. Finger-to-nose and heel-to-shin performed accurately bilaterally. Gait and Station: Arises from chair without difficulty. Stance is normal. Gait demonstrates normal stride length and balance . Able to heel, toe and tandem walk without difficulty.  Reflexes: 1+ and symmetric. Toes downgoing.   NIHSS  0 Modified Rankin  1   ASSESSMENT: 51 year old African-American male with right pontine lacunar stroke in September 2023 from small vessel disease.  Multiple vascular risk factors of obesity, diabetes, hypertension, hyperlipidemia, obstructive sleep apnea.     PLAN:I had a long d/w patient about his recent lacunar stroke, risk for recurrent  stroke/TIAs, personally independently reviewed imaging studies and stroke evaluation results and answered questions.Continue aspirin 81 mg daily  for secondary stroke prevention and maintain strict control of hypertension with blood pressure goal below 130/90, diabetes with hemoglobin A1c goal below 6.5% and lipids with LDL cholesterol goal below 70 mg/dL. I also advised the patient to eat a healthy diet with plenty of whole grains, cereals, fruits and vegetables, exercise regularly and maintain ideal body weight .I advised him to follow-up with Dr.  Frances Furbish for work-up for obstructive sleep apnea compliance with treatment .followup in the future with me in 6 months or call earlier if needed.Greater than 50% of time during this 35 minute visit was spent on counseling,explanation of diagnosis of lacunar stroke, planning of further management, discussion with patient and family and coordination of care Delia Heady, MD Note: This document was prepared with digital dictation and possible smart phrase technology. Any transcriptional errors that result from this process are unintentional

## 2022-06-28 NOTE — Patient Instructions (Signed)
I had a long d/w patient about his recent lacunar stroke, risk for recurrent stroke/TIAs, personally independently reviewed imaging studies and stroke evaluation results and answered questions.Continue aspirin 81 mg daily  for secondary stroke prevention and maintain strict control of hypertension with blood pressure goal below 130/90, diabetes with hemoglobin A1c goal below 6.5% and lipids with LDL cholesterol goal below 70 mg/dL. I also advised the patient to eat a healthy diet with plenty of whole grains, cereals, fruits and vegetables, exercise regularly and maintain ideal body weight .I advised him to follow-up with Dr.  Frances Furbish for work-up for obstructive sleep apnea compliance with treatment .followup in the future with me in 6 months or call earlier if needed.   Stroke Prevention Some medical conditions and behaviors can lead to a higher chance of having a stroke. You can help prevent a stroke by eating healthy, exercising, not smoking, and managing any medical conditions you have. Stroke is a leading cause of functional impairment. Primary prevention is particularly important because a majority of strokes are first-time events. Stroke changes the lives of not only those who experience a stroke but also their family and other caregivers. How can this condition affect me? A stroke is a medical emergency and should be treated right away. A stroke can lead to brain damage and can sometimes be life-threatening. If a person gets medical treatment right away, there is a better chance of surviving and recovering from a stroke. What can increase my risk? The following medical conditions may increase your risk of a stroke: Cardiovascular disease. High blood pressure (hypertension). Diabetes. High cholesterol. Sickle cell disease. Blood clotting disorders (hypercoagulable state). Obesity. Sleep disorders (obstructive sleep apnea). Other risk factors include: Being older than age 64. Having a history  of blood clots, stroke, or mini-stroke (transient ischemic attack, TIA). Genetic factors, such as race, ethnicity, or a family history of stroke. Smoking cigarettes or using other tobacco products. Taking birth control pills, especially if you also use tobacco. Heavy use of alcohol or drugs, especially cocaine and methamphetamine. Physical inactivity. What actions can I take to prevent this? Manage your health conditions High cholesterol levels. Eating a healthy diet is important for preventing high cholesterol. If cholesterol cannot be managed through diet alone, you may need to take medicines. Take any prescribed medicines to control your cholesterol as told by your health care provider. Hypertension. To reduce your risk of stroke, try to keep your blood pressure below 130/80. Eating a healthy diet and exercising regularly are important for controlling blood pressure. If these steps are not enough to manage your blood pressure, you may need to take medicines. Take any prescribed medicines to control hypertension as told by your health care provider. Ask your health care provider if you should monitor your blood pressure at home. Have your blood pressure checked every year, even if your blood pressure is normal. Blood pressure increases with age and some medical conditions. Diabetes. Eating a healthy diet and exercising regularly are important parts of managing your blood sugar (glucose). If your blood sugar cannot be managed through diet and exercise, you may need to take medicines. Take any prescribed medicines to control your diabetes as told by your health care provider. Get evaluated for obstructive sleep apnea. Talk to your health care provider about getting a sleep evaluation if you snore a lot or have excessive sleepiness. Make sure that any other medical conditions you have, such as atrial fibrillation or atherosclerosis, are managed. Nutrition Follow instructions from your  health  care provider about what to eat or drink to help manage your health condition. These instructions may include: Reducing your daily calorie intake. Limiting how much salt (sodium) you use to 1,500 milligrams (mg) each day. Using only healthy fats for cooking, such as olive oil, canola oil, or sunflower oil. Eating healthy foods. You can do this by: Choosing foods that are high in fiber, such as whole grains, and fresh fruits and vegetables. Eating at least 5 servings of fruits and vegetables a day. Try to fill one-half of your plate with fruits and vegetables at each meal. Choosing lean protein foods, such as lean cuts of meat, poultry without skin, fish, tofu, beans, and nuts. Eating low-fat dairy products. Avoiding foods that are high in sodium. This can help lower blood pressure. Avoiding foods that have saturated fat, trans fat, and cholesterol. This can help prevent high cholesterol. Avoiding processed and prepared foods. Counting your daily carbohydrate intake.  Lifestyle If you drink alcohol: Limit how much you have to: 0-1 drink a day for women who are not pregnant. 0-2 drinks a day for men. Know how much alcohol is in your drink. In the [place] one drink equals one 12 oz bottle of beer (316mL), one 5 oz glass of wine (152mL), or one 1 oz glass of hard liquor (57mL). Do not use any products that contain nicotine or tobacco. These products include cigarettes, chewing tobacco, and vaping devices, such as e-cigarettes. If you need help quitting, ask your health care provider. Avoid secondhand smoke. Do not use drugs. Activity  Try to stay at a healthy weight. Get at least 30 minutes of exercise on most days, such as: Fast walking. Biking. Swimming. Medicines Take over-the-counter and prescription medicines only as told by your health care provider. Aspirin or blood thinners (antiplatelets or anticoagulants) may be recommended to reduce your risk of forming blood clots that can lead  to stroke. Avoid taking birth control pills. Talk to your health care provider about the risks of taking birth control pills if: You are over 51 years old. You smoke. You get very bad headaches. You have had a blood clot. Where to find more information American Stroke Association: www.strokeassociation.org Get help right away if: You or a loved one has any symptoms of a stroke. "BE FAST" is an easy way to remember the main warning signs of a stroke: B - Balance. Signs are dizziness, sudden trouble walking, or loss of balance. E - Eyes. Signs are trouble seeing or a sudden change in vision. F - Face. Signs are sudden weakness or numbness of the face, or the face or eyelid drooping on one side. A - Arms. Signs are weakness or numbness in an arm. This happens suddenly and usually on one side of the body. S - Speech. Signs are sudden trouble speaking, slurred speech, or trouble understanding what people say. T - Time. Time to call emergency services. Write down what time symptoms started. You or a loved one has other signs of a stroke, such as: A sudden, severe headache with no known cause. Nausea or vomiting. Seizure. These symptoms may represent a serious problem that is an emergency. Do not wait to see if the symptoms will go away. Get medical help right away. Call your local emergency services (911 in the [place] Do not drive yourself to the hospital. Summary You can help to prevent a stroke by eating healthy, exercising, not smoking, limiting alcohol intake, and managing any medical conditions you may  have. Do not use any products that contain nicotine or tobacco. These include cigarettes, chewing tobacco, and vaping devices, such as e-cigarettes. If you need help quitting, ask your health care provider. Remember "BE FAST" for warning signs of a stroke. Get help right away if you or a loved one has any of these signs. This information is not intended to replace advice given to you by your  health care provider. Make sure you discuss any questions you have with your health care provider. Document Revised: 03/03/2020 Document Reviewed: 03/03/2020 Elsevier Patient Education  [place].

## 2022-06-30 ENCOUNTER — Encounter: Payer: Self-pay | Admitting: Student

## 2022-06-30 ENCOUNTER — Other Ambulatory Visit: Payer: Self-pay

## 2022-06-30 ENCOUNTER — Ambulatory Visit (INDEPENDENT_AMBULATORY_CARE_PROVIDER_SITE_OTHER): Payer: BC Managed Care – PPO | Admitting: Student

## 2022-06-30 VITALS — BP 140/60 | HR 88 | Temp 97.8°F | Ht 71.0 in | Wt 349.8 lb

## 2022-06-30 DIAGNOSIS — Z7984 Long term (current) use of oral hypoglycemic drugs: Secondary | ICD-10-CM

## 2022-06-30 DIAGNOSIS — Z9189 Other specified personal risk factors, not elsewhere classified: Secondary | ICD-10-CM

## 2022-06-30 DIAGNOSIS — I10 Essential (primary) hypertension: Secondary | ICD-10-CM | POA: Diagnosis not present

## 2022-06-30 DIAGNOSIS — G4733 Obstructive sleep apnea (adult) (pediatric): Secondary | ICD-10-CM | POA: Diagnosis not present

## 2022-06-30 DIAGNOSIS — Z7985 Long-term (current) use of injectable non-insulin antidiabetic drugs: Secondary | ICD-10-CM

## 2022-06-30 DIAGNOSIS — E1165 Type 2 diabetes mellitus with hyperglycemia: Secondary | ICD-10-CM

## 2022-06-30 DIAGNOSIS — M1A9XX0 Chronic gout, unspecified, without tophus (tophi): Secondary | ICD-10-CM

## 2022-06-30 DIAGNOSIS — Z87891 Personal history of nicotine dependence: Secondary | ICD-10-CM

## 2022-06-30 DIAGNOSIS — I6381 Other cerebral infarction due to occlusion or stenosis of small artery: Secondary | ICD-10-CM

## 2022-06-30 LAB — POCT GLYCOSYLATED HEMOGLOBIN (HGB A1C): Hemoglobin A1C: 6.6 % — AB (ref 4.0–5.6)

## 2022-06-30 LAB — GLUCOSE, CAPILLARY: Glucose-Capillary: 92 mg/dL (ref 70–99)

## 2022-06-30 NOTE — Progress Notes (Unsigned)
CC: surgical clearance  HPI:  Mr.Darral A Comas is a 51 y.o. person with medical history as below presenting to [facility] for surgical clearance for bariatric surgery.   Please see problem-based list for further details, assessments, and plans.  Past Medical History:  Diagnosis Date   Abdominal lymphadenopathy 06/21/2012   Borderline abdominal/pelvic adenopathy seen incidentally on CT scan 2011.  No change follow up 10/13   DM2 (diabetes mellitus, type 2) (HCC) 06/21/2012   HTN (hypertension), benign 06/21/2012   Left lumbar radiculopathy 06/21/2012   Leukocytosis 06/21/2012   High normal @ 10,000  41 poly, 46% lymphs stable over time 6// to 10/13 no anemia or thrombocytopenia   Migraine    Obesity    Sleep apnea    uses CPAP at night   Stroke [facility])    TIA in 04/2022   Review of Systems:  As per HPI  Physical Exam:  Vitals:   06/30/22 0931 06/30/22 0944  BP: (!) 161/90 (!) 140/60  Pulse: 83 88  Temp: 97.8 F (36.6 C)   TempSrc: Oral   SpO2: 100%   Weight: (!) 349 lb 12.8 oz (158.7 kg)   Height: 5\' 11"  (1.803 m)    General: Resting comfortably in no acute distress CV: Regular rate, rhythm. No murmurs appreciated. Distal pulses 2+ bilaterally. Pulm: Normal work of breathing on room air. Clear to auscultation bilaterally. No wheezing or rhonchi. GI: Abdomen soft, non-tender, non-distended. Normoactive bowel sounds. MSK: Normal bulk, tone. No peripheral edema noted. Bilateral feet without open wounds, warmth, or redness throughout.  Skin: Warm, dry. No rashes or lesions appreciated. Neuro: Awake, alert, conversing appropriately. Grossly non-focal. Psych: Normal mood, affect, speech.  Assessment & Plan:   HTN (hypertension), benign BP Readings from Last 3 Encounters:  06/30/22 (!) 140/60  06/28/22 (!) 185/72  06/24/22 (!) 162/103   Blood pressure elevated today 140/60, although patient did not take spironolactone this morning. On his current regimen he denies any side  effects and reports compliance. I am hesitant to adjust his medications without knowing his pressures on his full medications. We will continue with his current regimen and have him follow-up after taking all his medications.  - Losartan-hydrochlorothiazide 100-12.5mg  daily - Would increase HCTZ at next visit if additional antihypertensive effect is needed - Continue spironolactone 25mg  daily - Continue amlodipine 10mg  daily - BMP today  CVA (cerebral vascular accident) [facility]) Patient denies any further symptoms since his discharge. He was recently seen by neurology, suggestion for loop recorder for possible Afib given recent stroke and OSA. Mr. Nasca denies chest pain, dyspnea, palpitations, dizziness, syncope. We will place this referral today.  - Cardiology referral for loop recorder  OSA (obstructive sleep apnea) Patient reports he has been using his wife's CPAP for many years, but reports it is time for him to get his own machine. He underwent a home sleep study two nights ago. He is planning to follow-up with Dr. for this.  Type 2 diabetes mellitus with hyperglycemia (HCC) A1c 6.6% today from 7.7% at last check. Reports compliance with metformin, denies any GI side effects. In addition, it appears he has lost about 12lb since his last official visit. He denies numbness, pain in his extremities. We will plan to continue with his current regimen.  - Continue metformin 1000mg  twice daily - Tirzepatide 5mg  every week - Urine microalbumin/creatinine today - A1c in three monhts   Morbid obesity [facility]) Mr. Raudenbush is presenting today for surgical clearance for bariatric surgery.  He reports this was initially scheduled next month but has been delayed as he is trying to obtain his new CPAP. His blood pressure is still slightly elevated today, although he did not take all of his anti-hypertensives. In addition, his diabetes is well-controlled with A1c 6.6%. RCRI class II, DASI 34.7 (7  METs). Once patient is cleared by neurology and he has the proper CPAP machine he will be cleared for his bariatric surgery.   Gout Patient does have a significant history of gout, noted by previous synovial fluid from 11/2020. Patient reports he currently does not have any pain but occasionally this flares up. I discussed with Mr. Cranston to let us know if he is having a flare and we can prescribe colchicine.   Patient discussed with Dr.  Evelena Leyden, MD Internal Medicine PGY-3 Pager: [phone]

## 2022-06-30 NOTE — Patient Instructions (Signed)
Mr.Lanorris A Wandrey, it was a pleasure seeing you today!  Today we discussed: - Let's keep your blood pressure medications the same, please take the spironolactone in addition to the amlodipine and losartan.   - I have placed a referral to cardiology for a monitor.   I have ordered the following labs today:   Lab Orders         Microalbumin / Creatinine Urine Ratio         Glucose, capillary         POC Hbg A1C      Referrals ordered today:   Referral Orders         Ambulatory referral to Cardiology     Follow-up: 3 months   Please make sure to arrive 15 minutes prior to your next appointment. If you arrive late, you may be asked to reschedule.   We look forward to seeing you next time. Please call our clinic at [phone] if you have any questions or concerns. The best time to call is Monday-Friday from 9am-4pm, but there is someone available 24/7. If after hours or the weekend, call the main hospital number and ask for the Internal Medicine Resident On-Call. If you need medication refills, please notify your pharmacy one week in advance and they will send [place] a request.  Thank you for letting us take part in your care. Wishing you the best!  Thank you, Evlyn Kanner, MD

## 2022-07-01 LAB — BMP8+ANION GAP
Anion Gap: 15 mmol/L (ref 10.0–18.0)
BUN/Creatinine Ratio: 10 (ref 9–20)
BUN: 12 mg/dL (ref 6–24)
CO2: 25 mmol/L (ref 20–29)
Calcium: 9.5 mg/dL (ref 8.7–10.2)
Chloride: 100 mmol/L (ref 96–106)
Creatinine, Ser: 1.17 mg/dL (ref 0.76–1.27)
Glucose: 94 mg/dL (ref 70–99)
Potassium: 3.7 mmol/L (ref 3.5–5.2)
Sodium: 140 mmol/L (ref 134–144)
eGFR: 75 mL/min/{1.73_m2} (ref >59)

## 2022-07-01 LAB — MICROALBUMIN / CREATININE URINE RATIO
Creatinine, Urine: 143.9 mg/dL
Microalb/Creat Ratio: 8 mg/g creat (ref 0–29)
Microalbumin, Urine: 12.2 ug/mL

## 2022-07-01 NOTE — Assessment & Plan Note (Signed)
Travis Powell is presenting today for surgical clearance for bariatric surgery. He reports this was initially scheduled next month but has been delayed as he is trying to obtain his new CPAP. His blood pressure is still slightly elevated today, although he did not take all of his anti-hypertensives. In addition, his diabetes is well-controlled with A1c 6.6%. RCRI class II, DASI 34.7 (7 METs). Once patient is cleared by neurology and he has the proper CPAP machine he will be cleared for his bariatric surgery.

## 2022-07-01 NOTE — Assessment & Plan Note (Signed)
A1c 6.6% today from 7.7% at last check. Reports compliance with metformin, denies any GI side effects. In addition, it appears he has lost about 12lb since his last official visit. He denies numbness, pain in his extremities. We will plan to continue with his current regimen.  - Continue metformin 1000mg  twice daily - Tirzepatide 5mg  every week - Urine microalbumin/creatinine today - A1c in three monhts

## 2022-07-01 NOTE — Assessment & Plan Note (Signed)
BP Readings from Last 3 Encounters:  06/30/22 (!) 140/60  06/28/22 (!) 185/72  06/24/22 (!) 162/103   Blood pressure elevated today 140/60, although patient did not take spironolactone this morning. On his current regimen he denies any side effects and reports compliance. I am hesitant to adjust his medications without knowing his pressures on his full medications. We will continue with his current regimen and have him follow-up after taking all his medications.  - Losartan-hydrochlorothiazide 100-12.5mg  daily - Would increase HCTZ at next visit if additional antihypertensive effect is needed - Continue spironolactone 25mg  daily - Continue amlodipine 10mg  daily - BMP today

## 2022-07-01 NOTE — Assessment & Plan Note (Signed)
Patient reports he has been using his wife's CPAP for many years, but reports it is time for him to get his own machine. He underwent a home sleep study two nights ago. He is planning to follow-up with Dr. Frances Furbish for this.

## 2022-07-01 NOTE — Assessment & Plan Note (Signed)
Patient does have a significant history of gout, noted by previous synovial fluid from 11/2020. Patient reports he currently does not have any pain but occasionally this flares up. I discussed with Travis Powell to let us know if he is having a flare and we can prescribe colchicine.

## 2022-07-01 NOTE — Assessment & Plan Note (Signed)
Patient denies any further symptoms since his discharge. He was recently seen by neurology, suggestion for loop recorder for possible Afib given recent stroke and OSA. Travis Powell denies chest pain, dyspnea, palpitations, dizziness, syncope. We will place this referral today.  - Cardiology referral for loop recorder

## 2022-07-07 NOTE — Addendum Note (Signed)
Addended by: [name] on: 07/07/2022 01:42 PM   Modules accepted: Orders

## 2022-07-07 NOTE — Procedures (Signed)
Travis Powell  HOME SLEEP TEST (Watch PAT) REPORT - Mail out device  STUDY DATE: 06/28/2022  DOB: [birth year]  MRN: [id number]  ORDERING CLINICIAN: [name], MD, PhD   REFERRING CLINICIAN: Dr. August Saucer  CLINICAL INFORMATION/HISTORY: 51 year old male with a history of diabetes, hypertension, lumbar radiculopathy, history of TIA/stroke, abdominal lymphadenopathy, migraine headaches, and morbid obesity with a BMI of over 45, who was previously diagnosed with obstructive sleep apnea and placed on positive airway pressure treatment. His PAP machine broke a few months ago; he has been using his wife's machine.     Epworth sleepiness score: 7/24.  BMI: 50 kg/m  FINDINGS:   Sleep Summary:   Total Recording Time (hours, min): 9 hours, 59 min  Total Sleep Time (hours, min):  8 hours, 51 min  Percent REM (%):    13.6%   Respiratory Indices:   Calculated pAHI (per hour):  69.8/hour         REM pAHI:    58.6/hour       NREM pAHI: 71.2/hour  Central pAHI: 13/hour  Oxygen Saturation Statistics:    Oxygen Saturation (%) Mean: 93%   Minimum oxygen saturation (%):                 74%   O2 Saturation Range (%): 74- 99%    O2 Saturation (minutes) <=88%: 25.3 min  Pulse Rate Statistics:   Pulse Mean (bpm):    71/min    Pulse Range (49-141/min)   IMPRESSION: OSA (obstructive sleep apnea), severe Central Sleep Apnea  Nocturnal Hypoxemia  RECOMMENDATION:  This home sleep test demonstrates severe obstructive sleep apnea with a total AHI of 69.8/hour and O2 nadir of 74%.  There was a mild or central component as well.  Time below or at 88% saturation was over 20 minutes for the night, indicating nocturnal hypoxemia.  Snoring was in the moderate to loud range fairly consistently throughout the night, mostly in the mild range.  Ongoing treatment with positive airway pressure is highly recommended. I will write for a new AutoPap machine for the patient. A laboratory  attended titration study can be considered in the future for optimization of his treatment and better tolerance of therapy.  Alternative treatment options are limited secondary to the severity of the patient's sleep disordered breathing, but may include surgical treatment with Inspire, hypoglossal nerve stimulator, in carefully selected candidates meeting criteria.  Concomitant and aggressive weight loss is recommended, as clinically appropriate. Please note, that untreated obstructive sleep apnea may carry additional perioperative morbidity. Patients with significant obstructive sleep apnea should receive perioperative PAP therapy and the surgeons and particularly the anesthesiologist should be informed of the diagnosis and the severity of the sleep disordered breathing. The patient should be cautioned not to drive, work at heights, or operate dangerous or heavy equipment when tired or sleepy. Review and reiteration of good sleep hygiene measures should be pursued with any patient. Other causes of the patient's symptoms, including circadian rhythm disturbances, an underlying mood disorder, medication effect and/or an underlying medical problem cannot be ruled out based on this test. Clinical correlation is recommended.  The patient and his referring provider will be notified of the test results. The patient will be seen in follow up in sleep clinic at [facility].  I certify that I have reviewed the raw data recording prior to the issuance of this report in accordance with the standards of the American Academy of Sleep Medicine (AASM).  INTERPRETING PHYSICIAN:   Havard Radigan  Frances Furbish, MD, PhD  Board Certified in Neurology and Sleep Medicine  [facility] [address], [address] [place], [place] [address] [phone]

## 2022-07-15 NOTE — Progress Notes (Signed)
Internal Medicine Clinic Attending ? ?Case discussed with Dr. Braswell  At the time of the visit.  We reviewed the resident?s history and exam and pertinent patient test results.  I agree with the assessment, diagnosis, and plan of care documented in the resident?s note.  ?

## 2022-07-19 ENCOUNTER — Telehealth: Payer: Self-pay | Admitting: *Deleted

## 2022-07-19 NOTE — Telephone Encounter (Signed)
I called and LMVM for pt to return call about HST results.

## 2022-07-19 NOTE — Telephone Encounter (Signed)
-----   Message from [name], MD sent at 07/07/2022  1:42 PM EST ----- Patient referred by PCP for reevaluation of his sleep apnea.  I saw him on 05/11/2022.  His own CPAP machine broke and he has been using his wife's machine.  He had a home sleep test on 06/28/2022.    Please call and notify the patient that the recent home sleep test showed obstructive sleep apnea in the severe range. I recommend treatment for this in the form of autoPAP, which means, that we don't have to bring him in for a sleep study with CPAP, but will let him start using a so called autoPAP machine at home, through a DME company (of his choice, or as per insurance requirement). The DME representative will fit the patient with a mask of choice, educate him on how to use the machine, how to put the mask on, etc. I have placed an order in the chart. Please send the order to a local DME, talk to patient, send report to referring MD. Please also reinforce the need for compliance with treatment. We will need a FU in sleep clinic for 10 weeks post-PAP set up, please arrange that with me or one of our NPs. Thanks,   [name], MD, PhD [facility])

## 2022-07-19 NOTE — Telephone Encounter (Signed)
I called pt. I advised pt that Dr. Frances Furbish reviewed their sleep study results and found that pt has OSA In severe range.. Dr. Frances Furbish recommends that pt start autopap. I reviewed PAP compliance expectations with the pt. Pt is agreeable to starting an auto-PAP. I advised pt that an order will be sent to a DME, [facility], and they will call the pt within about one week after they file with the pt's insurance. they will show the pt how to use the machine, fit for masks, and troubleshoot the auto-PAP if needed. A follow up appt was made for insurance purposes with [place] on 10-06-2022 at 1245. Pt verbalized understanding to arrive 15 minutes early and bring their auto-PAP. A letter with all of this information in it will be mailed to the pt as a reminder. I verified with the pt that the address we have on file is correct. Pt verbalized understanding of results. Pt had no questions at this time but was encouraged to call back if questions arise. I have sent the order to [facility] and have received confirmation that they have received the order.

## 2022-07-19 NOTE — Telephone Encounter (Signed)
-----   Message from Saima Athar, MD sent at 07/07/2022  1:42 PM EST ----- Patient referred by PCP for reevaluation of his sleep apnea.  I saw him on 05/11/2022.  His own CPAP machine broke and he has been using his wife's machine.  He had a home sleep test on 06/28/2022.    Please call and notify the patient that the recent home sleep test showed obstructive sleep apnea in the severe range. I recommend treatment for this in the form of autoPAP, which means, that we don't have to bring him in for a sleep study with CPAP, but will let him start using a so called autoPAP machine at home, through a DME company (of his choice, or as per insurance requirement). The DME representative will fit the patient with a mask of choice, educate him on how to use the machine, how to put the mask on, etc. I have placed an order in the chart. Please send the order to a local DME, talk to patient, send report to referring MD. Please also reinforce the need for compliance with treatment. We will need a FU in sleep clinic for 10 weeks post-PAP set up, please arrange that with me or one of our NPs. Thanks,   Saima Athar, MD, PhD [facility] ([facility])     

## 2022-07-22 ENCOUNTER — Encounter: Payer: Self-pay | Admitting: Student

## 2022-07-23 ENCOUNTER — Other Ambulatory Visit: Payer: Self-pay | Admitting: Student

## 2022-07-23 DIAGNOSIS — E1165 Type 2 diabetes mellitus with hyperglycemia: Secondary | ICD-10-CM

## 2022-07-28 ENCOUNTER — Encounter: Payer: Self-pay | Admitting: Neurology

## 2022-07-28 NOTE — Telephone Encounter (Signed)
Spoke with [facility]. They were unable to locate the order. They will rush the processing. I have faxed the referral again. Received a receipt of confirmation.

## 2022-08-06 DIAGNOSIS — G4733 Obstructive sleep apnea (adult) (pediatric): Secondary | ICD-10-CM | POA: Diagnosis not present

## 2022-08-12 ENCOUNTER — Telehealth: Payer: Self-pay | Admitting: Neurology

## 2022-08-12 NOTE — Telephone Encounter (Signed)
Thanks

## 2022-08-12 NOTE — Telephone Encounter (Signed)
DME: [facility] Set up date: 08/06/22 Unit: Airsense 10 Pt scheduled on 10/06/22 @12 :45pm

## 2022-08-19 DIAGNOSIS — E113293 Type 2 diabetes mellitus with mild nonproliferative diabetic retinopathy without macular edema, bilateral: Secondary | ICD-10-CM | POA: Diagnosis not present

## 2022-08-19 DIAGNOSIS — H31092 Other chorioretinal scars, left eye: Secondary | ICD-10-CM | POA: Diagnosis not present

## 2022-08-19 DIAGNOSIS — H524 Presbyopia: Secondary | ICD-10-CM | POA: Diagnosis not present

## 2022-08-19 DIAGNOSIS — H35033 Hypertensive retinopathy, bilateral: Secondary | ICD-10-CM | POA: Diagnosis not present

## 2022-08-19 DIAGNOSIS — H04123 Dry eye syndrome of bilateral lacrimal glands: Secondary | ICD-10-CM | POA: Diagnosis not present

## 2022-09-04 ENCOUNTER — Other Ambulatory Visit: Payer: Self-pay | Admitting: Student

## 2022-09-04 DIAGNOSIS — I10 Essential (primary) hypertension: Secondary | ICD-10-CM

## 2022-09-06 DIAGNOSIS — G4733 Obstructive sleep apnea (adult) (pediatric): Secondary | ICD-10-CM | POA: Diagnosis not present

## 2022-09-29 ENCOUNTER — Telehealth: Payer: Self-pay | Admitting: Skilled Nursing Facility1

## 2022-09-29 NOTE — Telephone Encounter (Signed)
Dietitian called pt to assess their understanding of the pre-op nutrition recommendations through the teach back method to ensure the pts knowledge readiness in preparation for surgery.    LVM

## 2022-10-05 ENCOUNTER — Ambulatory Visit: Payer: Self-pay | Admitting: General Surgery

## 2022-10-05 DIAGNOSIS — E1165 Type 2 diabetes mellitus with hyperglycemia: Secondary | ICD-10-CM

## 2022-10-05 NOTE — Progress Notes (Signed)
Surgery orders requested via Epic inbox. °

## 2022-10-05 NOTE — Progress Notes (Unsigned)
[facility] [address] [place]. [place] [address] [phone]       OFFICE FOLLOW UP NOTE  [name] Date of Birth:  February 03, 1971 Medical Record Number:  [id number]   Reason for visit: Initial CPAP follow-up    SUBJECTIVE:   CHIEF COMPLAINT:  No chief complaint on file.   HPI:   Travis Powell is a 52 y.o. male who is being seen for sleep apnea.  He is initially seen by Dr. Rexene Alberts on 05/11/2022 for prior diagnosis of OSA and needing new CPAP machine.  Completed HST 06/16/2022 which showed severe OSA with total AHI of 69.8/h and O2 nadir 74% indicating nocturnal hypoxemia.  Recommended restart of AutoPap which was started on 08/06/2022.   Interval history: Patient returns for initial CPAP compliance visit.        ROS:   14 system review of systems performed and negative with exception of those listed in HPI  PMH:  Past Medical History:  Diagnosis Date   Abdominal lymphadenopathy 06/21/2012   Borderline abdominal/pelvic adenopathy seen incidentally on CT scan 2011.  No change follow up 10/13   DM2 (diabetes mellitus, type 2) ([place]) 06/21/2012   HTN (hypertension), benign 06/21/2012   Left lumbar radiculopathy 06/21/2012   Leukocytosis 06/21/2012   High normal @ 10,000  41 poly, 46% lymphs stable over time 6// to 10/13 no anemia or thrombocytopenia   Migraine    Obesity    Sleep apnea    uses CPAP at night   Stroke [facility])    TIA in 04/2022    PSH:  Past Surgical History:  Procedure Laterality Date   CARPAL TUNNEL RELEASE Left     Social History:  Social History   Socioeconomic History   Marital status: Married    Spouse name: Not on file   Number of children: Not on file   Years of education: Not on file   Highest education level: Not on file  Occupational History   Not on file  Tobacco Use   Smoking status: Former    Types: Cigars   Smokeless tobacco: Never  Vaping Use   Vaping Use: Never used  Substance and Sexual  Activity   Alcohol use: Yes    Comment: occ   Drug use: No   Sexual activity: Yes  Other Topics Concern   Not on file  Social History Narrative   Caffeine soda every now and then.   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    Social Determinants of Health   Financial Resource Strain: Low Risk  (06/30/2022)   Overall Financial Resource Strain (CARDIA)    Difficulty of Paying Living Expenses: Not hard at all  Food Insecurity: No Food Insecurity (06/30/2022)   Hunger Vital Sign    Worried About Running Out of Food in the Last Year: Never true    Ran Out of Food in the Last Year: Never true  Transportation Needs: No Transportation Needs (06/30/2022)   PRAPARE - Hydrologist (Medical): No    Lack of Transportation (Non-Medical): No  Physical Activity: Sufficiently Active (06/30/2022)   Exercise Vital Sign    Days of Exercise per Week: 5 days    Minutes of Exercise per Session: 60 min  Stress: No Stress Concern Present (06/30/2022)   [place]    Feeling of Stress :  Not at all  Social Connections: Moderately Isolated (06/30/2022)   Social Connection and Isolation Panel [NHANES]    Frequency of Communication with Friends and Family: Three times a week    Frequency of Social Gatherings with Friends and Family: Once a week    Attends Religious Services: Never    Marine scientist or Organizations: No    Attends Music therapist: Not on file    Marital Status: Married  Human resources officer Violence: Not At Risk (06/30/2022)   Humiliation, Afraid, Rape, and Kick questionnaire    Fear of Current or Ex-Partner: No    Emotionally Abused: No    Physically Abused: No    Sexually Abused: No    Family History:  Family History  Problem Relation Age of Onset   Hypertension Mother    Diabetes Mother    Hypertension  Father    Deep vein thrombosis Father    Diabetes Brother    Hypertension Brother    CAD Other    Stroke Other     Medications:   Current Outpatient Medications on File Prior to Visit  Medication Sig Dispense Refill   amLODipine (NORVASC) 10 MG tablet TAKE 1 TABLET (10 MG TOTAL) BY MOUTH EVERY MORNING. 90 tablet 1   Amoxicill-Clarithro-Lansopraz 500 & 500 & 30 MG THPK Take by mouth 2 (two) times daily.     aspirin EC 81 MG tablet Take 1 tablet (81 mg total) by mouth daily. Swallow whole. 30 tablet 12   atorvastatin (LIPITOR) 80 MG tablet TAKE 1 TABLET BY MOUTH EVERY DAY 30 tablet 3   losartan-hydrochlorothiazide (HYZAAR) 100-12.5 MG tablet Take 1 tablet by mouth daily. 60 tablet 2   metFORMIN (GLUCOPHAGE) 1000 MG tablet TAKE 1 TABLET (1,000 MG TOTAL) BY MOUTH TWICE A DAY WITH FOOD 180 tablet 2   MOUNJARO 2.5 MG/0.5ML Pen INJECT 2.5 MG SUBCUTANEOUSLY WEEKLY 6 mL 1   spironolactone (ALDACTONE) 25 MG tablet Take 1 tablet (25 mg total) by mouth daily. 30 tablet 11   Vitamin D, Ergocalciferol, (DRISDOL) 1.25 MG (50000 UNIT) CAPS capsule Take 50,000 Units by mouth once a week.     No current facility-administered medications on file prior to visit.    Allergies:  No Known Allergies    OBJECTIVE:  Physical Exam  There were no vitals filed for this visit. There is no height or weight on file to calculate BMI. No results found.   General: well developed, well nourished, seated, in no evident distress Head: head normocephalic and atraumatic.   Neck: supple with no carotid or supraclavicular bruits Cardiovascular: regular rate and rhythm, no murmurs Musculoskeletal: no deformity Skin:  no rash/petichiae Vascular:  Normal pulses all extremities   Neurologic Exam Mental Status: Awake and fully alert. Oriented to place and time. Recent and remote memory intact. Attention span, concentration and fund of knowledge appropriate. Mood and affect appropriate.  Cranial Nerves: Pupils equal,  briskly reactive to light. Extraocular movements full without nystagmus. Visual fields full to confrontation. Hearing intact. Facial sensation intact. Face, tongue, palate moves normally and symmetrically.  Motor: Normal bulk and tone. Normal strength in all tested extremity muscles Sensory.: intact to touch , pinprick , position and vibratory sensation.  Coordination: Rapid alternating movements normal in all extremities. Finger-to-nose and heel-to-shin performed accurately bilaterally. Gait and Station: Arises from chair without difficulty. Stance is normal. Gait demonstrates normal stride length and balance without use of AD. Tandem walk and heel toe without difficulty.  Reflexes: 1+  and symmetric. Toes downgoing.         ASSESSMENT/PLAN: TELFORD GIRDNER is a 52 y.o. year old male with longstanding history of sleep apnea and restart of AutoPap in 07/2022.    OSA on CPAP : Compliance report shows satisfactory usage with optimal residual AHI.  Discussed continued nightly usage with ensuring greater than 4 hours nightly for optimal benefit and per insurance purposes.  Continue to follow with DME company for any needed supplies or CPAP related concerns     Follow up in *** or call earlier if needed   CC:  PCP: [name], MD    I spent *** minutes of face-to-face and non-face-to-face time with patient.  This included previsit chart review, lab review, study review, order entry, electronic health record documentation, patient education regarding diagnosis of sleep apnea with review and discussion of compliance report and answered all other questions to patient's satisfaction   Frann Rider, [facility]  [facility] [address] [place], [place] [address]  Phone [phone] Fax [phone] Note: This document was prepared with digital dictation and possible smart phrase technology. Any transcriptional errors that result from this process  are unintentional.

## 2022-10-06 ENCOUNTER — Ambulatory Visit: Payer: BC Managed Care – PPO | Admitting: Adult Health

## 2022-10-06 ENCOUNTER — Encounter: Payer: Self-pay | Admitting: Adult Health

## 2022-10-06 VITALS — BP 175/95 | HR 88 | Ht 71.0 in | Wt 355.0 lb

## 2022-10-06 DIAGNOSIS — G4733 Obstructive sleep apnea (adult) (pediatric): Secondary | ICD-10-CM

## 2022-10-06 NOTE — Patient Instructions (Addendum)
Your Plan:  Continue nightly use of CPAP for sleep apnea management  Continue to follow with your DME company for any needed supplies or CPAP related concerns      Follow up in 1 year or call earlier if needed     Thank you for coming to see [place] at [facility]. I hope we have been able to provide you high quality care today.  You may receive a patient satisfaction survey over the next few weeks. We would appreciate your feedback and comments so that we may continue to improve ourselves and the health of our patients.

## 2022-10-07 ENCOUNTER — Telehealth: Payer: Self-pay | Admitting: Skilled Nursing Facility1

## 2022-10-07 ENCOUNTER — Telehealth (HOSPITAL_COMMUNITY): Payer: Self-pay | Admitting: *Deleted

## 2022-10-07 DIAGNOSIS — M1710 Unilateral primary osteoarthritis, unspecified knee: Secondary | ICD-10-CM | POA: Diagnosis not present

## 2022-10-07 DIAGNOSIS — G4733 Obstructive sleep apnea (adult) (pediatric): Secondary | ICD-10-CM | POA: Diagnosis not present

## 2022-10-07 DIAGNOSIS — E785 Hyperlipidemia, unspecified: Secondary | ICD-10-CM | POA: Diagnosis not present

## 2022-10-07 NOTE — Telephone Encounter (Signed)
Attempt was made to touch base with pt and refresh edu. prior to surgery

## 2022-10-07 NOTE — Telephone Encounter (Signed)
Dietitian called pt to assess their understanding of the pre-op nutrition recommendations through the teach back method to ensure the pts knowledge readiness in preparation for surgery.     LVM

## 2022-10-08 DIAGNOSIS — E7800 Pure hypercholesterolemia, unspecified: Secondary | ICD-10-CM | POA: Diagnosis not present

## 2022-10-08 DIAGNOSIS — Z713 Dietary counseling and surveillance: Secondary | ICD-10-CM | POA: Diagnosis not present

## 2022-10-08 DIAGNOSIS — E781 Pure hyperglyceridemia: Secondary | ICD-10-CM | POA: Diagnosis not present

## 2022-10-08 DIAGNOSIS — I10 Essential (primary) hypertension: Secondary | ICD-10-CM | POA: Diagnosis not present

## 2022-10-11 ENCOUNTER — Telehealth: Payer: Self-pay | Admitting: Skilled Nursing Facility1

## 2022-10-11 NOTE — Telephone Encounter (Signed)
Returned pts call.  Dietitian engaged in a comprehensive review of the pre-op and post op nutrition education as well as constipation protocol.  Sent PDF pre-op nutrition handout via email and went through the material with pt and his wife within the context of his current needs and comorbidities focusing on key points. With this conversation and the inclusion of pts wife pt was able to comprehend his current diet phase and 2 weeks after surgery as represented by the teach back method.

## 2022-10-11 NOTE — Progress Notes (Signed)
PCP - Lennice Sites , DO Cardiologist - no  PPM/ICD -  Device Orders -  Rep Notified -   Chest x-ray - 03-02-22 epic EKG - 04-20-22 epic Stress Test -  ECHO - 04-16-22 epic Cardiac Cath -   Sleep Study -  CPAP -   Fasting Blood Sugar -  Checks Blood Sugar _____ times a day  Blood Thinner Instructions: Aspirin Instructions:  81 mg asa   ERAS Protcol - PRE-SURGERY G2-    COVID vaccine -yes x 3  Activity--Able to climb a flight of stairs without SOB or CP Anesthesia review: DM TIA 04-2022, HTN OSA MONJARO STOP 7 DAYS PRIOR  Has not taken since First week of February due to out of stock  Patient denies shortness of breath, fever, cough and chest pain at PAT appointment   All instructions explained to the patient, with a verbal understanding of the material. Patient agrees to go over the instructions while at home for a better understanding. Patient also instructed to self quarantine after being tested for COVID-19. The opportunity to ask questions was provided.

## 2022-10-11 NOTE — Patient Instructions (Addendum)
SURGICAL WAITING ROOM VISITATION  Patients having surgery or a procedure may have no more than 2 support people in the waiting area - these visitors may rotate.    Children under the age of 20 must have an adult with them who is not the patient.  Due to an increase in RSV and influenza rates and associated hospitalizations, children ages 16 and under may not visit patients in [place].  If the patient needs to stay at the hospital during part of their recovery, the visitor guidelines for inpatient rooms apply. Pre-op nurse will coordinate an appropriate time for 1 support person to accompany patient in pre-op.  This support person may not rotate.    Please refer to the [facility] website for the visitor guidelines for Inpatients (after your surgery is over and you are in a regular room).       Your procedure is scheduled on: 10-18-22   Report to [facility] Main Entrance    Report to admitting at      0830  AM   Call this number if you have problems the morning of surgery [phone]              No food after midnight   After Midnight you may have the following liquids until _0745_____ AM/ DAY OF SURGERY  Water Non-Citrus Juices (without pulp, NO RED-Apple, White grape, White cranberry) Black Coffee (NO MILK/CREAM OR CREAMERS, sugar ok)  Clear Tea (NO MILK/CREAM OR CREAMERS, sugar ok) regular and decaf                             Plain Jell-O (NO RED)                                           Fruit ices (not with fruit pulp, NO RED)                                     Popsicles (NO RED)                                                               Sports drinks like Gatorade (NO RED)                   The day of surgery:  Drink ONE (1) Pre-Surgery G2 at   0745  AM the morning of surgery. Drink in one sitting. Do not sip.  This drink was given to you during your hospital  pre-op appointment visit. Nothing else to drink after completing the  Pre-Surgery  Clear G2.          If you have questions, please contact your surgeon's office.   FOLLOW ANY ADDITIONAL PRE OP INSTRUCTIONS YOU RECEIVED FROM YOUR SURGEON'S OFFICE!!!     Oral Hygiene is also important to reduce your risk of infection.  Remember - BRUSH YOUR TEETH THE MORNING OF SURGERY WITH YOUR REGULAR TOOTHPASTE  DENTURES WILL BE REMOVED PRIOR TO SURGERY PLEASE DO NOT APPLY "Poly grip" OR ADHESIVES!!!   Do NOT smoke after Midnight   Take these medicines the morning of surgery with A SIP OF WATER: atorvastatin, amlodipine              STOP [place] 7 DAYS PRIOR TO SURGERY DO NOT TAKE ANY ORAL DIABETIC MEDICATIONS DAY OF YOUR SURGERY  How to Manage Your Diabetes Before and After Surgery  Why is it important to control my blood sugar before and after surgery? Improving blood sugar levels before and after surgery helps healing and can limit problems. A way of improving blood sugar control is eating a healthy diet by:  Eating less sugar and carbohydrates  Increasing activity/exercise  Talking with your doctor about reaching your blood sugar goals High blood sugars (greater than 180 mg/dL) can raise your risk of infections and slow your recovery, so you will need to focus on controlling your diabetes during the weeks before surgery. Make sure that the doctor who takes care of your diabetes knows about your planned surgery including the date and location.  How do I manage my blood sugar before surgery? Check your blood sugar at least 4 times a day, starting 2 days before surgery, to make sure that the level is not too high or low. Check your blood sugar the morning of your surgery when you wake up and every 2 hours until you get to the Short Stay unit. If your blood sugar is less than 70 mg/dL, you will need to treat for low blood sugar: Do not take insulin. Treat a low blood sugar (less than 70 mg/dL) with  cup of clear juice (cranberry or apple), 4  glucose tablets, OR glucose gel. Recheck blood sugar in 15 minutes after treatment (to make sure it is greater than 70 mg/dL). If your blood sugar is not greater than 70 mg/dL on recheck, call [phone] for further instructions. Report your blood sugar to the short stay nurse when you get to Short Stay.  If you are admitted to the hospital after surgery: Your blood sugar will be checked by the staff and you will probably be given insulin after surgery (instead of oral diabetes medicines) to make sure you have good blood sugar levels. The goal for blood sugar control after surgery is 80-180 mg/dL.   WHAT DO I DO ABOUT MY DIABETES MEDICATION?  Do not take oral diabetes medicines (pills) the morning of surgery.   DO NOT TAKE THE FOLLOWING 7 DAYS PRIOR TO SURGERY: Ozempic, Wegovy, Rybelsus (Semaglutide), Byetta (exenatide), Bydureon (exenatide ER), Victoza, Saxenda (liraglutide), or Trulicity (dulaglutide) Mounjaro (Tirzepatide) Adlyxin (Lixisenatide), Polyethylene Glycol Loxenatide.    Bring CPAP mask and tubing day of surgery.                              You may not have any metal on your body including hair pins, jewelry, and body piercing             Do not wear lotions, powders, perfumes/cologne, or deodorant              Men may shave face and neck.   Do not bring valuables to the hospital. [place].  Contacts, glasses, dentures or bridgework may not be worn into surgery.   Bring small overnight bag day of surgery.   DO NOT [place]. PHARMACY WILL DISPENSE MEDICATIONS LISTED ON YOUR MEDICATION LIST TO YOU DURING YOUR ADMISSION [place]!    Patients discharged on the day of surgery will not be allowed to drive home.  Someone NEEDS to stay with you for the first 24 hours after anesthesia.                Please read over the following fact sheets you were given: IF [place] [phone]  If you test positive for Covid or have been in contact with anyone that has tested positive in the last 10 days please notify you surgeon.    [facility] - Preparing for Surgery Before surgery, you can play an important role.  Because skin is not sterile, your skin needs to be as free of germs as possible.  You can reduce the number of germs on your skin by washing with CHG (chlorahexidine gluconate) soap before surgery.  CHG is an antiseptic cleaner which kills germs and bonds with the skin to continue killing germs even after washing. Please DO NOT use if you have an allergy to CHG or antibacterial soaps.  If your skin becomes reddened/irritated stop using the CHG and inform your nurse when you arrive at Short Stay. Do not shave (including legs and underarms) for at least 48 hours prior to the first CHG shower.  You may shave your face/neck. Please follow these instructions carefully:  1.  Shower with CHG Soap the night before surgery and the  morning of Surgery.  2.  If you choose to wash your hair, wash your hair first as usual with your  normal  shampoo.  3.  After you shampoo, rinse your hair and body thoroughly to remove the  shampoo.                           4.  Use CHG as you would any other liquid soap.  You can apply chg directly  to the skin and wash                       Gently with a scrungie or clean washcloth.  5.  Apply the CHG Soap to your body ONLY FROM THE NECK DOWN.   Do not use on face/ open                           Wound or open sores. Avoid contact with eyes, ears mouth and genitals (private parts).                       Wash face,  Genitals (private parts) with your normal soap.             6.  Wash thoroughly, paying special attention to the area where your surgery  will be performed.  7.  Thoroughly rinse your body with warm water from the neck down.  8.  DO NOT shower/wash with your normal soap after using and  rinsing off  the CHG Soap.                9.  Pat yourself dry with a clean towel.  10.  Wear clean pajamas.            11.  Place clean sheets on your bed the night of your first shower and do not  sleep with pets. Day of Surgery : Do not apply any lotions/deodorants the morning of surgery.  Please wear clean clothes to the hospital/surgery center.  FAILURE TO FOLLOW THESE INSTRUCTIONS MAY RESULT IN THE CANCELLATION OF YOUR SURGERY PATIENT SIGNATURE_________________________________  NURSE SIGNATURE__________________________________  ________________________________________________________________________  Adam Phenix  An incentive spirometer is a tool that can help keep your lungs clear and active. This tool measures how well you are filling your lungs with each breath. Taking long deep breaths may help reverse or decrease the chance of developing breathing (pulmonary) problems (especially infection) following: A long period of time when you are unable to move or be active. BEFORE THE PROCEDURE  If the spirometer includes an indicator to show your best effort, your nurse or respiratory therapist will set it to a desired goal. If possible, sit up straight or lean slightly forward. Try not to slouch. Hold the incentive spirometer in an upright position. INSTRUCTIONS FOR USE  Sit on the edge of your bed if possible, or sit up as far as you can in bed or on a chair. Hold the incentive spirometer in an upright position. Breathe out normally. Place the mouthpiece in your mouth and seal your lips tightly around it. Breathe in slowly and as deeply as possible, raising the piston or the ball toward the top of the column. Hold your breath for 3-5 seconds or for as long as possible. Allow the piston or ball to fall to the bottom of the column. Remove the mouthpiece from your mouth and breathe out normally. Rest for a few seconds and repeat Steps 1 through 7 at least 10 times  every 1-2 hours when you are awake. Take your time and take a few normal breaths between deep breaths. The spirometer may include an indicator to show your best effort. Use the indicator as a goal to work toward during each repetition. After each set of 10 deep breaths, practice coughing to be sure your lungs are clear. If you have an incision (the cut made at the time of surgery), support your incision when coughing by placing a pillow or rolled up towels firmly against it. Once you are able to get out of bed, walk around indoors and cough well. You may stop using the incentive spirometer when instructed by your caregiver.  RISKS AND COMPLICATIONS Take your time so you do not get dizzy or light-headed. If you are in pain, you may need to take or ask for pain medication before doing incentive spirometry. It is harder to take a deep breath if you are having pain. AFTER USE Rest and breathe slowly and easily. It can be helpful to keep track of a log of your progress. Your caregiver can provide you with a simple table to help with this. If you are using the spirometer at home, follow these instructions: [place] IF:  You are having difficultly using the spirometer. You have trouble using the spirometer as often as instructed. Your pain medication is not giving enough relief while using the spirometer. You develop fever of 100.5 F (38.1 C) or higher. SEEK IMMEDIATE MEDICAL CARE IF:  You cough up bloody sputum that had not been present before. You develop fever of 102 F (38.9 C) or greater. You develop worsening pain at or near the  incision site. MAKE SURE YOU:  Understand these instructions. Will watch your condition. Will get help right away if you are not doing well or get worse. Document Released: 12/13/2006 Document Revised: 10/25/2011 Document Reviewed: 02/13/2007 ExitCare Patient Information 2014 ExitCare,  [place].   ________________________________________________________________________ WHAT IS A BLOOD TRANSFUSION? Blood Transfusion Information  A transfusion is the replacement of blood or some of its parts. Blood is made up of multiple cells which provide different functions. Red blood cells carry oxygen and are used for blood loss replacement. White blood cells fight against infection. Platelets control bleeding. Plasma helps clot blood. Other blood products are available for specialized needs, such as hemophilia or other clotting disorders. BEFORE THE TRANSFUSION  Who gives blood for transfusions?  Healthy volunteers who are fully evaluated to make sure their blood is safe. This is blood bank blood. Transfusion therapy is the safest it has ever been in the practice of medicine. Before blood is taken from a donor, a complete history is taken to make sure that person has no history of diseases nor engages in risky social behavior (examples are intravenous drug use or sexual activity with multiple partners). The donor's travel history is screened to minimize risk of transmitting infections, such as malaria. The donated blood is tested for signs of infectious diseases, such as HIV and hepatitis. The blood is then tested to be sure it is compatible with you in order to minimize the chance of a transfusion reaction. If you or a relative donates blood, this is often done in anticipation of surgery and is not appropriate for emergency situations. It takes many days to process the donated blood. RISKS AND COMPLICATIONS Although transfusion therapy is very safe and saves many lives, the main dangers of transfusion include:  Getting an infectious disease. Developing a transfusion reaction. This is an allergic reaction to something in the blood you were given. Every precaution is taken to prevent this. The decision to have a blood transfusion has been considered carefully by your caregiver before blood is  given. Blood is not given unless the benefits outweigh the risks. AFTER THE TRANSFUSION Right after receiving a blood transfusion, you will usually feel much better and more energetic. This is especially true if your red blood cells have gotten low (anemic). The transfusion raises the level of the red blood cells which carry oxygen, and this usually causes an energy increase. The nurse administering the transfusion will monitor you carefully for complications. HOME CARE INSTRUCTIONS  No special instructions are needed after a transfusion. You may find your energy is better. Speak with your caregiver about any limitations on activity for underlying diseases you may have. SEEK MEDICAL CARE IF:  Your condition is not improving after your transfusion. You develop redness or irritation at the intravenous (IV) site. SEEK IMMEDIATE MEDICAL CARE IF:  Any of the following symptoms occur over the next 12 hours: Shaking chills. You have a temperature by mouth above 102 F (38.9 C), not controlled by medicine. Chest, back, or muscle pain. People around you feel you are not acting correctly or are confused. Shortness of breath or difficulty breathing. Dizziness and fainting. You get a rash or develop hives. You have a decrease in urine output. Your urine turns a dark color or changes to pink, red, or brown. Any of the following symptoms occur over the next 10 days: You have a temperature by mouth above 102 F (38.9 C), not controlled by medicine. Shortness of breath. Weakness after normal activity. The  white part of the eye turns yellow (jaundice). You have a decrease in the amount of urine or are urinating less often. Your urine turns a dark color or changes to pink, red, or brown. Document Released: 07/30/2000 Document Revised: 10/25/2011 Document Reviewed: 03/18/2008 [facility] Patient Information 2014 [place], [place].  _______________________________________________________________________

## 2022-10-12 ENCOUNTER — Encounter (HOSPITAL_COMMUNITY)
Admission: RE | Admit: 2022-10-12 | Discharge: 2022-10-12 | Disposition: A | Discharge status: Home / Self Care | Payer: BC Managed Care – PPO | Source: Ambulatory Visit | Attending: General Surgery | Admitting: General Surgery

## 2022-10-12 ENCOUNTER — Other Ambulatory Visit: Payer: Self-pay

## 2022-10-12 ENCOUNTER — Encounter (HOSPITAL_COMMUNITY): Payer: Self-pay

## 2022-10-12 VITALS — BP 181/96 | HR 73 | Temp 98.3°F | Resp 16 | Ht 71.0 in | Wt 350.0 lb

## 2022-10-12 DIAGNOSIS — Z01812 Encounter for preprocedural laboratory examination: Secondary | ICD-10-CM | POA: Diagnosis not present

## 2022-10-12 DIAGNOSIS — E1165 Type 2 diabetes mellitus with hyperglycemia: Secondary | ICD-10-CM | POA: Insufficient documentation

## 2022-10-12 HISTORY — DX: Personal history of urinary calculi: Z87.442

## 2022-10-12 HISTORY — DX: Polyneuropathy, unspecified: G62.9

## 2022-10-12 LAB — CBC WITH DIFFERENTIAL/PLATELET
Abs Immature Granulocytes: 0.02 10*3/uL (ref 0.00–0.07)
Basophils Absolute: 0.1 10*3/uL (ref 0.0–0.1)
Basophils Relative: 1 %
Eosinophils Absolute: 0.3 10*3/uL (ref 0.0–0.5)
Eosinophils Relative: 3 %
HCT: 41.2 % (ref 39.0–52.0)
Hemoglobin: 13.1 g/dL (ref 13.0–17.0)
Immature Granulocytes: 0 %
Lymphocytes Relative: 50 %
Lymphs Abs: 4.3 10*3/uL — ABNORMAL HIGH (ref 0.7–4.0)
MCH: 28.2 pg (ref 26.0–34.0)
MCHC: 31.8 g/dL (ref 30.0–36.0)
MCV: 88.6 fL (ref 80.0–100.0)
Monocytes Absolute: 0.6 10*3/uL (ref 0.1–1.0)
Monocytes Relative: 7 %
Neutro Abs: 3.4 10*3/uL (ref 1.7–7.7)
Neutrophils Relative %: 39 %
Platelets: 262 10*3/uL (ref 150–400)
RBC: 4.65 MIL/uL (ref 4.22–5.81)
RDW: 14.7 % (ref 11.5–15.5)
WBC: 8.7 10*3/uL (ref 4.0–10.5)
nRBC: 0 % (ref 0.0–0.2)

## 2022-10-12 LAB — COMPREHENSIVE METABOLIC PANEL
ALT: 27 U/L (ref 0–44)
AST: 28 U/L (ref 15–41)
Albumin: 4.1 g/dL (ref 3.5–5.0)
Alkaline Phosphatase: 57 U/L (ref 38–126)
Anion gap: 9 (ref 5–15)
BUN: 16 mg/dL (ref 6–20)
CO2: 24 mmol/L (ref 22–32)
Calcium: 9.2 mg/dL (ref 8.9–10.3)
Chloride: 102 mmol/L (ref 98–111)
Creatinine, Ser: 1.1 mg/dL (ref 0.61–1.24)
GFR, Estimated: >60 mL/min (ref >60)
Glucose, Bld: 138 mg/dL — ABNORMAL HIGH (ref 70–99)
Potassium: 3.9 mmol/L (ref 3.5–5.1)
Sodium: 135 mmol/L (ref 135–145)
Total Bilirubin: 0.9 mg/dL (ref 0.3–1.2)
Total Protein: 7.8 g/dL (ref 6.5–8.1)

## 2022-10-12 LAB — GLUCOSE, CAPILLARY: Glucose-Capillary: 119 mg/dL — ABNORMAL HIGH (ref 70–99)

## 2022-10-13 LAB — HEMOGLOBIN A1C
Hgb A1c MFr Bld: 6.9 % — ABNORMAL HIGH (ref 4.8–5.6)
Mean Plasma Glucose: 151 mg/dL

## 2022-10-18 ENCOUNTER — Inpatient Hospital Stay (HOSPITAL_COMMUNITY): Payer: BC Managed Care – PPO | Admitting: Anesthesiology

## 2022-10-18 ENCOUNTER — Encounter (HOSPITAL_COMMUNITY): Payer: Self-pay | Admitting: General Surgery

## 2022-10-18 ENCOUNTER — Inpatient Hospital Stay (HOSPITAL_COMMUNITY)
Admission: RE | Admit: 2022-10-18 | Discharge: 2022-10-19 | DRG: 621 | Disposition: A | Discharge status: Home / Self Care | Payer: BC Managed Care – PPO | Attending: General Surgery | Admitting: General Surgery

## 2022-10-18 ENCOUNTER — Other Ambulatory Visit: Payer: Self-pay

## 2022-10-18 ENCOUNTER — Encounter (HOSPITAL_COMMUNITY)
Admission: RE | Disposition: A | Discharge status: Home / Self Care | Payer: Self-pay | Source: Home / Self Care | Attending: General Surgery

## 2022-10-18 DIAGNOSIS — E119 Type 2 diabetes mellitus without complications: Secondary | ICD-10-CM | POA: Diagnosis present

## 2022-10-18 DIAGNOSIS — G4733 Obstructive sleep apnea (adult) (pediatric): Secondary | ICD-10-CM | POA: Diagnosis present

## 2022-10-18 DIAGNOSIS — Z7982 Long term (current) use of aspirin: Secondary | ICD-10-CM

## 2022-10-18 DIAGNOSIS — Z79899 Other long term (current) drug therapy: Secondary | ICD-10-CM | POA: Diagnosis not present

## 2022-10-18 DIAGNOSIS — Z833 Family history of diabetes mellitus: Secondary | ICD-10-CM | POA: Diagnosis not present

## 2022-10-18 DIAGNOSIS — E1165 Type 2 diabetes mellitus with hyperglycemia: Secondary | ICD-10-CM

## 2022-10-18 DIAGNOSIS — Z8249 Family history of ischemic heart disease and other diseases of the circulatory system: Secondary | ICD-10-CM

## 2022-10-18 DIAGNOSIS — F17200 Nicotine dependence, unspecified, uncomplicated: Secondary | ICD-10-CM | POA: Diagnosis not present

## 2022-10-18 DIAGNOSIS — Z8673 Personal history of transient ischemic attack (TIA), and cerebral infarction without residual deficits: Secondary | ICD-10-CM

## 2022-10-18 DIAGNOSIS — R590 Localized enlarged lymph nodes: Secondary | ICD-10-CM | POA: Diagnosis present

## 2022-10-18 DIAGNOSIS — I10 Essential (primary) hypertension: Secondary | ICD-10-CM | POA: Diagnosis present

## 2022-10-18 DIAGNOSIS — Z6841 Body Mass Index (BMI) 40.0 and over, adult: Secondary | ICD-10-CM | POA: Diagnosis not present

## 2022-10-18 DIAGNOSIS — Z7984 Long term (current) use of oral hypoglycemic drugs: Secondary | ICD-10-CM | POA: Diagnosis not present

## 2022-10-18 DIAGNOSIS — E785 Hyperlipidemia, unspecified: Secondary | ICD-10-CM | POA: Diagnosis present

## 2022-10-18 DIAGNOSIS — E559 Vitamin D deficiency, unspecified: Secondary | ICD-10-CM | POA: Diagnosis not present

## 2022-10-18 DIAGNOSIS — Z9884 Bariatric surgery status: Principal | ICD-10-CM

## 2022-10-18 HISTORY — PX: UPPER GI ENDOSCOPY: SHX6162

## 2022-10-18 HISTORY — PX: LAPAROSCOPIC GASTRIC SLEEVE RESECTION: SHX5895

## 2022-10-18 LAB — CBC
HCT: 40.8 % (ref 39.0–52.0)
Hemoglobin: 13 g/dL (ref 13.0–17.0)
MCH: 27.8 pg (ref 26.0–34.0)
MCHC: 31.9 g/dL (ref 30.0–36.0)
MCV: 87.2 fL (ref 80.0–100.0)
Platelets: 310 10*3/uL (ref 150–400)
RBC: 4.68 MIL/uL (ref 4.22–5.81)
RDW: 14.6 % (ref 11.5–15.5)
WBC: 12.1 10*3/uL — ABNORMAL HIGH (ref 4.0–10.5)
nRBC: 0 % (ref 0.0–0.2)

## 2022-10-18 LAB — HEMOGLOBIN AND HEMATOCRIT, BLOOD
HCT: 44.4 % (ref 39.0–52.0)
Hemoglobin: 14 g/dL (ref 13.0–17.0)

## 2022-10-18 LAB — GLUCOSE, CAPILLARY
Glucose-Capillary: 108 mg/dL — ABNORMAL HIGH (ref 70–99)
Glucose-Capillary: 183 mg/dL — ABNORMAL HIGH (ref 70–99)
Glucose-Capillary: 237 mg/dL — ABNORMAL HIGH (ref 70–99)

## 2022-10-18 LAB — ABO/RH: ABO/RH(D): O POS

## 2022-10-18 LAB — TYPE AND SCREEN
ABO/RH(D): O POS
Antibody Screen: NEGATIVE

## 2022-10-18 SURGERY — GASTRECTOMY, SLEEVE, LAPAROSCOPIC
Anesthesia: General

## 2022-10-18 MED ORDER — CHLORHEXIDINE GLUCONATE 0.12 % MT SOLN
15.0000 mL | Freq: Once | OROMUCOSAL | Status: AC
  Administered 2022-10-18: 15 mL via OROMUCOSAL

## 2022-10-18 MED ORDER — MORPHINE SULFATE (PF) 2 MG/ML IV SOLN
INTRAVENOUS | Status: AC
  Filled 2022-10-18: qty 1

## 2022-10-18 MED ORDER — ACETAMINOPHEN 160 MG/5ML PO SOLN
1000.0000 mg | Freq: Three times a day (TID) | ORAL | Status: DC
  Administered 2022-10-18 – 2022-10-19 (×2): 1000 mg via ORAL
  Filled 2022-10-18 (×2): qty 40.6

## 2022-10-18 MED ORDER — BUPIVACAINE-EPINEPHRINE (PF) 0.25% -1:200000 IJ SOLN
INTRAMUSCULAR | Status: AC
  Filled 2022-10-18: qty 30

## 2022-10-18 MED ORDER — FENTANYL CITRATE (PF) 250 MCG/5ML IJ SOLN
INTRAMUSCULAR | Status: AC
  Filled 2022-10-18: qty 5

## 2022-10-18 MED ORDER — KCL IN DEXTROSE-NACL 20-5-0.45 MEQ/L-%-% IV SOLN
INTRAVENOUS | Status: AC
  Filled 2022-10-18: qty 1000

## 2022-10-18 MED ORDER — HEPARIN SODIUM (PORCINE) 5000 UNIT/ML IJ SOLN
5000.0000 [IU] | Freq: Three times a day (TID) | INTRAMUSCULAR | Status: DC
  Administered 2022-10-18 – 2022-10-19 (×3): 5000 [IU] via SUBCUTANEOUS
  Filled 2022-10-18 (×3): qty 1

## 2022-10-18 MED ORDER — ONDANSETRON HCL 4 MG/2ML IJ SOLN: 4.0000 mg | Freq: Four times a day (QID) | INTRAMUSCULAR | Status: DC | PRN

## 2022-10-18 MED ORDER — HYDROCHLOROTHIAZIDE 12.5 MG PO TABS
12.5000 mg | ORAL_TABLET | Freq: Every day | ORAL | Status: DC
  Administered 2022-10-19: 12.5 mg via ORAL
  Filled 2022-10-18: qty 1

## 2022-10-18 MED ORDER — HYDRALAZINE HCL 20 MG/ML IJ SOLN: 10.0000 mg | INTRAMUSCULAR | Status: DC | PRN

## 2022-10-18 MED ORDER — PROPOFOL 10 MG/ML IV BOLUS
INTRAVENOUS | Status: AC
  Filled 2022-10-18: qty 20

## 2022-10-18 MED ORDER — PHENYLEPHRINE 80 MCG/ML (10ML) SYRINGE FOR IV PUSH (FOR BLOOD PRESSURE SUPPORT)
PREFILLED_SYRINGE | INTRAVENOUS | Status: AC
  Filled 2022-10-18: qty 10

## 2022-10-18 MED ORDER — ROCURONIUM BROMIDE 10 MG/ML (PF) SYRINGE
PREFILLED_SYRINGE | INTRAVENOUS | Status: DC | PRN
  Administered 2022-10-18: 70 mg via INTRAVENOUS
  Administered 2022-10-18: 20 mg via INTRAVENOUS

## 2022-10-18 MED ORDER — CHLORHEXIDINE GLUCONATE 4 % EX LIQD: Freq: Once | CUTANEOUS | Status: DC

## 2022-10-18 MED ORDER — APREPITANT 40 MG PO CAPS
40.0000 mg | ORAL_CAPSULE | ORAL | Status: AC
  Administered 2022-10-18: 40 mg via ORAL
  Filled 2022-10-18: qty 1

## 2022-10-18 MED ORDER — BUPIVACAINE-EPINEPHRINE (PF) 0.25% -1:200000 IJ SOLN
INTRAMUSCULAR | Status: DC | PRN
  Administered 2022-10-18: 50 mL

## 2022-10-18 MED ORDER — SUGAMMADEX SODIUM 500 MG/5ML IV SOLN
INTRAVENOUS | Status: DC | PRN
  Administered 2022-10-18: 500 mg via INTRAVENOUS

## 2022-10-18 MED ORDER — AMLODIPINE BESYLATE 10 MG PO TABS
10.0000 mg | ORAL_TABLET | Freq: Every day | ORAL | Status: DC
  Administered 2022-10-19: 10 mg via ORAL
  Filled 2022-10-18: qty 1

## 2022-10-18 MED ORDER — LOSARTAN POTASSIUM-HCTZ 100-12.5 MG PO TABS: 1.0000 | ORAL_TABLET | Freq: Every day | ORAL | Status: DC

## 2022-10-18 MED ORDER — SODIUM CHLORIDE 0.9 % IV SOLN
2.0000 g | INTRAVENOUS | Status: AC
  Administered 2022-10-18: 2 g via INTRAVENOUS
  Filled 2022-10-18: qty 2

## 2022-10-18 MED ORDER — HEPARIN SODIUM (PORCINE) 5000 UNIT/ML IJ SOLN
5000.0000 [IU] | INTRAMUSCULAR | Status: AC
  Administered 2022-10-18: 5000 [IU] via SUBCUTANEOUS
  Filled 2022-10-18: qty 1

## 2022-10-18 MED ORDER — MIDAZOLAM HCL 2 MG/2ML IJ SOLN
INTRAMUSCULAR | Status: AC
  Filled 2022-10-18: qty 2

## 2022-10-18 MED ORDER — INSULIN ASPART 100 UNIT/ML IJ SOLN
0.0000 [IU] | INTRAMUSCULAR | Status: DC
  Administered 2022-10-18: 5 [IU] via SUBCUTANEOUS
  Administered 2022-10-19: 2 [IU] via SUBCUTANEOUS
  Administered 2022-10-19: 5 [IU] via SUBCUTANEOUS
  Administered 2022-10-19 (×2): 3 [IU] via SUBCUTANEOUS

## 2022-10-18 MED ORDER — ORAL CARE MOUTH RINSE: 15.0000 mL | Freq: Once | OROMUCOSAL | Status: AC

## 2022-10-18 MED ORDER — KCL IN DEXTROSE-NACL 20-5-0.45 MEQ/L-%-% IV SOLN
INTRAVENOUS | Status: DC
  Filled 2022-10-18 (×2): qty 1000

## 2022-10-18 MED ORDER — BUPIVACAINE LIPOSOME 1.3 % IJ SUSP
INTRAMUSCULAR | Status: AC
  Filled 2022-10-18: qty 20

## 2022-10-18 MED ORDER — FENTANYL CITRATE PF 50 MCG/ML IJ SOSY
25.0000 ug | PREFILLED_SYRINGE | INTRAMUSCULAR | Status: DC | PRN
  Administered 2022-10-18: 25 ug via INTRAVENOUS

## 2022-10-18 MED ORDER — SPIRONOLACTONE 25 MG PO TABS
25.0000 mg | ORAL_TABLET | Freq: Every day | ORAL | Status: DC
  Administered 2022-10-19: 25 mg via ORAL
  Filled 2022-10-18: qty 1

## 2022-10-18 MED ORDER — SUGAMMADEX SODIUM 500 MG/5ML IV SOLN
INTRAVENOUS | Status: AC
  Filled 2022-10-18: qty 5

## 2022-10-18 MED ORDER — LACTATED RINGERS IV SOLN
INTRAVENOUS | Status: DC
  Administered 2022-10-18: 1000 mL via INTRAVENOUS

## 2022-10-18 MED ORDER — LACTATED RINGERS IR SOLN
Status: DC | PRN
  Administered 2022-10-18: 1000 mL

## 2022-10-18 MED ORDER — STERILE WATER FOR IRRIGATION IR SOLN
Status: DC | PRN
  Administered 2022-10-18: 500 mL
  Administered 2022-10-18: 2000 mL

## 2022-10-18 MED ORDER — PANTOPRAZOLE SODIUM 40 MG IV SOLR
40.0000 mg | Freq: Every day | INTRAVENOUS | Status: DC
  Administered 2022-10-18: 40 mg via INTRAVENOUS
  Filled 2022-10-18: qty 10

## 2022-10-18 MED ORDER — 0.9 % SODIUM CHLORIDE (POUR BTL) OPTIME
TOPICAL | Status: DC | PRN
  Administered 2022-10-18: 1000 mL

## 2022-10-18 MED ORDER — MIDAZOLAM HCL 2 MG/2ML IJ SOLN
INTRAMUSCULAR | Status: DC | PRN
  Administered 2022-10-18: 2 mg via INTRAVENOUS

## 2022-10-18 MED ORDER — ACETAMINOPHEN 500 MG PO TABS
1000.0000 mg | ORAL_TABLET | Freq: Three times a day (TID) | ORAL | Status: DC
  Administered 2022-10-19: 1000 mg via ORAL
  Filled 2022-10-18 (×2): qty 2

## 2022-10-18 MED ORDER — FENTANYL CITRATE PF 50 MCG/ML IJ SOSY
PREFILLED_SYRINGE | INTRAMUSCULAR | Status: AC
  Administered 2022-10-18: 25 ug via INTRAVENOUS
  Filled 2022-10-18: qty 1

## 2022-10-18 MED ORDER — PROMETHAZINE HCL 25 MG/ML IJ SOLN: 6.2500 mg | INTRAMUSCULAR | Status: DC | PRN

## 2022-10-18 MED ORDER — LIDOCAINE HCL (PF) 2 % IJ SOLN
INTRAMUSCULAR | Status: AC
  Filled 2022-10-18: qty 5

## 2022-10-18 MED ORDER — OXYCODONE HCL 5 MG/5ML PO SOLN
5.0000 mg | Freq: Four times a day (QID) | ORAL | Status: DC | PRN
  Administered 2022-10-19: 5 mg via ORAL
  Filled 2022-10-18: qty 5

## 2022-10-18 MED ORDER — LIDOCAINE 2% (20 MG/ML) 5 ML SYRINGE
INTRAMUSCULAR | Status: DC | PRN
  Administered 2022-10-18: 60 mg via INTRAVENOUS

## 2022-10-18 MED ORDER — DEXAMETHASONE SODIUM PHOSPHATE 4 MG/ML IJ SOLN
4.0000 mg | INTRAMUSCULAR | Status: AC
  Administered 2022-10-18: 4 mg via INTRAVENOUS

## 2022-10-18 MED ORDER — ACETAMINOPHEN 500 MG PO TABS
1000.0000 mg | ORAL_TABLET | ORAL | Status: AC
  Administered 2022-10-18: 1000 mg via ORAL
  Filled 2022-10-18: qty 2

## 2022-10-18 MED ORDER — BUPIVACAINE LIPOSOME 1.3 % IJ SUSP: 20.0000 mL | Freq: Once | INTRAMUSCULAR | Status: DC

## 2022-10-18 MED ORDER — DEXAMETHASONE SODIUM PHOSPHATE 10 MG/ML IJ SOLN
INTRAMUSCULAR | Status: AC
  Filled 2022-10-18: qty 1

## 2022-10-18 MED ORDER — ONDANSETRON HCL 4 MG/2ML IJ SOLN
INTRAMUSCULAR | Status: AC
  Filled 2022-10-18: qty 2

## 2022-10-18 MED ORDER — SIMETHICONE 80 MG PO CHEW: 80.0000 mg | CHEWABLE_TABLET | Freq: Four times a day (QID) | ORAL | Status: DC | PRN

## 2022-10-18 MED ORDER — LOSARTAN POTASSIUM 50 MG PO TABS
100.0000 mg | ORAL_TABLET | Freq: Every day | ORAL | Status: DC
  Administered 2022-10-19: 100 mg via ORAL
  Filled 2022-10-18: qty 2

## 2022-10-18 MED ORDER — ONDANSETRON HCL 4 MG/2ML IJ SOLN
INTRAMUSCULAR | Status: DC | PRN
  Administered 2022-10-18: 4 mg via INTRAVENOUS

## 2022-10-18 MED ORDER — SODIUM CHLORIDE 0.9 % IV SOLN: 12.5000 mg | Freq: Four times a day (QID) | INTRAVENOUS | Status: DC | PRN

## 2022-10-18 MED ORDER — KETAMINE HCL 10 MG/ML IJ SOLN
INTRAMUSCULAR | Status: AC
  Filled 2022-10-18: qty 1

## 2022-10-18 MED ORDER — ROCURONIUM BROMIDE 10 MG/ML (PF) SYRINGE
PREFILLED_SYRINGE | INTRAVENOUS | Status: AC
  Filled 2022-10-18: qty 10

## 2022-10-18 MED ORDER — LIDOCAINE HCL 2 % IJ SOLN
INTRAMUSCULAR | Status: AC
  Filled 2022-10-18: qty 20

## 2022-10-18 MED ORDER — SCOPOLAMINE 1 MG/3DAYS TD PT72
1.0000 | MEDICATED_PATCH | TRANSDERMAL | Status: DC
  Administered 2022-10-18: 1.5 mg via TRANSDERMAL
  Filled 2022-10-18: qty 1

## 2022-10-18 MED ORDER — MORPHINE SULFATE (PF) 2 MG/ML IV SOLN
1.0000 mg | INTRAVENOUS | Status: DC | PRN
  Administered 2022-10-18: 2 mg via INTRAVENOUS

## 2022-10-18 MED ORDER — PROPOFOL 10 MG/ML IV BOLUS
INTRAVENOUS | Status: DC | PRN
  Administered 2022-10-18: 200 mg via INTRAVENOUS

## 2022-10-18 MED ORDER — KETAMINE HCL 10 MG/ML IJ SOLN
INTRAMUSCULAR | Status: DC | PRN
  Administered 2022-10-18: 35 mg via INTRAVENOUS

## 2022-10-18 MED ORDER — LIDOCAINE 20MG/ML (2%) 15 ML SYRINGE OPTIME
INTRAMUSCULAR | Status: DC | PRN
  Administered 2022-10-18: 1.5 mg/kg/h via INTRAVENOUS

## 2022-10-18 MED ORDER — FENTANYL CITRATE (PF) 250 MCG/5ML IJ SOLN
INTRAMUSCULAR | Status: DC | PRN
  Administered 2022-10-18: 100 ug via INTRAVENOUS
  Administered 2022-10-18 (×3): 50 ug via INTRAVENOUS

## 2022-10-18 MED ORDER — PHENYLEPHRINE 80 MCG/ML (10ML) SYRINGE FOR IV PUSH (FOR BLOOD PRESSURE SUPPORT)
PREFILLED_SYRINGE | INTRAVENOUS | Status: DC | PRN
  Administered 2022-10-18: 80 ug via INTRAVENOUS
  Administered 2022-10-18: 160 ug via INTRAVENOUS

## 2022-10-18 MED ORDER — ENSURE MAX PROTEIN PO LIQD
2.0000 [oz_av] | ORAL | Status: DC
  Administered 2022-10-19 (×5): 2 [oz_av] via ORAL

## 2022-10-18 SURGICAL SUPPLY — 82 items
ANTIFOG SOL W/FOAM PAD STRL (MISCELLANEOUS) ×1
APL PRP STRL LF DISP 70% ISPRP (MISCELLANEOUS) ×2
APL SRG 32X5 SNPLK LF DISP (MISCELLANEOUS)
APL SWBSTK 6 STRL LF DISP (MISCELLANEOUS)
APPLICATOR COTTON TIP 6 STRL (MISCELLANEOUS) IMPLANT
APPLICATOR COTTON TIP 6IN STRL (MISCELLANEOUS)
APPLIER CLIP ROT 10 11.4 M/L (STAPLE)
APPLIER CLIP ROT 13.4 12 LRG (CLIP) ×1
APR CLP LRG 13.4X12 ROT 20 MLT (CLIP) ×1
APR CLP MED LRG 11.4X10 (STAPLE)
BAG COUNTER SPONGE SURGICOUNT (BAG) IMPLANT
BAG SPNG CNTER NS LX DISP (BAG)
BLADE SURG SZ11 CARB STEEL (BLADE) ×1 IMPLANT
CABLE HIGH FREQUENCY MONO STRZ (ELECTRODE) IMPLANT
CHLORAPREP W/TINT 26 (MISCELLANEOUS) ×2 IMPLANT
CLIP APPLIE ROT 10 11.4 M/L (STAPLE) IMPLANT
CLIP APPLIE ROT 13.4 12 LRG (CLIP) IMPLANT
COVER SURGICAL LIGHT HANDLE (MISCELLANEOUS) ×1 IMPLANT
DEVICE SUT QUICK LOAD TK 5 (SUTURE) IMPLANT
DEVICE SUT TI-KNOT TK 5X26 (SUTURE) IMPLANT
DEVICE SUTURE ENDOST 10MM (ENDOMECHANICALS) IMPLANT
DISSECTOR BLUNT TIP ENDO 5MM (MISCELLANEOUS) IMPLANT
DRAPE UTILITY XL STRL (DRAPES) ×2 IMPLANT
DRSG TEGADERM 2-3/8X2-3/4 SM (GAUZE/BANDAGES/DRESSINGS) ×6 IMPLANT
ELECT L-HOOK LAP 45CM DISP (ELECTROSURGICAL)
ELECT REM PT RETURN 15FT ADLT (MISCELLANEOUS) ×1 IMPLANT
ELECTRODE L-HOOK LAP 45CM DISP (ELECTROSURGICAL) IMPLANT
GAUZE SPONGE 2X2 STRL 8-PLY (GAUZE/BANDAGES/DRESSINGS) IMPLANT
GAUZE SPONGE 4X4 12PLY STRL (GAUZE/BANDAGES/DRESSINGS) IMPLANT
GLOVE BIO SURGEON STRL SZ7.5 (GLOVE) ×1 IMPLANT
GLOVE INDICATOR 8.0 STRL GRN (GLOVE) ×1 IMPLANT
GOWN STRL REUS W/ TWL XL LVL3 (GOWN DISPOSABLE) ×3 IMPLANT
GOWN STRL REUS W/TWL XL LVL3 (GOWN DISPOSABLE) ×3
GRASPER SUT TROCAR 14GX15 (MISCELLANEOUS) ×1 IMPLANT
IRRIG SUCT STRYKERFLOW 2 WTIP (MISCELLANEOUS) ×1
IRRIGATION SUCT STRKRFLW 2 WTP (MISCELLANEOUS) ×1 IMPLANT
KIT BASIN OR (CUSTOM PROCEDURE TRAY) ×1 IMPLANT
KIT TURNOVER KIT A (KITS) IMPLANT
MARKER SKIN DUAL TIP RULER LAB (MISCELLANEOUS) ×1 IMPLANT
MAT PREVALON FULL STRYKER (MISCELLANEOUS) ×1 IMPLANT
NDL SPNL 22GX3.5 QUINCKE BK (NEEDLE) ×1 IMPLANT
NEEDLE SPNL 22GX3.5 QUINCKE BK (NEEDLE) ×1 IMPLANT
PACK UNIVERSAL I (CUSTOM PROCEDURE TRAY) ×1 IMPLANT
PENCIL SMOKE EVACUATOR (MISCELLANEOUS) IMPLANT
RELOAD STAPLE 60 3.6 BLU REG (STAPLE) ×1 IMPLANT
RELOAD STAPLE 60 3.8 GOLD REG (STAPLE) IMPLANT
RELOAD STAPLE 60 4.1 GRN THCK (STAPLE) ×1 IMPLANT
RELOAD STAPLE 60 BLK VRY/THCK (STAPLE) IMPLANT
RELOAD STAPLER 60MM BLK (STAPLE) IMPLANT
RELOAD STAPLER BLUE 60MM (STAPLE) ×4 IMPLANT
RELOAD STAPLER GOLD 60MM (STAPLE) ×1 IMPLANT
RELOAD STAPLER GREEN 60MM (STAPLE) ×1 IMPLANT
SCISSORS LAP 5X45 EPIX DISP (ENDOMECHANICALS) IMPLANT
SEALANT SURGICAL APPL DUAL CAN (MISCELLANEOUS) IMPLANT
SET TUBE SMOKE EVAC HIGH FLOW (TUBING) ×1 IMPLANT
SHEARS HARMONIC ACE PLUS 45CM (MISCELLANEOUS) ×1 IMPLANT
SLEEVE ADV FIXATION 5X100MM (TROCAR) ×2 IMPLANT
SLEEVE GASTRECTOMY 40FR VISIGI (MISCELLANEOUS) ×1 IMPLANT
SOLUTION ANTFG W/FOAM PAD STRL (MISCELLANEOUS) ×1 IMPLANT
SPIKE FLUID TRANSFER (MISCELLANEOUS) ×1 IMPLANT
STAPLE LINE REINFORCEMENT LAP (STAPLE) IMPLANT
STAPLER ECHELON BIOABSB 60 FLE (MISCELLANEOUS) IMPLANT
STAPLER ECHELON LONG 60 440 (INSTRUMENTS) ×1 IMPLANT
STAPLER RELOAD 60MM BLK (STAPLE)
STAPLER RELOAD BLUE 60MM (STAPLE) ×4
STAPLER RELOAD GOLD 60MM (STAPLE) ×1
STAPLER RELOAD GREEN 60MM (STAPLE) ×1
STRIP CLOSURE SKIN 1/2X4 (GAUZE/BANDAGES/DRESSINGS) ×1 IMPLANT
SUT MNCRL AB 4-0 PS2 18 (SUTURE) ×1 IMPLANT
SUT SURGIDAC NAB ES-9 0 48 120 (SUTURE) IMPLANT
SUT VICRYL 0 TIES 12 18 (SUTURE) ×1 IMPLANT
SYR 20ML LL LF (SYRINGE) ×1 IMPLANT
SYR 50ML LL SCALE MARK (SYRINGE) ×1 IMPLANT
SYS KII OPTICAL ACCESS 15MM (TROCAR) ×1
SYSTEM KII OPTICAL ACCESS 15MM (TROCAR) ×1 IMPLANT
TOWEL OR 17X26 10 PK STRL BLUE (TOWEL DISPOSABLE) ×1 IMPLANT
TOWEL OR NON WOVEN STRL DISP B (DISPOSABLE) ×1 IMPLANT
TROCAR ADV FIXATION 5X100MM (TROCAR) ×1 IMPLANT
TROCAR XCEL NON-BLD 5MMX100MML (ENDOMECHANICALS) ×1 IMPLANT
TROCAR Z-THREAD OPTICAL 5X100M (TROCAR) IMPLANT
TUBING CONNECTING 10 (TUBING) ×2 IMPLANT
TUBING ENDO SMARTCAP (MISCELLANEOUS) ×1 IMPLANT

## 2022-10-18 NOTE — Anesthesia Preprocedure Evaluation (Signed)
Anesthesia Evaluation  Patient identified by MRN, date of birth, ID band Patient awake    Reviewed: Allergy & Precautions, NPO status , Patient's Chart, lab work & pertinent test results  Airway Mallampati: III  TM Distance: >3 FB Neck ROM: Full    Dental  (+) Teeth Intact, Dental Advisory Given   Pulmonary sleep apnea and Continuous Positive Airway Pressure Ventilation , Current Smoker   Pulmonary exam normal breath sounds clear to auscultation       Cardiovascular hypertension, Pt. on medications Normal cardiovascular exam Rhythm:Regular Rate:Normal     Neuro/Psych  Headaches TIA Neuromuscular disease    GI/Hepatic negative GI ROS, Neg liver ROS,,,  Endo/Other  diabetes, Type 2, Oral Hypoglycemic Agents  Morbid obesity  Renal/GU negative Renal ROS     Musculoskeletal negative musculoskeletal ROS (+)    Abdominal   Peds  Hematology negative hematology ROS (+)   Anesthesia Other Findings Day of surgery medications reviewed with the patient.  Reproductive/Obstetrics                              Anesthesia Physical Anesthesia Plan  ASA: 3  Anesthesia Plan: General   Post-op Pain Management: Tylenol PO (pre-op)*   Induction: Intravenous  PONV Risk Score and Plan: 3 and Midazolam, Dexamethasone and Ondansetron  Airway Management Planned: Oral ETT  Additional Equipment:   Intra-op Plan:   Post-operative Plan: Extubation in OR  Informed Consent: I have reviewed the patients History and Physical, chart, labs and discussed the procedure including the risks, benefits and alternatives for the proposed anesthesia with the patient or authorized representative who has indicated his/her understanding and acceptance.     Dental advisory given  Plan Discussed with: CRNA  Anesthesia Plan Comments:          Anesthesia Quick Evaluation

## 2022-10-18 NOTE — Op Note (Signed)
10/18/2022 Travis Powell 03-Nov-1970 [id number]   PRE-OPERATIVE DIAGNOSIS:  Severe obesity (bmi 49)  Hyperlipidemia, unspecified hyperlipidemia type  OSA (obstructive sleep apnea)  Unilateral osteoarthritis of knee  Essential (primary) hypertension  Old pontine infarct without late effect  Diabetes mellitus type 2 in obese  Vitamin D deficiency    POST-OPERATIVE DIAGNOSIS:  same  PROCEDURE:  Procedure(s): LAPAROSCOPIC SLEEVE GASTRECTOMY  UPPER GI ENDOSCOPY Laparoscopic bilateral TAP block  SURGEON:  Surgeon(s): Gayland Curry, MD FACS FASMBS  ASSISTANTS: Romana Juniper MD FACS  ANESTHESIA:   general  DRAINS: none   BOUGIE: 40 fr ViSiGi  LOCAL MEDICATIONS USED:   Exparel  EBL: minimal  SPECIMEN:  Source of Specimen:  Greater curvature of stomach  DISPOSITION OF SPECIMEN:  PATHOLOGY  COUNTS:  YES  INDICATION FOR PROCEDURE: This is a very pleasant 52 y.o.-year-old morbidly obese male who has had unsuccessful attempts for sustained weight loss. The patient presents today for a planned laparoscopic sleeve gastrectomy with upper endoscopy. We have discussed the risk and benefits of the procedure extensively preoperatively. Please see my separate notes.  PROCEDURE: After obtaining informed consent and receiving 5000 units of subcutaneous heparin, the patient was brought to the operating room at [facility] and placed supine on the operating room table. General endotracheal anesthesia was established. Sequential compression devices were placed. A orogastric tube was placed. The patient's abdomen was prepped and draped in the usual standard surgical fashion. The patient received preoperative IV antibiotic. A surgical timeout was performed. ERAS protocol used.   Access to the abdomen was achieved using a 5 mm 0 laparoscope thru a 5 mm trocar In the left upper Quadrant 2 fingerbreadths below the left subcostal margin using the Optiview technique. Pneumoperitoneum was  smoothly established up to 15 mm of mercury. The laparoscope was advanced and the abdominal cavity was surveilled. The patient was then placed in reverse Trendelenburg.   A 5 mm trocar was placed slightly above and to the left of the umbilicus under direct visualization.  The [facility] liver retractor was placed under the left lobe of the liver through a 5 mm trocar incision site in the subxiphoid position. A 5 mm trocar was placed in the lateral right upper quadrant along with a 15 mm trocar in the mid right abdomen. A final 5 mm trocar was placed in the lateral LUQ.  All under direct visualization after exparel had been infiltrated in bilateral lateral upper abdominal walls as a TAP block for postoperative pain relief.  The stomach was inspected. It was completely decompressed and the orogastric tube was removed.  There was no anterior dimple that was obviously visible. Preop Upper GI showed no hiatal hernia.   We identified the pylorus and measured 6 cm proximal to the pylorus and identified an area of where we would start taking down the short gastric vessels. Harmonic scalpel was used to take down the short gastric vessels along the greater curvature of the stomach. We were able to enter the lesser sac. We continued to march along the greater curvature of the stomach taking down the short gastrics. As we approached the gastrosplenic ligament we took care in this area not to injure the spleen. We were able to take down the entire gastrosplenic ligament. We then mobilized the fundus away from the left crus of diaphragm. There were not any significant posterior gastric avascular attachments. This left the stomach completely mobilized. No vessels had been taken down along the lesser curvature of the stomach.  We then reidentified the pylorus. A 40Fr ViSiGi was then placed in the oropharynx and advanced down into the stomach and placed in the distal antrum and positioned along the lesser curvature. It was  placed under suction which secured the 40Fr ViSiGi in place along the lesser curve. Then using the Ethicon echelon 60 mm stapler with a green load with ethicon staple line reinforcement (ESLR), I placed a stapler along the antrum approximately 5 cm from the pylorus. The stapler was angled so that there is ample room at the angularis incisura. I then fired the first staple load after inspecting it posteriorly to ensure adequate space both anteriorly and posteriorly. At this point I still was not completely past the angularis so with a gold load with ESLR, I placed the stapler in position just inside the prior stapleline. We then rotated the stomach to insure that there was adequate anteriorly as well as posteriorly. The stapler was then fired.  At this point I started using 60 mm blue load staple cartridges with ESLR. The echelon stapler was then repositioned with a 60 mm blue load with ESLR and we continued to march up along the [place]. My assistant was holding traction along the greater curvature stomach along the cauterized short gastric vessels ensuring that the stomach was symmetrically retracted. Prior to each firing of the staple, we rotated the stomach to ensure that there is adequate stomach left.  As we approached the fundus, I used 60 mm blue cartridge with ESLR aiming  lateral to the GE junction after mobilizing some of the esophageal fat pad. The stomach was still tethered with about 4 mm of tissue so a final 60 blue load without reinforcement was used to finish the sleeve. The sleeve was inspected. There is no evidence of cork screw. The staple line appeared hemostatic except a small amount of ooze along the first staple line in distal stomach. Pneumo reduced to 33mHg. The CRNA inflated the ViSiGi to the green zone and the upper abdomen was flooded with saline. There were no bubbles. The sleeve was decompressed and the ViSiGi removed.  Pneumoperitoneum was reduced to 8 mmHg so we can inspect for  bleeding along the staple line.  A total of 7 clips were placed along the first two staple lines for hemostasis. My assistant scrubbed out and performed an upper endoscopy. The sleeve easily distended with air and the scope was easily advanced to the pylorus. There is no evidence of internal bleeding or cork screwing. There was no narrowing at the angularis. There is no evidence of bubbles. Please see her operative note for further details. The gastric sleeve was decompressed and the endoscope was removed.  The greater curvature the stomach was grasped with a laparoscopic grasper and removed from the 15 mm trocar site.  The liver retractor was removed. I then closed the 15 mm trocar site with 1 interrupted 0 Vicryl sutures through the fascia using the endoclose. The closure was viewed laparoscopically and it was airtight. Remaining Exparel was then infiltrated in the preperitoneal spaces around the trocar sites. Pneumoperitoneum was released. All trocar sites were closed with a 4-0 Monocryl in a subcuticular fashion followed by the application of steri-strips, and bandaids. The patient was extubated and taken to the recovery room in stable condition. All needle, instrument, and sponge counts were correct x2. There are no immediate complications  (1) 60 mm green with ESLR (1) 60 mm gold with ESLR (4) 60 mm blue with 3 ESLR 7 clips  along first two staple lines in distal sleeve  PLAN OF CARE: Admit to inpatient   PATIENT DISPOSITION:  PACU - hemodynamically stable.   Delay start of Pharmacological VTE agent (>24hrs) due to surgical blood loss or risk of bleeding:  no  Leighton Ruff. Redmond Pulling, MD, FACS FASMBS General, Bariatric, & Minimally Invasive Surgery [facility], [place]

## 2022-10-18 NOTE — H&P (Signed)
PROVIDER: Kaylynne Andres Leanne Chang, MD  MRN: [id number] DOB: 10-15-1970 DATE OF ENCOUNTER: 10/07/2022 Subjective  Chief Complaint: Follow-up (Return weight loss pre op)   History of Present Illness: Travis Powell is a 52 y.o. male who is seen today office consultation at the request of Dr. Pasty Arch for evaluation follow-up regarding his severe obesity and related comorbidities. I initially met him in June to discuss bariatric surgery. He has completed the bariatric surgery evaluation process. Last seen early November at which time we discovered he had had a CVA at the end of August. .  His comorbidities include hypertension, obstructive sleep apnea on CPAP, diabetes mellitus type 2 not on insulin, right knee osteoarthritis and hyperlipidemia, CVA 03/2022  He denies any significant medical changes since I last saw him in November. He did get a repeat home sleep test which confirmed severe obstructive sleep apnea. He has had sleep apnea for many years but his machine broke last winter. He is back on CPAP. He denies any TIAs, amaurosis fugax, unilateral weakness, numbness or tingling. Reports that he is taking his blood pressure medication.  Review of Systems: A complete review of systems was obtained from the patient. I have reviewed this information and discussed as appropriate with the patient. See HPI as well for other ROS.  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 losartan-hydroCHLOROthiazide (HYZAAR) 100-25 mg tablet metFORMIN (GLUCOPHAGE) 500 MG tablet TAKE 1 TABLET BY MOUTH 2 TIMES DAILY WITH A MEAL. ergocalciferol, vitamin D2, 1,250 mcg (50,000 unit) capsule Take 1 capsule (50,000 Units total) by mouth once a week for 30 days 4 capsule 0  No current facility-administered medications on file prior to visit.  Family History Problem Relation Age of Onset High blood pressure (Hypertension) Mother Diabetes Father Deep vein thrombosis (DVT or abnormal blood clot formation) Father High blood pressure (Hypertension) Father Diabetes Brother High blood pressure (Hypertension) Brother   Social History  Tobacco Use Smoking Status Never Smokeless Tobacco Never   Social History  Socioeconomic History Marital status: Married Tobacco Use Smoking status: Never Smokeless tobacco: Never Vaping Use Vaping Use: Never used Substance and Sexual Activity Alcohol use: Yes Drug use: Never  Objective:  Vitals: 10/07/22 0834 BP: (!) 140/88 Pulse: 74 Temp: 36.7 C (98 F) SpO2: 99% Weight: (!) 161 kg (355 lb) Height: 180.3 cm ('5\' 11"'$ )  Body mass index is 49.51 kg/m.  Gen: alert, NAD, non-toxic appearing severe obesity Pupils: equal, no scleral icterus Pulm: Lungs clear to auscultation, symmetric chest rise CV: regular rate and rhythm Abd: soft, nontender, nondistended. . No cellulitis. No incisional hernia Ext: no edema, Skin: no rash, no jaundice  Labs, Imaging and Diagnostic Testing:  Labs from September 1, [id number] Basic metabolic panel normal with exception of potassium slightly low at 3.3, blood glucose elevated at 162, calcium 8.5 CBC within normal limits Hemoglobin A1C 7.7 Urine drug screen - August 31- negative  Cologuard 03/22/22- negative  Lipid panel 03/11/22 -  TC 271, LCL 209, TG 105, HDL 43  Labs February 08, 2022-normal CBC H. pylori IgG positive at 9.4, IgM less than 9, IgA 28.5 Reviewed lipid panel, hemoglobin A1c, iron panel-iron 70, normal TSH, vitamin D level low at 9  Neurology/sleep medicine office note May 11, 2022  Upper GI June 02, 2022-normal  Echocardiogram April 16, 2022 IMPRESSIONS  1. Left ventricular ejection fraction, by estimation, is 60 to 65%. The left ventricle has normal function. Left ventricular endocardial border not optimally defined to evaluate regional wall motion. There is moderate left ventricular hypertrophy. Left ventricular diastolic parameters are consistent with Grade I diastolic dysfunction (impaired relaxation). 2. Right ventricular systolic function is normal. The right ventricular size is normal. Tricuspid regurgitation signal is inadequate for assessing PA pressure. 3. The mitral valve is grossly normal. Trivial mitral valve regurgitation. No evidence of mitral stenosis. 4. The aortic valve is tricuspid. Aortic valve regurgitation is not visualized. No aortic stenosis is present.  Conclusion(s)/Recommendation(s): No intracardiac source of embolism detected on this transthoracic study. Consider a transesophageal echocardiogram to exclude cardiac source of embolism if clinically indicated.  MRI brain September 1 IMPRESSION: 1. Small acute right pontine infarcts without hemorrhage or mass effect. 2. Small remote frontal lobe infarcts  CTA head 04/16/22 IMPRESSION: 1. Stable CT appearance of the brain since yesterday. Left corona radiata ischemia has progressed since 2015, but is favored to be chronic. Brain MRI is pending at this time. 2. CTA Head and Neck limited due to venous dominant contrast timing. Negative for large vessel occlusion. At least moderate Right ICA siphon atherosclerotic stenosis is suspected. But all other vessel detail is limited.  Carotid duplex 04/16/22 Summary: Right  Carotid: Velocities in the right ICA are consistent with a 1-39% stenosis. Left Carotid: Velocities in the left ICA are consistent with a 1-39% stenosis. Vertebrals: Bilateral vertebral arteries demonstrate antegrade flow. Subclavians: Normal flow hemodynamics were seen in bilateral subclavian arteries.  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 We have had a previous discussion with his stroke neurologist who recommended waiting until March for bariatric surgery. The patient has not had any additional neurologic events. We rediscussed that he is at slight increased risk for recurrent neurologic events since he has had 1 in the past. Patient understands this and wishes to proceed. We rediscussed the importance of the preoperative meal plan. We rediscussed the typical postoperative course. We rediscussed the typical hospitalization. He read over the surgical consent form and signed. We will have the bariatric team reach out to him and give him a little bit of refresher on the preoperative education.  This patient encounter took 28 minutes today to perform the following: take history, perform exam, review outside records, interpret imaging, counsel the patient on their diagnosis and document encounter, findings & plan in the EHR  No follow-ups on file.  Leighton Ruff. Redmond Pulling MD FACS General, Minimally Invasive,  & Bariatric Surgery Electronically  signed by Rudean Curt, MD at 10/07/2022 8:54 AM EST

## 2022-10-18 NOTE — Progress Notes (Addendum)
Patient seen in PACU after surgery Alert and oriented. Discussed measures to help with pain and nausea control.  BSTOP education provided including BSTOP information guide, "Guide for Pain Management after your Bariatric Procedure".  Diet progression education provided including "Bariatric Surgery Post-Op Food Plan Phase 1: Liquids. No further questions at this time.  Will continue to partner with bedside RN and follow up with patient per protocol after arrival to the floor.     Thank you,  Calton Dach, RN, MSN Bariatric Nurse Coordinator [phone] (office)

## 2022-10-18 NOTE — Interval H&P Note (Signed)
History and Physical Interval Note:  10/18/2022 11:26 AM  Travis Powell  has presented today for surgery, with the diagnosis of MORBID OBESITY.  The various methods of treatment have been discussed with the patient and family. After consideration of risks, benefits and other options for treatment, the patient has consented to  Procedure(s): LAPAROSCOPIC SLEEVE GASTRECTOMY (N/A) UPPER GI ENDOSCOPY (N/A) as a surgical intervention.  The patient's history has been reviewed, patient examined, no change in status, stable for surgery.  I have reviewed the patient's chart and labs.  Questions were answered to the patient's satisfaction.     Greer Pickerel

## 2022-10-18 NOTE — Progress Notes (Signed)
PHARMACY CONSULT FOR:  Risk Assessment for Post-Discharge VTE Following Bariatric Surgery  Post-Discharge VTE Risk Assessment: This patient's probability of 30-day post-discharge VTE is increased due to the factors marked: x Sleeve gastrectomy   Liver disorder (transplant, cirrhosis, or nonalcoholic steatohepatitis)   Hx of VTE   Hemorrhage requiring transfusion   GI perforation, leak, or obstruction   ====================================================  x  Male    Age >/=60 years    BMI >/=50 kg/m2    CHF    Dyspnea at Rest    Paraplegia   x Non-gastric-band surgery    Operation Time >/=3 hr    Return to OR     Length of Stay >/= 3 d   Hypercoagulable condition   Significant venous stasis      Predicted probability of 30-day post-discharge VTE: 0.31%  Other patient-specific factors to consider:   Recommendation for Discharge: No pharmacologic prophylaxis post-discharge   Travis Powell is a 52 y.o. male who underwent  LAPAROSCOPIC SLEEVE GASTRECTOMY UPPER GI ENDOSCOPY on 10/18/2022   Case start: 1206 Case end: 1310   No Known Allergies  Patient Measurements: Height: '5\' 11"'$  (180.3 cm) Weight: (!) 159 kg (350 lb 9.6 oz) IBW/kg (Calculated) : 75.3 Body mass index is 48.9 kg/m.  No results for input(s): "WBC", "HGB", "HCT", "PLT", "APTT", "CREATININE", "LABCREA", "CREAT24HRUR", "MG", "PHOS", "ALBUMIN", "PROT", "AST", "ALT", "ALKPHOS", "BILITOT", "BILIDIR", "IBILI" in the last 72 hours. Estimated Creatinine Clearance: 119.5 mL/min (by C-G formula based on SCr of 1.1 mg/dL).    Past Medical History:  Diagnosis Date   Abdominal lymphadenopathy 06/21/2012   Borderline abdominal/pelvic adenopathy seen incidentally on CT scan 2011.  No change follow up 10/13   DM2 (diabetes mellitus, type 2) ([place]) 06/21/2012   History of kidney stones    HTN (hypertension), benign 06/21/2012   Left lumbar radiculopathy 06/21/2012   Leukocytosis 06/21/2012   High normal @ 10,000   41 poly, 46% lymphs stable over time 6// to 10/13 no anemia or thrombocytopenia   Migraine    Neuropathy    Obesity    Sleep apnea    uses CPAP at night   Stroke [facility])    TIA in 04/2022     Medications Prior to Admission  Medication Sig Dispense Refill Last Dose   amLODipine (NORVASC) 10 MG tablet TAKE 1 TABLET (10 MG TOTAL) BY MOUTH EVERY MORNING. 90 tablet 1 10/18/2022 at 0700   aspirin EC 81 MG tablet Take 1 tablet (81 mg total) by mouth daily. Swallow whole. 30 tablet 12 Past Week   atorvastatin (LIPITOR) 40 MG tablet Take 40 mg by mouth daily.    at 0700   losartan-hydrochlorothiazide (HYZAAR) 100-12.5 MG tablet Take 1 tablet by mouth daily. 60 tablet 2 10/17/2022   metFORMIN (GLUCOPHAGE) 1000 MG tablet TAKE 1 TABLET (1,000 MG TOTAL) BY MOUTH TWICE A DAY WITH FOOD 180 tablet 2 10/17/2022   MOUNJARO 2.5 MG/0.5ML Pen INJECT 2.5 MG SUBCUTANEOUSLY WEEKLY 6 mL 1 Past Month   spironolactone (ALDACTONE) 25 MG tablet Take 1 tablet (25 mg total) by mouth daily. 30 tablet 11 10/17/2022   atorvastatin (LIPITOR) 80 MG tablet TAKE 1 TABLET BY MOUTH EVERY DAY (Patient not taking: Reported on 10/08/2022) 30 tablet 3 Not Taking     Peggyann Juba, PharmD, BCPS Pharmacy: [phone] 10/18/2022,2:08 PM

## 2022-10-18 NOTE — Transfer of Care (Signed)
Immediate Anesthesia Transfer of Care Note  Patient: Travis Powell  Procedure(s) Performed: LAPAROSCOPIC SLEEVE GASTRECTOMY UPPER GI ENDOSCOPY  Patient Location: PACU  Anesthesia Type:General  Level of Consciousness: awake, drowsy, patient cooperative, and responds to stimulation  Airway & Oxygen Therapy: Patient Spontanous Breathing and Patient connected to face mask oxygen  Post-op Assessment: Report given to RN and Post -op Vital signs reviewed and stable  Post vital signs: Reviewed and stable  Last Vitals:  Vitals Value Taken Time  BP 133/64 10/18/22 1318  Temp    Pulse 87 10/18/22 1320  Resp 16 10/18/22 1320  SpO2 100 % 10/18/22 1320  Vitals shown include unvalidated device data.  Last Pain:  Vitals:   10/18/22 0915  TempSrc:   PainSc: 0-No pain         Complications: No notable events documented.

## 2022-10-18 NOTE — Anesthesia Procedure Notes (Signed)
Procedure Name: Intubation Date/Time: 10/18/2022 11:41 AM  Performed by: Lollie Sails, CRNAPre-anesthesia Checklist: Patient identified, Emergency Drugs available, Suction available, Patient being monitored and Timeout performed Patient Re-evaluated:Patient Re-evaluated prior to induction Oxygen Delivery Method: Circle system utilized Preoxygenation: Pre-oxygenation with 100% oxygen Induction Type: IV induction Ventilation: Mask ventilation without difficulty Laryngoscope Size: Miller and 3 Grade View: Grade III Tube type: Oral Tube size: 7.5 mm Number of attempts: 1 Airway Equipment and Method: Stylet Placement Confirmation: ETT inserted through vocal cords under direct vision, positive ETCO2 and breath sounds checked- equal and bilateral Secured at: 24 cm Tube secured with: Tape Dental Injury: Teeth and Oropharynx as per pre-operative assessment  Future Recommendations: Recommend- induction with short-acting agent, and alternative techniques readily available

## 2022-10-18 NOTE — Op Note (Signed)
Preoperative diagnosis: laparoscopic sleeve gastrectomy  Postoperative diagnosis: Same   Procedure: Upper endoscopy   Surgeon: Clovis Riley, M.D.  Anesthesia: Gen.   Description of procedure: The endoscope was placed in the mouth and oropharynx and under endoscopic vision it was advanced to the esophagogastric junction which was identified at 41cm from the teeth.  The pouch was tensely insufflated while the upper abdomen was flooded with irrigation to perform a leak test, which was negative. No bubbles were seen.  The staple line was hemostatic and the lumen was evenly tubular without undue narrowing, angulation or twisting specifically at the incisura angularis. The lumen was decompressed and the scope was withdrawn without difficulty.    Clovis Riley, M.D. General, Bariatric, & Minimally Invasive Surgery [facility]

## 2022-10-19 LAB — GLUCOSE, CAPILLARY
Glucose-Capillary: 144 mg/dL — ABNORMAL HIGH (ref 70–99)
Glucose-Capillary: 164 mg/dL — ABNORMAL HIGH (ref 70–99)
Glucose-Capillary: 185 mg/dL — ABNORMAL HIGH (ref 70–99)
Glucose-Capillary: 196 mg/dL — ABNORMAL HIGH (ref 70–99)
Glucose-Capillary: 204 mg/dL — ABNORMAL HIGH (ref 70–99)

## 2022-10-19 LAB — CBC WITH DIFFERENTIAL/PLATELET
Abs Immature Granulocytes: 0.09 10*3/uL — ABNORMAL HIGH (ref 0.00–0.07)
Basophils Absolute: 0 10*3/uL (ref 0.0–0.1)
Basophils Relative: 0 %
Eosinophils Absolute: 0 10*3/uL (ref 0.0–0.5)
Eosinophils Relative: 0 %
HCT: 40.9 % (ref 39.0–52.0)
Hemoglobin: 13.1 g/dL (ref 13.0–17.0)
Immature Granulocytes: 1 %
Lymphocytes Relative: 16 %
Lymphs Abs: 2.3 10*3/uL (ref 0.7–4.0)
MCH: 27.7 pg (ref 26.0–34.0)
MCHC: 32 g/dL (ref 30.0–36.0)
MCV: 86.5 fL (ref 80.0–100.0)
Monocytes Absolute: 0.9 10*3/uL (ref 0.1–1.0)
Monocytes Relative: 6 %
Neutro Abs: 11.5 10*3/uL — ABNORMAL HIGH (ref 1.7–7.7)
Neutrophils Relative %: 77 %
Platelets: 313 10*3/uL (ref 150–400)
RBC: 4.73 MIL/uL (ref 4.22–5.81)
RDW: 14.5 % (ref 11.5–15.5)
WBC: 14.8 10*3/uL — ABNORMAL HIGH (ref 4.0–10.5)
nRBC: 0 % (ref 0.0–0.2)

## 2022-10-19 LAB — COMPREHENSIVE METABOLIC PANEL
ALT: 42 U/L (ref 0–44)
AST: 42 U/L — ABNORMAL HIGH (ref 15–41)
Albumin: 4.1 g/dL (ref 3.5–5.0)
Alkaline Phosphatase: 64 U/L (ref 38–126)
Anion gap: 12 (ref 5–15)
BUN: 16 mg/dL (ref 6–20)
CO2: 20 mmol/L — ABNORMAL LOW (ref 22–32)
Calcium: 9.1 mg/dL (ref 8.9–10.3)
Chloride: 101 mmol/L (ref 98–111)
Creatinine, Ser: 1.02 mg/dL (ref 0.61–1.24)
GFR, Estimated: >60 mL/min (ref >60)
Glucose, Bld: 209 mg/dL — ABNORMAL HIGH (ref 70–99)
Potassium: 4.5 mmol/L (ref 3.5–5.1)
Sodium: 133 mmol/L — ABNORMAL LOW (ref 135–145)
Total Bilirubin: 0.7 mg/dL (ref 0.3–1.2)
Total Protein: 8.2 g/dL — ABNORMAL HIGH (ref 6.5–8.1)

## 2022-10-19 MED ORDER — TRAMADOL HCL 50 MG PO TABS: 50.0000 mg | ORAL_TABLET | Freq: Four times a day (QID) | ORAL | 0 refills | Status: DC | PRN

## 2022-10-19 MED ORDER — PANTOPRAZOLE SODIUM 40 MG PO TBEC: 40.0000 mg | DELAYED_RELEASE_TABLET | Freq: Every day | ORAL | 0 refills | Status: DC

## 2022-10-19 MED ORDER — ONDANSETRON 4 MG PO TBDP: 4.0000 mg | ORAL_TABLET | Freq: Four times a day (QID) | ORAL | 0 refills | Status: DC | PRN

## 2022-10-19 MED ORDER — ACETAMINOPHEN 500 MG PO TABS: 1000.0000 mg | ORAL_TABLET | Freq: Three times a day (TID) | ORAL | 0 refills | Status: AC

## 2022-10-19 NOTE — Progress Notes (Signed)
Patient was given dc instructions and all questions were answered. Patient was taken to main entrance by NT.

## 2022-10-19 NOTE — Anesthesia Postprocedure Evaluation (Signed)
Anesthesia Post Note  Patient: Travis Powell  Procedure(s) Performed: LAPAROSCOPIC SLEEVE GASTRECTOMY UPPER GI ENDOSCOPY     Patient location during evaluation: PACU Anesthesia Type: General Level of consciousness: oriented, awake and alert and awake Pain management: pain level controlled Vital Signs Assessment: post-procedure vital signs reviewed and stable Respiratory status: spontaneous breathing, nonlabored ventilation, respiratory function stable and patient connected to nasal cannula oxygen Cardiovascular status: blood pressure returned to baseline and stable Postop Assessment: no headache, no backache and no apparent nausea or vomiting Anesthetic complications: no   No notable events documented.  Last Vitals:  Vitals:   10/19/22 0832 10/19/22 1132  BP: (!) 147/85 (!) 152/78  Pulse: 89 88  Resp: 18 18  Temp: 36.8 C 36.7 C  SpO2: 98% 100%    Last Pain:  Vitals:   10/19/22 1132  TempSrc: Oral  PainSc:                  Santa Lighter

## 2022-10-19 NOTE — Progress Notes (Signed)
Patient alert and oriented, pain is controlled. Patient is tolerating fluids, advanced to protein shake today, patient is tolerating well. Reviewed Gastric sleeve discharge instructions with patient and patient is able to articulate understanding. Provided information on BELT program, Support Group and [facility] outpatient pharmacy. Communicated general update of patient status to surgeon. All questions answered. 24hr fluid recall is 210 mL per hydration protocol, bariatric nurse coordinator to make follow-up phone call within one week.   Thank you,  Calton Dach, RN, MSN Bariatric Nurse Coordinator [phone] (office)

## 2022-10-19 NOTE — Progress Notes (Signed)
RT note: Pt. placed on CPAP (DS) at this time, tolerating well, made aware to notify if needed.

## 2022-10-19 NOTE — Plan of Care (Signed)
Problem: Education: Goal: Knowledge of General Education information will improve Description: Including pain rating scale, medication(s)/side effects and non-pharmacologic comfort measures Outcome: Adequate for Discharge   Problem: Health Behavior/Discharge Planning: Goal: Ability to manage health-related needs will improve Outcome: Adequate for Discharge   Problem: Clinical Measurements: Goal: Ability to maintain clinical measurements within normal limits will improve Outcome: Adequate for Discharge Goal: Will remain free from infection Outcome: Adequate for Discharge Goal: Diagnostic test results will improve Outcome: Adequate for Discharge Goal: Respiratory complications will improve Outcome: Adequate for Discharge Goal: Cardiovascular complication will be avoided Outcome: Adequate for Discharge   Problem: Activity: Goal: Risk for activity intolerance will decrease Outcome: Adequate for Discharge   Problem: Nutrition: Goal: Adequate nutrition will be maintained Outcome: Adequate for Discharge   Problem: Coping: Goal: Level of anxiety will decrease Outcome: Adequate for Discharge   Problem: Elimination: Goal: Will not experience complications related to bowel motility Outcome: Adequate for Discharge Goal: Will not experience complications related to urinary retention Outcome: Adequate for Discharge   Problem: Pain Managment: Goal: General experience of comfort will improve Outcome: Adequate for Discharge   Problem: Safety: Goal: Ability to remain free from injury will improve Outcome: Adequate for Discharge   Problem: Skin Integrity: Goal: Risk for impaired skin integrity will decrease Outcome: Adequate for Discharge   Problem: Education: Goal: Ability to describe self-care measures that may prevent or decrease complications (Diabetes Survival Skills Education) will improve Outcome: Adequate for Discharge Goal: Individualized Educational Video(s) Outcome:  Adequate for Discharge   Problem: Coping: Goal: Ability to adjust to condition or change in health will improve Outcome: Adequate for Discharge   Problem: Fluid Volume: Goal: Ability to maintain a balanced intake and output will improve Outcome: Adequate for Discharge   Problem: Health Behavior/Discharge Planning: Goal: Ability to identify and utilize available resources and services will improve Outcome: Adequate for Discharge Goal: Ability to manage health-related needs will improve Outcome: Adequate for Discharge   Problem: Metabolic: Goal: Ability to maintain appropriate glucose levels will improve Outcome: Adequate for Discharge   Problem: Nutritional: Goal: Maintenance of adequate nutrition will improve Outcome: Adequate for Discharge Goal: Progress toward achieving an optimal weight will improve Outcome: Adequate for Discharge   Problem: Skin Integrity: Goal: Risk for impaired skin integrity will decrease Outcome: Adequate for Discharge   Problem: Tissue Perfusion: Goal: Adequacy of tissue perfusion will improve Outcome: Adequate for Discharge   Problem: Education: Goal: Ability to state signs and symptoms to report to health care provider will improve Outcome: Adequate for Discharge Goal: Knowledge of the prescribed self-care regimen will improve Outcome: Adequate for Discharge Goal: Knowledge of discharge needs will improve Outcome: Adequate for Discharge   Problem: Activity: Goal: Ability to tolerate increased activity will improve Outcome: Adequate for Discharge   Problem: Bowel/Gastric: Goal: Gastrointestinal status for postoperative course will improve Outcome: Adequate for Discharge Goal: Occurrences of nausea will decrease Outcome: Adequate for Discharge   Problem: Coping: Goal: Development of coping mechanisms to deal with changes in body function or appearance will improve Outcome: Adequate for Discharge   Problem: Fluid Volume: Goal:  Maintenance of adequate hydration will improve Outcome: Adequate for Discharge   Problem: Nutritional: Goal: Nutritional status will improve Outcome: Adequate for Discharge   Problem: Clinical Measurements: Goal: Will show no signs or symptoms of venous thromboembolism Outcome: Adequate for Discharge Goal: Will remain free from infection Outcome: Adequate for Discharge Goal: Will show no signs of GI Leak Outcome: Adequate for Discharge   Problem: Respiratory:  Goal: Will regain and/or maintain adequate ventilation Outcome: Adequate for Discharge   Problem: Pain Management: Goal: Pain level will decrease Outcome: Adequate for Discharge   Problem: Skin Integrity: Goal: Demonstration of wound healing without infection will improve Outcome: Adequate for Discharge

## 2022-10-19 NOTE — Discharge Instructions (Signed)

## 2022-10-19 NOTE — TOC CM/SW Note (Signed)
  Transition of Care [facility]) Screening Note   Patient Details  Name: Travis Powell Date of Birth: 15-Jan-1971   Transition of Care [facility]) CM/SW Contact:    Lennart Pall, LCSW Phone Number: 10/19/2022, 11:00 AM    Transition of Care Department [facility]) has reviewed patient and no TOC needs have been identified at this time. We will continue to monitor patient advancement through interdisciplinary progression rounds. If new patient transition needs arise, please place a TOC consult.

## 2022-10-20 ENCOUNTER — Encounter (HOSPITAL_COMMUNITY): Payer: Self-pay | Admitting: General Surgery

## 2022-10-20 ENCOUNTER — Telehealth: Payer: Self-pay

## 2022-10-20 NOTE — Transitions of Care (Post Inpatient/ED Visit) (Signed)
   10/20/2022  Name: GE SLASKI MRN: [id number] DOB: 11-09-1970  Today's TOC FU Call Status: Today's TOC FU Call Status:: Unsuccessul Call (1st Attempt) Unsuccessful Call (1st Attempt) Date: 10/20/22  Attempted to reach the patient regarding the most recent Inpatient/ED visit.  Follow Up Plan: Additional outreach attempts will be made to reach the patient to complete the Transitions of Care (Post Inpatient/ED visit) call.   Johnney Killian, RN, BSN, CCM Care Management Coordinator [place] Phone: [phone]: [phone]

## 2022-10-21 NOTE — Discharge Summary (Signed)
Physician Discharge Summary  Travis Powell [id number] DOB: 03/08/1971 DOA: 10/18/2022  PCP: [name], MD  Admit date: 10/18/2022 Discharge date: 10/19/2022  Recommendations for Outpatient Follow-up:    Follow-up Information     Greer Pickerel, MD. Go on 11/17/2022.   Specialty: General Surgery Why: Please arrive 15 minutes prior to your appointment at 8:45am Contact information: [place] [address] [phone]         Malachi Pro [place], [place]. Go on 12/14/2022.   Specialty: General Surgery Why: Please arrive 15 minutes prior to your appointment at 2:30p with Glenna Durand on behalf of Dr. Redmond Pulling. Contact information: [address] [facility] [place] [address] [phone]         [facility]. Go on 11/04/2022.   Why: [facility] is on every 3rd Thur. 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 Please see operative note. Preoperatively the patient was given 5000 units of subcutaneous heparin for DVT prophylaxis. Postoperative prophylactic heparin dosing  was started on the evening of postoperative day 0. ERAS protocol was used. On the evening of postoperative day 0, the patient was started on water and ice chips. On postoperative day 1 the patient had no fever or tachycardia and was tolerating water in their diet was gradually advanced throughout the day. The patient was ambulating without difficulty. Their vital signs are stable without fever or tachycardia. Their hemoglobin had remained stable. The patient was maintained on their home settings for CPAP therapy. The patient had received discharge instructions and counseling. They were deemed stable for discharge and had met discharge criteria  BP (!) 152/78 (BP Location: Left Arm)   Pulse 88   Temp 98 F (36.7 C) (Oral)   Resp 18   Ht '5\' 11"'$  (1.803 m)   Wt (!) 159 kg   SpO2 100%   BMI 48.90 kg/m   Gen: alert, NAD, non-toxic appearing Pupils: equal, no scleral icterus Pulm: Lungs clear to auscultation, symmetric chest rise CV: regular rate and rhythm Abd: soft, mild approp tender, nondistended.  No cellulitis. No incisional hernia Ext: no edema, no calf tenderness Skin: no rash, no jaundice   Discharge Instructions  Discharge Instructions     Ambulate hourly while awake   Complete by: As directed    Call MD for:  difficulty breathing, headache or visual disturbances   Complete by: As directed    Call MD for:  persistant dizziness or light-headedness   Complete by: As directed    Call MD for:  persistant nausea and vomiting   Complete by: As directed    Call MD for:  redness, tenderness,  or signs of infection (pain, swelling, redness, odor or green/yellow discharge around incision site)   Complete by: As directed    Call MD for:  severe uncontrolled pain   Complete by: As directed    Call MD for:  temperature >101 F   Complete by: As directed    Diet bariatric full liquid   Complete by: As directed    Discharge instructions   Complete by: As directed    See bariatric  discharge instructions   Incentive spirometry   Complete by: As directed    Perform hourly while awake      Allergies as of 10/19/2022   No Known Allergies      Medication List     STOP taking these medications    aspirin EC 81 MG tablet   metFORMIN 1000 MG tablet Commonly known as: GLUCOPHAGE   Mounjaro 2.5 MG/0.5ML Pen Generic drug: tirzepatide       TAKE these medications    acetaminophen 500 MG tablet Commonly known as: TYLENOL Take 2 tablets (1,000 mg total) by mouth every 8 (eight) hours for 5 days.   amLODipine 10 MG tablet Commonly known as: NORVASC TAKE 1 TABLET (10 MG TOTAL) BY MOUTH EVERY MORNING. Notes to patient: Monitor Blood Pressure Daily and keep a log for primary care physician.  Your physician may need to make changes to your medications with rapid weight loss.      atorvastatin 40 MG tablet Commonly known as: LIPITOR Take 40 mg by mouth daily. What changed: Another medication with the same name was removed. Continue taking this medication, and follow the directions you see here.   losartan-hydrochlorothiazide 100-12.5 MG tablet Commonly known as: HYZAAR Take 1 tablet by mouth daily. Notes to patient: Monitor Blood Pressure Daily and keep a log for primary care physician.  Monitor for symptoms of dehydration.  Your physician may need to make changes to your medications with rapid weight loss.      ondansetron 4 MG disintegrating tablet Commonly known as: ZOFRAN-ODT Take 1 tablet (4 mg total) by mouth every 6 (six) hours as needed for nausea or vomiting.   pantoprazole 40 MG tablet Commonly known as: PROTONIX Take 1 tablet (40 mg total) by mouth daily.   spironolactone 25 MG tablet Commonly known as: Aldactone Take 1 tablet (25 mg total) by mouth daily. Notes to patient: Monitor Blood Pressure Daily and keep a log for primary care physician.  Your physician may need to make changes to your medications with rapid weight loss.      traMADol  50 MG tablet Commonly known as: ULTRAM Take 1 tablet (50 mg total) by mouth every 6 (six) hours as needed (pain).        Follow-up Information     Greer Pickerel, MD. Go on 11/17/2022.   Specialty: General Surgery Why: Please arrive 15 minutes prior to your appointment at 8:45am Contact information: [place] [address] [phone]         Malachi Pro [place], [place]. Go on 12/14/2022.   Specialty: General Surgery Why: Please arrive 15 minutes prior to your appointment at 2:30p with Glenna Durand on behalf of Dr. Redmond Pulling. Contact information: [address] [facility] [place] [facility] [place] [address] [phone]         [facility]. Go on 11/04/2022.   Why: [facility] is on every 3rd Thur. @ 6p please plan to join online or inperson Contact information:  [employer]  [place], [place], [place]                 The results of significant diagnostics from this hospitalization (including imaging, microbiology, ancillary and laboratory) are listed below for reference.    Significant Diagnostic Studies: No results found.  Labs: Basic Metabolic Panel: Recent Labs  Lab 10/19/22 0455  NA 133*  K 4.5  CL 101  CO2 20*  GLUCOSE 209*  BUN 16  CREATININE 1.02  CALCIUM 9.1   Liver Function Tests: Recent Labs  Lab 10/19/22 0455  AST 42*  ALT 42  ALKPHOS 64  BILITOT 0.7  PROT 8.2*  ALBUMIN 4.1    CBC: Recent Labs  Lab 10/18/22 1528 10/18/22 2137 10/19/22 0455  WBC 12.1*  --  14.8*  NEUTROABS  --   --  11.5*  HGB 13.0 14.0 13.1  HCT 40.8 44.4 40.9  MCV 87.2  --  86.5  PLT 310  --  313    CBG: Recent Labs  Lab 10/19/22 0015 10/19/22 0411 10/19/22 0710 10/19/22 1130 10/19/22 1515  GLUCAP 204* 196* 185* 144* 164*    Principal Problem:   S/P laparoscopic sleeve gastrectomy   Time coordinating discharge: 15 min  Signed:  Gayland Curry, MD  [facility], [place] [phone] 10/21/2022, 7:37 AM

## 2022-10-22 ENCOUNTER — Telehealth: Payer: Self-pay

## 2022-10-22 NOTE — Transitions of Care (Post Inpatient/ED Visit) (Signed)
   10/22/2022  Name: Travis Powell MRN: [id number] DOB: Sep 20, 1970  Today's TOC FU Call Status: Today's TOC FU Call Status:: Successful TOC FU Call Competed TOC FU Call Complete Date: 10/22/22  Transition Care Management Follow-up Telephone Call Date of Discharge: 10/19/22 Discharge Facility: [facility]) Type of Discharge: Inpatient Admission Primary Inpatient Discharge Diagnosis:: s/p Laprascopic Sleeve Gastrectomy How have you been since you were released from the hospital?: Better Any questions or concerns?: No  Items Reviewed: Did you receive and understand the discharge instructions provided?: Yes Medications obtained and verified?: Yes (Medications Reviewed) Any new allergies since your discharge?: No Dietary orders reviewed?: Yes Type of Diet Ordered:: Liquid diet Do you have support at home?: Yes People in Home: spouse Name of Support/Comfort Primary Source: [place] and Equipment/Supplies: [place] Ordered?: No Any new equipment or medical supplies ordered?: No  Functional Questionnaire: Do you need assistance with bathing/showering or dressing?: No Do you need assistance with meal preparation?: No Do you need assistance with eating?: No Do you have difficulty maintaining continence: No Do you need assistance with getting out of bed/getting out of a chair/moving?: No Do you have difficulty managing or taking your medications?: No  Folllow up appointments reviewed: PCP Follow-up appointment confirmed?: Yes Date of PCP follow-up appointment?: 11/03/22 Follow-up Provider: Dr. Johnney Ou Specialist [facility] Follow-up appointment confirmed?: Yes Date of Specialist follow-up appointment?: 11/03/22 Follow-Up Specialty Provider:: Dr. Redmond Pulling Do you need transportation to your follow-up appointment?: No Do you understand care options if your condition(s) worsen?: Yes-patient verbalized understanding  SDOH Interventions Today     Flowsheet Row Most Recent Value  SDOH Interventions   Housing Interventions Intervention Not Indicated  Transportation Interventions Intervention Not Indicated      Johnney Killian, RN, BSN, CCM Care Management Coordinator [facility] Phone: [phone]: [phone]

## 2022-10-25 ENCOUNTER — Telehealth (HOSPITAL_COMMUNITY): Payer: Self-pay | Admitting: *Deleted

## 2022-10-25 LAB — SURGICAL PATHOLOGY

## 2022-10-25 NOTE — Telephone Encounter (Signed)
Left voicemail will attempt to call back

## 2022-10-26 ENCOUNTER — Telehealth (HOSPITAL_COMMUNITY): Payer: Self-pay | Admitting: *Deleted

## 2022-10-26 NOTE — Telephone Encounter (Signed)
1. Tell me about your pain and pain management?     Pt denies any pain. Pt stated he originally had taken some of the pain medication but it then was much so he took tylenol. He reported that he really hadn't had a problem with pain following surgery.   2. Let's talk about fluid intake. How much total fluid are you taking in?   Pt states that s/he is getting in at least 60oz of fluid including protein shakes, bottled water. He is working towards his fluid intake goal.  Pt encouraged to continue to work towards meeting goal. Pt instructed to assess status and suggestions daily utilizing Hydration Action Plan on discharge folder and to call [facility] if in the "red zone".       3. How much protein have you taken in the last day?   Pt states that he is working to meet the goal of 80g of protein today, he has been able to get in about 60g of protein. Pt plans to drink the reminder of goal throughout the day to meet criteria.     4. Have you had nausea? Tell me about when you have experienced nausea and what you did to help?   Pt denies nausea.   5. Has the frequency or color changed with your urine?   Pt states that s/he is urinating "fine" with no changes in frequency or urgency.   6. Tell me what your incisions look like?   "Incisions look fine".  Pt states incisions are not swollen, open, or draining. Pt encouraged to call [facility] if incisions change.    7. Have you been passing gas? BM?   Pt states that they are having BMs. Pt had a bm on Wed following surgery and has had bm each day since then  8. If a problem or question were to arise who would you call? Do you know contact numbers for [place], [facility], and [facility]?   Pt knows to call [facility] for surgical, [facility] for nutrition, and [place] for non-urgent questions or concerns. Pt denies dehydration symptoms. Pt can describe s/sx of dehydration.   9. How has the walking going?   Pt states s/he is walking around and able to be active without difficulty.    10. Are you still using your incentive spirometer? If so, how often?   Pt states that s/he is doing the I.S. Pt encouraged to use incentive spirometer, around 10x every hour while awake until s/he sees the surgeon.   11. How are your vitamins and calcium going? How are you taking them?    Pt states that s/he is taking his/her supplements and vitamins without difficulty.

## 2022-11-02 ENCOUNTER — Encounter: Payer: Self-pay | Admitting: Dietician

## 2022-11-02 ENCOUNTER — Ambulatory Visit: Payer: BC Managed Care – PPO

## 2022-11-02 ENCOUNTER — Encounter: Payer: BC Managed Care – PPO | Attending: General Surgery | Admitting: Dietician

## 2022-11-02 ENCOUNTER — Ambulatory Visit: Payer: BC Managed Care – PPO | Admitting: Neurology

## 2022-11-02 VITALS — Ht 71.0 in | Wt 330.7 lb

## 2022-11-02 DIAGNOSIS — Z6841 Body Mass Index (BMI) 40.0 and over, adult: Secondary | ICD-10-CM | POA: Insufficient documentation

## 2022-11-02 DIAGNOSIS — E1165 Type 2 diabetes mellitus with hyperglycemia: Secondary | ICD-10-CM

## 2022-11-02 DIAGNOSIS — Z713 Dietary counseling and surveillance: Secondary | ICD-10-CM | POA: Diagnosis not present

## 2022-11-02 DIAGNOSIS — E669 Obesity, unspecified: Secondary | ICD-10-CM

## 2022-11-02 NOTE — Progress Notes (Signed)
2 Week Post-Operative Nutrition Class   Patient was seen on 11/02/2022 for Post-Operative Nutrition education at the [facility].    Surgery date: 10/18/2022 Surgery type: Sleeve Gastrectomy  Anthropometrics  Start weight at [facility]: 364.8 lbs (date: 02/26/2022)  Height: 71 in Weight today: 330.7 BMI: 46.12 kg/m2     Clinical  Medical hx: hypertension, obstructive sleep apnea on CPAP, diabetes mellitus type 2, right knee osteoarthritis and hyperlipidemia Medications: Amlodipine, metformin  Labs: chol 235; LDL 161; A1C 6.7 Notable signs/symptoms: none noted Any previous deficiencies? No Bowel Habits: Every day to every other day no complaints   Body Composition Scale 11/02/2022  Current Body Weight 330.7  Total Body Fat % 38.9  Visceral Fat 35  Fat-Free Mass % 61.0   Total Body Water % 42.0  Muscle-Mass lbs 62.4  BMI 46.0  Body Fat Displacement          Torso  lbs 80.0         Left Leg  lbs 16.0         Right Leg  lbs 16.0         Left Arm  lbs 8.0         Right Arm  lbs 8.0      The following the learning objectives were met by the patient during this course: Identifies Soft Prepped Plan Advancement Guide  Identifies Soft, High Proteins (Phase 1), beginning 2 weeks post-operatively to 3 weeks post-operatively Identifies Additional Soft High Proteins, soft non-starchy vegetables, fruits and starches (Phase 2), beginning 3 weeks post-operatively to 3 months post-operatively Identifies appropriate sources of fluids, proteins, vegetables, fruits and starches Identifies appropriate fat sources and healthy verses unhealthy fat types   States protein, vegetable, fruit and starch recommendations and appropriate sources post-operatively Identifies the need for appropriate texture modifications, mastication, and bite sizes when consuming solids Identifies appropriate fat consumption and sources Identifies appropriate multivitamin and calcium sources  post-operatively Describes the need for physical activity post-operatively and will follow MD recommendations States when to call healthcare provider regarding medication questions or post-operative complications   Handouts given during class include: Soft Prepped Plan Advancement Guide Bariatric MyPlate Bariatric Multivitamin list   Follow-Up Plan: Patient will follow-up at [facility] in 10 weeks for 3 month post-op nutrition visit for diet advancement per MD.

## 2022-11-03 ENCOUNTER — Ambulatory Visit (INDEPENDENT_AMBULATORY_CARE_PROVIDER_SITE_OTHER): Payer: BC Managed Care – PPO | Admitting: Student

## 2022-11-03 VITALS — BP 155/83 | HR 78 | Ht 71.0 in | Wt 336.4 lb

## 2022-11-03 DIAGNOSIS — I6381 Other cerebral infarction due to occlusion or stenosis of small artery: Secondary | ICD-10-CM | POA: Diagnosis not present

## 2022-11-03 DIAGNOSIS — I10 Essential (primary) hypertension: Secondary | ICD-10-CM | POA: Diagnosis not present

## 2022-11-03 DIAGNOSIS — G4733 Obstructive sleep apnea (adult) (pediatric): Secondary | ICD-10-CM | POA: Diagnosis not present

## 2022-11-03 DIAGNOSIS — E782 Mixed hyperlipidemia: Secondary | ICD-10-CM

## 2022-11-03 DIAGNOSIS — Z7984 Long term (current) use of oral hypoglycemic drugs: Secondary | ICD-10-CM

## 2022-11-03 DIAGNOSIS — N529 Male erectile dysfunction, unspecified: Secondary | ICD-10-CM

## 2022-11-03 DIAGNOSIS — E1165 Type 2 diabetes mellitus with hyperglycemia: Secondary | ICD-10-CM

## 2022-11-03 DIAGNOSIS — Z0000 Encounter for general adult medical examination without abnormal findings: Secondary | ICD-10-CM

## 2022-11-03 DIAGNOSIS — Z1211 Encounter for screening for malignant neoplasm of colon: Secondary | ICD-10-CM

## 2022-11-03 DIAGNOSIS — Z9884 Bariatric surgery status: Secondary | ICD-10-CM

## 2022-11-03 MED ORDER — METFORMIN HCL 1000 MG PO TABS: 1000.0000 mg | ORAL_TABLET | Freq: Two times a day (BID) | ORAL | 2 refills | Status: DC

## 2022-11-03 MED ORDER — LOSARTAN POTASSIUM-HCTZ 100-12.5 MG PO TABS: 1.0000 | ORAL_TABLET | Freq: Every day | ORAL | 2 refills | Status: DC

## 2022-11-03 MED ORDER — SILDENAFIL CITRATE 50 MG PO TABS: 50.0000 mg | ORAL_TABLET | ORAL | 1 refills | Status: DC | PRN

## 2022-11-03 NOTE — Progress Notes (Signed)
CC: follow up after gastric sleeve procedure  HPI:  [name] is a 52 y.o. male living with a history stated below and presents today for follow up s/p gastric sleeve. Please see problem based assessment and plan for additional details.  Past Medical History:  Diagnosis Date   Abdominal lymphadenopathy 06/21/2012   Borderline abdominal/pelvic adenopathy seen incidentally on CT scan 2011.  No change follow up 10/13   DM2 (diabetes mellitus, type 2) ([place]) 06/21/2012   History of kidney stones    HTN (hypertension), benign 06/21/2012   Left lumbar radiculopathy 06/21/2012   Leukocytosis 06/21/2012   High normal @ 10,000  41 poly, 46% lymphs stable over time 6// to 10/13 no anemia or thrombocytopenia   Migraine    Neuropathy    Obesity    Sleep apnea    uses CPAP at night   Stroke [facility])    TIA in 04/2022    Current Outpatient Medications on File Prior to Visit  Medication Sig Dispense Refill   amLODipine (NORVASC) 10 MG tablet TAKE 1 TABLET (10 MG TOTAL) BY MOUTH EVERY MORNING. 90 tablet 1   atorvastatin (LIPITOR) 40 MG tablet Take 40 mg by mouth daily.     ondansetron (ZOFRAN-ODT) 4 MG disintegrating tablet Take 1 tablet (4 mg total) by mouth every 6 (six) hours as needed for nausea or vomiting. 20 tablet 0   pantoprazole (PROTONIX) 40 MG tablet Take 1 tablet (40 mg total) by mouth daily. 90 tablet 0   spironolactone (ALDACTONE) 25 MG tablet Take 1 tablet (25 mg total) by mouth daily. 30 tablet 11   traMADol (ULTRAM) 50 MG tablet Take 1 tablet (50 mg total) by mouth every 6 (six) hours as needed (pain). 10 tablet 0   No current facility-administered medications on file prior to visit.    Review of Systems: ROS negative except for what is noted on the assessment and plan.  Vitals:   11/03/22 0915 11/03/22 0950  BP: (!) 167/106 (!) 155/83  Pulse: 83 78  SpO2: 100%   Weight: (!) 336 lb 6.4 oz (152.6 kg)   Height: 5\' 11"  (1.803 m)     Physical  Exam: Constitutional: well-appearing, in no acute distress HENT: normocephalic atraumatic, mucous membranes moist Eyes: conjunctiva non-erythematous Neck: supple Pulmonary/Chest: normal work of breathing on room air Abdominal: soft, non-tender, non-distended. 6 surgical incision sites, clean and dry. No erythema or tenderness.  MSK: normal bulk and tone GU: testicles normal size and without evidence of penile fibrosis Neurological: alert & oriented x 3 Skin: warm and dry Psych: normal mood  Nursing staff present during testicular exam  Assessment & Plan:   HTN (hypertension), benign Assessment: Current regimen of losartan-hctz 100-12.5 mg, spironolactone 25 mg, and amlodipine 10 mg daily. He did not take his losartan-hctz this morning, refill sent in. Recommend purchasing a home blood pressure cuff and recording daily. If persistently elevated can increase HCTZ dosing. He has been adherent to his cpap.   Plan: - continue regimen as per above - follow up for BP check in 1 month, bring in log   CVA (cerebral vascular accident) [facility]) Assessment: Aspirin discontinued prior to gastric sleeve procedure on 10/18/22. Recommended resuming his aspirin. Last LDL not at goal for secondary prevention of under 100. Will need to repeat lipid panel in one month  Plan: - restart 81 mg aspirin daily - continue atorvastatin 40 mg daily, lipid panel at follow up - continue management of diabetes  OSA (obstructive  sleep apnea) Assessment: Has been adherent to his cpap for the past two months.   Plan: - continue cpap use  Type 2 diabetes mellitus with hyperglycemia (HCC) Assessment: Prior regimen of metformin 1000 mg BID and ozempic. Both of these were stopped prior to his gastric sleeve procedure, he continued the metformin post operatively. Will continue to hold mounjaro for now. A1c well controlled at 6.9% 3 weeks ago. Will see how he does on single regimen and hopeful with weight loss from  gastric sleeve, will have continued improvement.   Plan: - continue metformin 1000 mg alone - follow up A1c  Hyperlipidemia Assessment: LDL not at goal for secondary prevention. Notes adherence to atorvastatin. If no improvement on repeat add zetia  Plan: - continue atorvastatin 40 mg - add zetia at follow up if LDL not at goal  S/P laparoscopic sleeve gastrectomy Assessment: Doing well s/p gastric sleeve. Has had minimal pain or gastritis. Has follow up with Dr. Redmond Pulling 04/03. Surgical sites covered by steri strips and are non-tender or erythematous.   Plan: - follow up with Dr. Redmond Pulling  Erectile dysfunction Assessment: He has noticed decreased erections over the last few weeks. He has decreased morning erections. Also notes inability to maintain an erection. Denies decrease in libido.Penile and testicular exam unremarkable. Likely ED secondary to vascular disease with history of diabetes and hyperlipidemia. Will start on sildenafil 50 mg prior to sexual activity. Does not take a nitroglycerin and counseled on taking sildenafil with nitrates.   Plan: - sildenafil 50 mg PRN - follow up effectiveness   Healthcare maintenance Assessment: Cologuard 02/2022 negative. Will repeat in 2 years  Plan: - repeat cologuard testing in 2 years   Patient discussed with Dr. Fanny Bien, D.O. [facility], PGY-3 Phone: [phone] Date 11/03/2022 Time 7:40 PM

## 2022-11-03 NOTE — Patient Instructions (Addendum)
Thank you, Mr.Demetrus A Huron for allowing [place] to provide your care today. Today we discussed .  Gastric sleeve You are doing great since your procedure. Please continue to not take your mounjaro. It is okay to restart your 81 mg aspirin. Avoid other pain medications labeled as NSAIDS like ibuprofen.     High blood pressure Please purchase a home blood pressure cuff and record a few times a week. Please continue your losartan-HCTZ (I have sent in a refill), amlodipine, and spironolactone  Diabetes Continue your metformin and stop taking your mounjaro.   Erectile dysfunction I will write you a prescription for sildenafil (viagra) please take 1 hour before sexual activity. Please let us know if you have any side effects  I have ordered the following labs for you:  Lab Orders  No laboratory test(s) ordered today      Referrals ordered today:   Referral Orders  No referral(s) requested today     I have ordered the following medication/changed the following medications:   Stop the following medications: Medications Discontinued During This Encounter  Medication Reason   losartan-hydrochlorothiazide (HYZAAR) 100-12.5 MG tablet Reorder     Start the following medications: Meds ordered this encounter  Medications   sildenafil (VIAGRA) 50 MG tablet    Sig: Take 1 tablet (50 mg total) by mouth as needed for erectile dysfunction. Take 1 hour before sexual activity    Dispense:  20 tablet    Refill:  1   losartan-hydrochlorothiazide (HYZAAR) 100-12.5 MG tablet    Sig: Take 1 tablet by mouth daily.    Dispense:  90 tablet    Refill:  2   metFORMIN (GLUCOPHAGE) 1000 MG tablet    Sig: Take 1 tablet (1,000 mg total) by mouth 2 (two) times daily with a meal.    Dispense:  90 tablet    Refill:  2     Follow up:  1 month follow up      Should you have any questions or concerns please call the internal medicine clinic at [phone].    Sanjuana Letters, D.O. [place]

## 2022-11-03 NOTE — Assessment & Plan Note (Signed)
Assessment: LDL not at goal for secondary prevention. Notes adherence to atorvastatin. If no improvement on repeat add zetia  Plan: - continue atorvastatin 40 mg - add zetia at follow up if LDL not at goal

## 2022-11-03 NOTE — Assessment & Plan Note (Signed)
Assessment: Doing well s/p gastric sleeve. Has had minimal pain or gastritis. Has follow up with Dr. Redmond Pulling 04/03. Surgical sites covered by steri strips and are non-tender or erythematous.   Plan: - follow up with Dr. Redmond Pulling

## 2022-11-03 NOTE — Assessment & Plan Note (Signed)
Assessment: He has noticed decreased erections over the last few weeks. He has decreased morning erections. Also notes inability to maintain an erection. Denies decrease in libido.Penile and testicular exam unremarkable. Likely ED secondary to vascular disease with history of diabetes and hyperlipidemia. Will start on sildenafil 50 mg prior to sexual activity. Does not take a nitroglycerin and counseled on taking sildenafil with nitrates.   Plan: - sildenafil 50 mg PRN - follow up effectiveness

## 2022-11-03 NOTE — Assessment & Plan Note (Addendum)
Assessment: Prior regimen of metformin 1000 mg BID and ozempic. Both of these were stopped prior to his gastric sleeve procedure, he continued the metformin post operatively. Will continue to hold mounjaro for now. A1c well controlled at 6.9% 3 weeks ago. Will see how he does on single regimen and hopeful with weight loss from gastric sleeve, will have continued improvement.   Plan: - continue metformin 1000 mg alone - follow up A1c

## 2022-11-03 NOTE — Assessment & Plan Note (Signed)
Assessment: Has been adherent to his cpap for the past two months.   Plan: - continue cpap use

## 2022-11-03 NOTE — Assessment & Plan Note (Addendum)
Assessment: Current regimen of losartan-hctz 100-12.5 mg, spironolactone 25 mg, and amlodipine 10 mg daily. He did not take his losartan-hctz this morning, refill sent in. Recommend purchasing a home blood pressure cuff and recording daily. If persistently elevated can increase HCTZ dosing. He has been adherent to his cpap.   Plan: - continue regimen as per above - follow up for BP check in 1 month, bring in log

## 2022-11-03 NOTE — Assessment & Plan Note (Addendum)
Assessment: Aspirin discontinued prior to gastric sleeve procedure on 10/18/22. Recommended resuming his aspirin. Last LDL not at goal for secondary prevention of under 100. Will need to repeat lipid panel in one month  Plan: - restart 81 mg aspirin daily - continue atorvastatin 40 mg daily, lipid panel at follow up - continue management of diabetes

## 2022-11-03 NOTE — Assessment & Plan Note (Signed)
Assessment: Cologuard 02/2022 negative. Will repeat in 2 years  Plan: - repeat cologuard testing in 2 years

## 2022-11-05 DIAGNOSIS — G4733 Obstructive sleep apnea (adult) (pediatric): Secondary | ICD-10-CM | POA: Diagnosis not present

## 2022-11-16 NOTE — Progress Notes (Signed)
Internal Medicine Clinic Attending  Case discussed with Dr. Katsadouros at the time of the visit.  We reviewed the resident's history and exam and pertinent patient test results.  I agree with the assessment, diagnosis, and plan of care documented in the resident's note.  

## 2022-12-06 ENCOUNTER — Ambulatory Visit (INDEPENDENT_AMBULATORY_CARE_PROVIDER_SITE_OTHER): Payer: BC Managed Care – PPO

## 2022-12-06 VITALS — BP 137/64 | HR 71 | Temp 98.2°F | Ht 71.0 in | Wt 324.1 lb

## 2022-12-06 DIAGNOSIS — E1165 Type 2 diabetes mellitus with hyperglycemia: Secondary | ICD-10-CM

## 2022-12-06 DIAGNOSIS — I10 Essential (primary) hypertension: Secondary | ICD-10-CM

## 2022-12-06 DIAGNOSIS — Z7984 Long term (current) use of oral hypoglycemic drugs: Secondary | ICD-10-CM

## 2022-12-06 DIAGNOSIS — E782 Mixed hyperlipidemia: Secondary | ICD-10-CM

## 2022-12-06 DIAGNOSIS — N529 Male erectile dysfunction, unspecified: Secondary | ICD-10-CM

## 2022-12-06 DIAGNOSIS — Z7985 Long-term (current) use of injectable non-insulin antidiabetic drugs: Secondary | ICD-10-CM

## 2022-12-06 DIAGNOSIS — G4733 Obstructive sleep apnea (adult) (pediatric): Secondary | ICD-10-CM | POA: Diagnosis not present

## 2022-12-06 MED ORDER — MOUNJARO 2.5 MG/0.5ML ~~LOC~~ SOAJ: 2.5000 mg | SUBCUTANEOUS | 0 refills | Status: DC

## 2022-12-06 NOTE — Progress Notes (Signed)
   CC: Blood pressure recheck  HPI:  Travis Powell is a 52 y.o. with past medical history as below who presents for blood pressure recheck.  Please see detailed assessment and plan for HPI.  Past Medical History:  Diagnosis Date   Abdominal lymphadenopathy 06/21/2012   Borderline abdominal/pelvic adenopathy seen incidentally on CT scan 2011.  No change follow up 10/13   DM2 (diabetes mellitus, type 2) (HCC) 06/21/2012   History of kidney stones    HTN (hypertension), benign 06/21/2012   Left lumbar radiculopathy 06/21/2012   Leukocytosis 06/21/2012   High normal @ 10,000  41 poly, 46% lymphs stable over time 6// to 10/13 no anemia or thrombocytopenia   Migraine    Neuropathy    Obesity    Sleep apnea    uses CPAP at night   Stroke [facility])    TIA in 04/2022   Review of Systems: Please see detailed assessment and plan for pertinent ROS.  Physical Exam:  Vitals:   12/06/22 1053  BP: 137/64  Pulse: 71  Temp: 98.2 F (36.8 C)  TempSrc: Oral  SpO2: 99%  Weight: (!) 324 lb 1.6 oz (147 kg)  Height:  (1.803 m)   Physical Exam Constitutional:      General: He is not in acute distress.    Appearance: He is obese.  HENT:     Head: Normocephalic and atraumatic.  Eyes:     Extraocular Movements: Extraocular movements intact.  Cardiovascular:     Rate and Rhythm: Normal rate and regular rhythm.     Heart sounds: No murmur heard. Pulmonary:     Effort: Pulmonary effort is normal.     Breath sounds: No wheezing, rhonchi or rales.  Abdominal:     Palpations: Abdomen is soft.  Musculoskeletal:     Comments: Trace pitting edema bilaterally  Skin:    General: Skin is warm and dry.  Neurological:     Mental Status: He is alert and oriented to person, place, and time.  Psychiatric:        Mood and Affect: Mood normal.        Behavior: Behavior normal.      Assessment & Plan:   See Encounters Tab for problem based charting.  HTN (hypertension), benign Patient  presents for follow-up of his hypertension.  He is currently taking amlodipine 10 mg daily, losartan-hydrochlorothiazide 100-12.5 mg daily, and spironolactone 25 mg daily.  He denies symptoms of hypertension including chest pain, shortness of breath, vision changes, headaches or symptoms of hypotension.  Blood pressure close to goal at 137/64 today.  His BMP was within normal limits at last follow-up. -Continue with current regimen  Type 2 diabetes mellitus with hyperglycemia [facility]) Patient presents with history of type 2 diabetes.  Discussed restarting Mounjaro with surgeon after gastric sleeve and there should not be any complications restarting this medication. -Restart Mounjaro 2.5 mg weekly.  Hyperlipidemia Patient presents with history of hyperlipidemia, LDL not at goal for secondary prevention.  Patient is on atorvastatin 40 mg.  Will repeat lipid panel today and add Zetia if necessary. -Lipid panel  Erectile dysfunction Patient presents for follow-up of his erectile dysfunction.  He is currently prescribed sildenafil 50 mg as needed.  He reports that his symptoms are much improved, attributing this to weight loss. -Sildenafil 50 mg as needed -Follow-up symptoms periodically  Patient discussed with Dr. Criselda Peaches

## 2022-12-06 NOTE — Patient Instructions (Signed)
Travis Powell, it was a pleasure seeing you today!  Today we discussed: Blood pressure continue on current regimen Cholesterol - will call with lab results Diabetes - restart monjouro   I have ordered the following labs today:   Lab Orders         Lipid Profile      Tests ordered today:  none  Referrals ordered today:   Referral Orders  No referral(s) requested today     I have ordered the following medication/changed the following medications:   Stop the following medications: There are no discontinued medications.   Start the following medications: Meds ordered this encounter  Medications   tirzepatide (MOUNJARO) 2.5 MG/0.5ML Pen    Sig: Inject 2.5 mg into the skin once a week for 2 doses.    Dispense:  1 mL    Refill:  0     Follow-up:  1 month    Please make sure to arrive 15 minutes prior to your next appointment. If you arrive late, you may be asked to reschedule.   We look forward to seeing you next time. Please call our clinic at [phone] if you have any questions or concerns. The best time to call is Monday-Friday from 9am-4pm, but there is someone available 24/7. If after hours or the weekend, call the main hospital number and ask for the Internal Medicine Resident On-Call. If you need medication refills, please notify your pharmacy one week in advance and they will send [place] a request.  Thank you for letting us take part in your care. Wishing you the best!  Thank you, Adron Bene, MD

## 2022-12-06 NOTE — Assessment & Plan Note (Signed)
Patient presents with history of type 2 diabetes.  Discussed restarting Mounjaro with surgeon after gastric sleeve and there should not be any complications restarting this medication. -Restart Mounjaro 2.5 mg weekly.

## 2022-12-06 NOTE — Assessment & Plan Note (Addendum)
Patient presents with history of hyperlipidemia, LDL not at goal for secondary prevention.  Patient is on atorvastatin 40 mg.  Will repeat lipid panel today and add Zetia if necessary. -Lipid panel

## 2022-12-06 NOTE — Assessment & Plan Note (Signed)
Patient presents for follow-up of his erectile dysfunction.  He is currently prescribed sildenafil 50 mg as needed.  He reports that his symptoms are much improved, attributing this to weight loss. -Sildenafil 50 mg as needed -Follow-up symptoms periodically

## 2022-12-06 NOTE — Assessment & Plan Note (Signed)
Patient presents for follow-up of his hypertension.  He is currently taking amlodipine 10 mg daily, losartan-hydrochlorothiazide 100-12.5 mg daily, and spironolactone 25 mg daily.  He denies symptoms of hypertension including chest pain, shortness of breath, vision changes, headaches or symptoms of hypotension.  Blood pressure close to goal at 137/64 today.  His BMP was within normal limits at last follow-up. -Continue with current regimen

## 2022-12-07 LAB — LIPID PANEL
Chol/HDL Ratio: 3.6 ratio (ref 0.0–5.0)
Cholesterol, Total: 131 mg/dL (ref 100–199)
HDL: 36 mg/dL — ABNORMAL LOW (ref >39)
LDL Chol Calc (NIH): 82 mg/dL (ref 0–99)
Triglycerides: 61 mg/dL (ref 0–149)
VLDL Cholesterol Cal: 13 mg/dL (ref 5–40)

## 2022-12-21 NOTE — Progress Notes (Signed)
Internal Medicine Clinic Attending  Case discussed with Dr. White  at the time of the visit.  We reviewed the resident's history and exam and pertinent patient test results.  I agree with the assessment, diagnosis, and plan of care documented in the resident's note.  

## 2022-12-27 ENCOUNTER — Ambulatory Visit: Payer: BC Managed Care – PPO | Admitting: Neurology

## 2022-12-27 ENCOUNTER — Encounter: Payer: Self-pay | Admitting: Neurology

## 2023-01-05 DIAGNOSIS — G4733 Obstructive sleep apnea (adult) (pediatric): Secondary | ICD-10-CM | POA: Diagnosis not present

## 2023-01-18 ENCOUNTER — Encounter: Payer: Self-pay | Admitting: Student

## 2023-01-31 ENCOUNTER — Ambulatory Visit: Payer: BC Managed Care – PPO | Admitting: Dietician

## 2023-02-05 DIAGNOSIS — G4733 Obstructive sleep apnea (adult) (pediatric): Secondary | ICD-10-CM | POA: Diagnosis not present

## 2023-02-11 ENCOUNTER — Other Ambulatory Visit: Payer: Self-pay | Admitting: Student

## 2023-02-11 ENCOUNTER — Telehealth: Payer: Self-pay

## 2023-02-11 DIAGNOSIS — E1169 Type 2 diabetes mellitus with other specified complication: Secondary | ICD-10-CM

## 2023-02-11 MED ORDER — MOUNJARO 2.5 MG/0.5ML ~~LOC~~ SOAJ: 2.5000 mg | SUBCUTANEOUS | 2 refills | Status: DC

## 2023-02-11 NOTE — Telephone Encounter (Signed)
Medication refill for Mounjaro, medication has expired off of med list.  [id number] - [place], [place] - [address] [place] [address]

## 2023-02-23 ENCOUNTER — Other Ambulatory Visit: Payer: Self-pay

## 2023-02-23 ENCOUNTER — Encounter: Payer: Self-pay | Admitting: Internal Medicine

## 2023-02-23 ENCOUNTER — Ambulatory Visit (INDEPENDENT_AMBULATORY_CARE_PROVIDER_SITE_OTHER): Payer: BC Managed Care – PPO | Admitting: Internal Medicine

## 2023-02-23 VITALS — BP 135/87 | HR 84 | Temp 98.3°F | Ht 71.0 in | Wt 292.4 lb

## 2023-02-23 DIAGNOSIS — E1169 Type 2 diabetes mellitus with other specified complication: Secondary | ICD-10-CM | POA: Diagnosis not present

## 2023-02-23 DIAGNOSIS — I10 Essential (primary) hypertension: Secondary | ICD-10-CM | POA: Diagnosis not present

## 2023-02-23 DIAGNOSIS — E785 Hyperlipidemia, unspecified: Secondary | ICD-10-CM | POA: Diagnosis not present

## 2023-02-23 DIAGNOSIS — E782 Mixed hyperlipidemia: Secondary | ICD-10-CM

## 2023-02-23 DIAGNOSIS — Z9884 Bariatric surgery status: Secondary | ICD-10-CM

## 2023-02-23 DIAGNOSIS — E669 Obesity, unspecified: Secondary | ICD-10-CM

## 2023-02-23 DIAGNOSIS — I6381 Other cerebral infarction due to occlusion or stenosis of small artery: Secondary | ICD-10-CM

## 2023-02-23 LAB — POCT GLYCOSYLATED HEMOGLOBIN (HGB A1C): Hemoglobin A1C: 5.3 % (ref 4.0–5.6)

## 2023-02-23 LAB — GLUCOSE, CAPILLARY: Glucose-Capillary: 78 mg/dL (ref 70–99)

## 2023-02-23 MED ORDER — ATORVASTATIN CALCIUM 80 MG PO TABS: 80.0000 mg | ORAL_TABLET | Freq: Every day | ORAL | 3 refills | Status: DC

## 2023-02-23 MED ORDER — SPIRONOLACTONE 25 MG PO TABS: 25.0000 mg | ORAL_TABLET | Freq: Every day | ORAL | 3 refills | Status: DC

## 2023-02-23 NOTE — Patient Instructions (Addendum)
Thank you, [name] for allowing [place] to provide your care today.   Congratulations on the weight loss!  History of stroke- the best way to prevent future strokes is by controlling blood pressure, cholesterol and diabetes. You cholesterol goal is for LDL to be <70. It was in 80s when checked in April. I am sending in lipitor 80 mg for you to pick up. I am ok with you taking 2 tablets of atorvastatin 40 mg to use up current medication.  Your blood pressure was ok with the position changes. Continue taking meds as you have been. I sent in refill for spironolactone.   Your A1c was improved. Continue with Mounjaro. I removed metformin from your medication list as you do not need to continue taking it.  I also would encourage you to decrease amount you are smoking as this can increase risk of stroke. If you are interested in cessation and would like help there are medications that would help decrease cravings.  I also included information about shingrix. [facility].  I have ordered the following labs for you:  Lab Orders         Glucose, capillary         POC Hbg A1C      I have ordered the following medication/changed the following medications:   Stop the following medications: Medications Discontinued During This Encounter  Medication Reason   metFORMIN (GLUCOPHAGE) 1000 MG tablet    tirzepatide (MOUNJARO) 2.5 MG/0.5ML Pen    pantoprazole (PROTONIX) 40 MG tablet    atorvastatin (LIPITOR) 40 MG tablet    spironolactone (ALDACTONE) 25 MG tablet Reorder     Start the following medications: Meds ordered this encounter  Medications   spironolactone (ALDACTONE) 25 MG tablet    Sig: Take 1 tablet (25 mg total) by mouth daily.    Dispense:  90 tablet    Refill:  3   atorvastatin (LIPITOR) 80 MG tablet    Sig: Take 1 tablet (80 mg total) by mouth daily.    Dispense:  90 tablet    Refill:  3     Follow up:  3 months  We look forward to seeing you next  time. Please call our clinic at [phone] if you have any questions or concerns. The best time to call is Monday-Friday from 9am-4pm, but there is someone available 24/7. If after hours or the weekend, call the main hospital number and ask for the Internal Medicine Resident On-Call. If you need medication refills, please notify your pharmacy one week in advance and they will send [place] a request.   Thank you for trusting me with your care. Wishing you the best!   Rudene Christians, DO [facility]

## 2023-02-23 NOTE — Progress Notes (Unsigned)
Subjective:  CC: diabetes   HPI:  [name] is a 52 y.o. male with a past medical history stated below and presents today for diabetes and hypertension.  He underwent gastric sleeve surgery in April and feels that he is doing well after that. Please see problem based assessment and plan for additional details.  Past Medical History:  Diagnosis Date   CVA (cerebral vascular accident) (HCC)    DM2 (diabetes mellitus, type 2) (HCC) 06/21/2012   History of kidney stones    HTN (hypertension), benign 06/21/2012   Hyperlipidemia    Left lumbar radiculopathy 06/21/2012   Migraine    Neuropathy    Obesity    Sleep apnea    uses CPAP at night    Current Outpatient Medications on File Prior to Visit  Medication Sig Dispense Refill   amLODipine (NORVASC) 10 MG tablet TAKE 1 TABLET (10 MG TOTAL) BY MOUTH EVERY MORNING. 90 tablet 1   losartan-hydrochlorothiazide (HYZAAR) 100-12.5 MG tablet Take 1 tablet by mouth daily. 90 tablet 2   sildenafil (VIAGRA) 50 MG tablet Take 1 tablet (50 mg total) by mouth as needed for erectile dysfunction. Take 1 hour before sexual activity 20 tablet 1   tamsulosin (FLOMAX) 0.4 MG CAPS capsule Take 0.4 mg by mouth daily.     tirzepatide [facility]) 2.5 MG/0.5ML Pen Inject 2.5 mg into the skin once a week. 2 mL 2   No current facility-administered medications on file prior to visit.    Family History  Problem Relation Age of Onset   Hypertension Mother    Diabetes Mother    Hypertension Father    Deep vein thrombosis Father    Diabetes Brother    Hypertension Brother    CAD Other    Stroke Other     Social History   Socioeconomic History   Marital status: Married    Spouse name: Not on file   Number of children: Not on file   Years of education: Not on file   Highest education level: Not on file  Occupational History   Not on file  Tobacco Use   Smoking status: Some Days    Types: Cigars   Smokeless tobacco: Never  Vaping Use    Vaping status: Never Used  Substance and Sexual Activity   Alcohol use: Not Currently    Comment: occ    been over a month   Drug use: No   Sexual activity: Yes  Other Topics Concern   Not on file  Social History Narrative   Caffeine soda every now and then.   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    Social Determinants of Health   Financial Resource Strain: Low Risk  (06/30/2022)   Overall Financial Resource Strain (CARDIA)    Difficulty of Paying Living Expenses: Not hard at all  Food Insecurity: No Food Insecurity (10/19/2022)   Hunger Vital Sign    Worried About Running Out of Food in the Last Year: Never true    Ran Out of Food in the Last Year: Never true  Transportation Needs: No Transportation Needs (10/22/2022)   PRAPARE - Administrator, Civil Service (Medical): No    Lack of Transportation (Non-Medical): No  Physical Activity: Sufficiently Active (06/30/2022)   Exercise Vital Sign    Days of Exercise per Week: 5 days    Minutes of Exercise per Session:  60 min  Stress: No Stress Concern Present (06/30/2022)   [employer] of Occupational Health - Occupational Stress Questionnaire    Feeling of Stress : Not at all  Social Connections: Moderately Isolated (06/30/2022)   Social Connection and Isolation Panel [NHANES]    Frequency of Communication with Friends and Family: Three times a week    Frequency of Social Gatherings with Friends and Family: Once a week    Attends Religious Services: Never    Database administrator or Organizations: No    Attends Engineer, structural: Not on file    Marital Status: Married  Catering manager Violence: Not At Risk (10/19/2022)   Humiliation, Afraid, Rape, and Kick questionnaire    Fear of Current or Ex-Partner: No    Emotionally Abused: No    Physically Abused: No    Sexually Abused: No    Review of Systems: ROS negative except for what is  noted on the assessment and plan.  Objective:   Vitals:   02/23/23 0918  BP: 135/87  Pulse: 84  Temp: 98.3 F (36.8 C)  TempSrc: Oral  SpO2: 99%  Weight: 292 lb 6.4 oz (132.6 kg)  Height: 5\' 11"  (1.803 m)    Physical Exam: Constitutional: well-appearing  Cardiovascular: regular rate and rhythm, no m/r/g Pulmonary/Chest: normal work of breathing on room air, lungs clear to auscultation bilaterally Abdominal: soft, non-tender, non-distended MSK: normal bulk and tone Skin: warm and dry  Assessment & Plan:  HTN (hypertension), benign Patient reports adherence to Amlodipine 10 mg, Losartan-hydrochlorothiazide 100-12.5 mg, Spironolactone 25 mg. Has OSA treated with CPAP. Renin/aldo 2023 was wnl. He does not check bp at home. He does feel dizzy with standing up fast at work but is careful. He drinks about 80 oz daily of water while at work. This happens 2-3 times weekly.  Orthostatics negative. P: Continue current medications Due for repeat BMP 03/25  Type 2 diabetes mellitus with obesity (HCC) Lab Results  Component Value Date   HGBA1C 6.9 (H) 10/12/2022   Lab Results  Component Value Date   HGBA1C 5.3 02/23/2023   He has been intermittently taking metformin as he was not sure if he was supposed to be on this. He has been taking mounjaro. Denies side effects. He feels that he is doing well since gastric sleeve surgery and has lost 32 lbs. -mounjaro 2.5 mg weekly  S/P laparoscopic sleeve gastrectomy Gastric sleeve surgery 4/24: He's lost about 32 lbs since April He takes bariatric vitamin and calcium supplement. [facility].  Hyperlipidemia Lab Results  Component Value Date   CHOL 131 12/06/2022   HDL 36 (L) 12/06/2022   LDLCALC 82 12/06/2022   TRIG 61 12/06/2022   CHOLHDL 3.6 12/06/2022   He has not been taking zetia. His LDL goal is for secondary prevention with history of CVA.  -increase atorvastion to 80 mg every day -Repeat lipid panel after 4-6  weeks   Patient discussed with Dr. Cleda Daub   Laiden Milles, D.O. [facility] Internal [facility]  PGY-3 Pager: [phone]  Phone: [phone] Date 02/24/2023  Time 8:12 AM

## 2023-02-24 ENCOUNTER — Encounter: Payer: Self-pay | Admitting: Internal Medicine

## 2023-02-24 NOTE — Assessment & Plan Note (Signed)
Lab Results  Component Value Date   HGBA1C 6.9 (H) 10/12/2022   Lab Results  Component Value Date   HGBA1C 5.3 02/23/2023   He has been intermittently taking metformin as he was not sure if he was supposed to be on this. He has been taking mounjaro. Denies side effects. He feels that he is doing well since gastric sleeve surgery and has lost 32 lbs. -mounjaro 2.5 mg weekly

## 2023-02-24 NOTE — Assessment & Plan Note (Signed)
Lab Results  Component Value Date   CHOL 131 12/06/2022   HDL 36 (L) 12/06/2022   LDLCALC 82 12/06/2022   TRIG 61 12/06/2022   CHOLHDL 3.6 12/06/2022   He has not been taking zetia. His LDL goal is for secondary prevention with history of CVA.  -increase atorvastion to 80 mg every day -Repeat lipid panel after 4-6 weeks

## 2023-02-24 NOTE — Assessment & Plan Note (Signed)
Patient reports adherence to Amlodipine 10 mg, Losartan-hydrochlorothiazide 100-12.5 mg, Spironolactone 25 mg. Has OSA treated with CPAP. Renin/aldo 2023 was wnl. He does not check bp at home. He does feel dizzy with standing up fast at work but is careful. He drinks about 80 oz daily of water while at work. This happens 2-3 times weekly.  Orthostatics negative. P: Continue current medications Due for repeat BMP 03/25

## 2023-02-24 NOTE — Assessment & Plan Note (Signed)
Gastric sleeve surgery 4/24: He's lost about 32 lbs since April He takes bariatric vitamin and calcium supplement. [facility] [place] [facility].

## 2023-03-01 NOTE — Progress Notes (Signed)
 Internal Medicine Clinic Attending  Case discussed with the resident at the time of the visit.  We reviewed the resident's history and exam and pertinent patient test results.  I agree with the assessment, diagnosis, and plan of care documented in the resident's note.  

## 2023-03-07 DIAGNOSIS — G4733 Obstructive sleep apnea (adult) (pediatric): Secondary | ICD-10-CM | POA: Diagnosis not present

## 2023-04-07 DIAGNOSIS — G4733 Obstructive sleep apnea (adult) (pediatric): Secondary | ICD-10-CM | POA: Diagnosis not present

## 2023-05-08 DIAGNOSIS — G4733 Obstructive sleep apnea (adult) (pediatric): Secondary | ICD-10-CM | POA: Diagnosis not present

## 2023-06-29 ENCOUNTER — Telehealth: Payer: Self-pay | Admitting: Neurology

## 2023-06-29 NOTE — Telephone Encounter (Signed)
Received a request for surgical clearance to be completed. Form completed and signed by Dr Pearlean Brownie and faxed to the number on paper. Received confirmation.

## 2023-07-15 ENCOUNTER — Other Ambulatory Visit: Payer: Self-pay

## 2023-07-15 ENCOUNTER — Encounter: Payer: Self-pay | Admitting: *Deleted

## 2023-07-15 ENCOUNTER — Ambulatory Visit
Admission: RE | Admit: 2023-07-15 | Discharge: 2023-07-15 | Disposition: A | Discharge status: Home / Self Care | Payer: BC Managed Care – PPO | Source: Ambulatory Visit

## 2023-07-15 DIAGNOSIS — M25561 Pain in right knee: Secondary | ICD-10-CM | POA: Diagnosis not present

## 2023-07-15 DIAGNOSIS — M109 Gout, unspecified: Secondary | ICD-10-CM | POA: Diagnosis not present

## 2023-07-15 MED ORDER — PREDNISONE 20 MG PO TABS: 40.0000 mg | ORAL_TABLET | Freq: Every day | ORAL | 0 refills | Status: AC

## 2023-07-15 NOTE — ED Triage Notes (Signed)
Pt reports right knee swollen and painful x 2 weeks. Voices concern for gout. States he has had gout in right foot before but never the knee. Denies known injury

## 2023-07-15 NOTE — ED Provider Notes (Signed)
[name]    CSN: [id number] Arrival date & time: 07/15/23  1755      History   Chief Complaint Chief Complaint  Patient presents with   Knee Pain    HPI Travis Powell is a 52 y.o. male.   Patient presents today with a 2-1/2-week history of right knee pain.  He has a history of gout with similar presentation in his concern he has gout in his knee.  He reports that pain is currently rated 4/5 but when it initially began several weeks ago with 8/9 on a 0-10 pain scale, localized to right knee without radiation, described as aching, no aggravating or alleviating factors identified.  He has tried Tylenol without improvement.  He denies any fever, nausea, vomiting.  Denies any changes to his medications or dietary habits.  He did have sleeve gastrectomy within the year and has lost approximately 100 pounds.  He has never seen orthopedic provider.  Denies any recent fall or trauma.  He does have a remote history of diabetes but his last A1c was 5.3% July 2024.  He is having difficulty with his daily duties including walking as result of the pain.    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 06/17/2022   CVA (cerebral vascular accident) (HCC) 04/15/2022   Healthcare maintenance 03/11/2022   Body mass index (BMI) 45.0-49.9, adult (HCC) 10/09/2021   Gout 11/24/2020   Morbid obesity (HCC) 02/06/2019   OSA (obstructive sleep apnea) 06/18/2014   Hyperlipidemia 06/18/2014   Abdominal lymphadenopathy  06/21/2012   HTN (hypertension), benign 06/21/2012   Type 2 diabetes mellitus with obesity (HCC) 06/21/2012    Past Surgical History:  Procedure Laterality Date   CARPAL TUNNEL RELEASE Left    FRACTURE SURGERY     right hand   LAPAROSCOPIC GASTRIC SLEEVE RESECTION N/A 10/18/2022   Procedure: LAPAROSCOPIC SLEEVE GASTRECTOMY;  Surgeon: Gaynelle Adu, MD;  Location: [facility];  Service: General;  Laterality: N/A;   UPPER GI ENDOSCOPY N/A 10/18/2022   Procedure: UPPER GI ENDOSCOPY;  Surgeon: Gaynelle Adu, MD;  Location: [facility];  Service: General;  Laterality: N/A;       Home Medications    Prior to Admission medications   Medication Sig Start Date End Date Taking? Authorizing Provider  amLODipine (NORVASC) 10 MG tablet TAKE 1 TABLET (10 MG TOTAL) BY MOUTH EVERY MORNING. 09/07/22  Yes Katsadouros, Vasilios, MD  aspirin EC 81 MG tablet Take 81 mg by mouth daily. 04/17/22  Yes [provider]  atorvastatin (LIPITOR) 80 MG tablet Take 1 tablet (80 mg total) by mouth daily. 02/23/23  Yes Masters, Katie, DO  losartan-hydrochlorothiazide (HYZAAR) 100-12.5 MG tablet Take 1 tablet by mouth daily. 11/03/22  Yes Katsadouros, Vasilios, MD  predniSONE (DELTASONE) 20 MG tablet Take 2 tablets (40 mg total) by mouth daily for 5 days. 07/15/23 07/20/23 Yes Shawna Kiener, Noberto Retort, PA-C  sildenafil (VIAGRA) 50 MG tablet Take 1 tablet (50 mg  total) by mouth as needed for erectile dysfunction. Take 1 hour before sexual activity 11/03/22 11/03/23 Yes Katsadouros, Vasilios, MD  spironolactone (ALDACTONE) 25 MG tablet Take 1 tablet (25 mg total) by mouth daily. 02/23/23 02/23/24 Yes Masters, Katie, DO  tamsulosin (FLOMAX) 0.4 MG CAPS capsule Take 0.4 mg by mouth daily. 03/06/21  Yes [provider]  tirzepatide [facility]) 2.5 MG/0.5ML Pen Inject 2.5 mg into the skin once a week. 02/11/23  Yes Morene Crocker, MD    Family History Family History  Problem Relation Age of Onset   Hypertension Mother    Diabetes  Mother    Hypertension Father    Deep vein thrombosis Father    Diabetes Brother    Hypertension Brother    CAD Other    Stroke Other     Social History Social History   Tobacco Use   Smoking status: Former    Types: Cigars   Smokeless tobacco: Never  Vaping Use   Vaping status: Never Used  Substance Use Topics   Alcohol use: Yes    Comment: occ    been over a month   Drug use: No     Allergies   Patient has no known allergies.   Review of Systems Review of Systems  Constitutional:  Positive for activity change. Negative for appetite change, fatigue and fever.  Gastrointestinal:  Negative for abdominal pain, diarrhea and nausea.  Musculoskeletal:  Positive for arthralgias, gait problem and joint swelling. Negative for myalgias.  Skin:  Negative for color change and wound.  Neurological:  Negative for weakness and numbness.     Physical Exam Triage Vital Signs ED Triage Vitals  Encounter Vitals Group     BP 07/15/23 1827 (!) 167/98     Systolic BP Percentile --      Diastolic BP Percentile --      Pulse Rate 07/15/23 1827 70     Resp 07/15/23 1827 18     Temp 07/15/23 1827 98 F (36.7 C)     Temp Source 07/15/23 1827 Oral     SpO2 07/15/23 1827 99 %     Weight --      Height --      Head Circumference --      Peak Flow --      Pain Score 07/15/23 1822 4     Pain Loc --      Pain Education --      Exclude from Growth Chart --    No data found.  Updated Vital Signs BP (!) 167/98 (BP Location: Left Arm)   Pulse 70   Temp 98 F (36.7 C) (Oral)   Resp 18   SpO2 99%   Visual Acuity Right Eye Distance:   Left Eye Distance:   Bilateral Distance:    Right Eye Near:   Left Eye Near:    Bilateral Near:     Physical Exam Vitals reviewed.  Constitutional:      General: He is awake.     Appearance: Normal appearance. He is well-developed. He is not ill-appearing.     Comments: Very pleasant male appears stated age in no acute distress sitting  comfortably in exam room  HENT:     Head: Normocephalic and atraumatic.     Mouth/Throat:     Pharynx: No oropharyngeal exudate, posterior oropharyngeal erythema or uvula swelling.  Cardiovascular:     Rate and Rhythm: Normal rate and regular rhythm.     Pulses:  Posterior tibial pulses are 2+ on the right side.     Heart sounds: Normal heart sounds, S1 normal and S2 normal. No murmur heard. Pulmonary:     Effort: Pulmonary effort is normal.     Breath sounds: Normal breath sounds. No stridor. No wheezing, rhonchi or rales.     Comments: Clear to auscultation bilaterally Musculoskeletal:     Right knee: Swelling and effusion present. Decreased range of motion. Tenderness present. No LCL laxity, MCL laxity, ACL laxity or PCL laxity.     Comments: Right knee: Moderate swelling with effusion.  Decreased range of motion with flexion.  Strength 5/5 bilateral lower extremities.  Tender to palpation of inferior joint line without deformity.  No ligamentous laxity on exam.  Neurological:     Mental Status: He is alert.  Psychiatric:        Behavior: Behavior is cooperative.      UC Treatments / Results  Labs (all labs ordered are listed, but only abnormal results are displayed) Labs Reviewed - No data to display  EKG   Radiology No results found.  Procedures Procedures (including critical care time)  Medications Ordered in UC Medications - No data to display  Initial Impression / Assessment and Plan / UC Course  I have reviewed the triage vital signs and the nursing notes.  Pertinent labs & imaging results that were available during my care of the patient were reviewed by me and considered in my medical decision making (see chart for details).     Patient is well-appearing, afebrile, nontoxic, nontachycardic.  Plain films were deferred as patient had no focal tenderness and denied any recent trauma.  Low suspicion for septic arthritis as symptoms have been present for  several weeks and he denies any fever or systemic symptoms.  Concern for gout flare given his clinical presentation.  Will start prednisone burst of 40 mg for 5 days given he is unable to take NSAIDs due to his history of sleeve gastrectomy.  He can use Tylenol for breakthrough pain.  Recommended that he keep it elevated and avoid strenuous activity.  Discussed that if symptoms are improving quickly he is to follow-up with orthopedics and was given contact information for local provider with instruction to call to schedule an appointment.  Strict return precautions given.  All questions answered patient satisfaction.  Final Clinical Impressions(s) / UC Diagnoses   Final diagnoses:  Acute gout of right knee, unspecified cause  Acute pain of right knee     Discharge Instructions      I am concerned that you have gout contributing to your symptoms.  Take prednisone 40 mg for 5 days.  Do not take NSAIDs with this medication including aspirin, ibuprofen/Advil, naproxen/Aleve.  If your symptoms do not improving quickly please follow-up with orthopedics.  Call them to schedule an appointment.  If anything worsens you have increasing pain, swelling, fever, nausea, vomiting you should be seen immediately.     ED Prescriptions     Medication Sig Dispense Auth. Provider   predniSONE (DELTASONE) 20 MG tablet Take 2 tablets (40 mg total) by mouth daily for 5 days. 10 tablet Cindia Hustead, Noberto Retort, PA-C      PDMP not reviewed this encounter.   Jeani Hawking, PA-C 07/15/23 1900

## 2023-07-15 NOTE — Discharge Instructions (Signed)
I am concerned that you have gout contributing to your symptoms.  Take prednisone 40 mg for 5 days.  Do not take NSAIDs with this medication including aspirin, ibuprofen/Advil, naproxen/Aleve.  If your symptoms do not improving quickly please follow-up with orthopedics.  Call them to schedule an appointment.  If anything worsens you have increasing pain, swelling, fever, nausea, vomiting you should be seen immediately.

## 2023-08-04 ENCOUNTER — Ambulatory Visit (INDEPENDENT_AMBULATORY_CARE_PROVIDER_SITE_OTHER): Payer: BC Managed Care – PPO | Admitting: Student

## 2023-08-04 VITALS — BP 143/70 | HR 74 | Temp 97.8°F | Ht 71.0 in | Wt 266.1 lb

## 2023-08-04 DIAGNOSIS — E669 Obesity, unspecified: Secondary | ICD-10-CM | POA: Diagnosis not present

## 2023-08-04 DIAGNOSIS — M109 Gout, unspecified: Secondary | ICD-10-CM

## 2023-08-04 DIAGNOSIS — Z6837 Body mass index (BMI) 37.0-37.9, adult: Secondary | ICD-10-CM

## 2023-08-04 DIAGNOSIS — M1A9XX0 Chronic gout, unspecified, without tophus (tophi): Secondary | ICD-10-CM | POA: Diagnosis not present

## 2023-08-04 DIAGNOSIS — I10 Essential (primary) hypertension: Secondary | ICD-10-CM | POA: Diagnosis not present

## 2023-08-04 DIAGNOSIS — E1169 Type 2 diabetes mellitus with other specified complication: Secondary | ICD-10-CM

## 2023-08-04 DIAGNOSIS — Z7985 Long-term (current) use of injectable non-insulin antidiabetic drugs: Secondary | ICD-10-CM

## 2023-08-04 MED ORDER — AMLODIPINE BESYLATE 10 MG PO TABS: 10.0000 mg | ORAL_TABLET | ORAL | 1 refills | Status: DC

## 2023-08-04 MED ORDER — COLCHICINE 0.6 MG PO TABS: 0.6000 mg | ORAL_TABLET | Freq: Every day | ORAL | 2 refills | Status: AC

## 2023-08-04 MED ORDER — ALLOPURINOL 100 MG PO TABS: 100.0000 mg | ORAL_TABLET | Freq: Every day | ORAL | 2 refills | Status: DC

## 2023-08-04 MED ORDER — OLMESARTAN MEDOXOMIL 40 MG PO TABS: 40.0000 mg | ORAL_TABLET | Freq: Every day | ORAL | 2 refills | Status: DC

## 2023-08-04 NOTE — Progress Notes (Signed)
CC: Hospital Follow Up   HPI:  Travis Powell is a 52 y.o. male with pertinent PMH of HTN, T2DM, prior CVA, OSA, class III obesity, and erectile dysfunction who presents for an ED follow-up from 07/15/2023 with right knee pain concerning for gout. Please see assessment and plan below for further details.  Past Medical History:  Diagnosis Date   CVA (cerebral vascular accident) (HCC)    DM2 (diabetes mellitus, type 2) (HCC) 06/21/2012   History of kidney stones    HTN (hypertension), benign 06/21/2012   Hyperlipidemia    Left lumbar radiculopathy 06/21/2012   Migraine    Neuropathy    Obesity    Sleep apnea    uses CPAP at night    Current Outpatient Medications  Medication Instructions   allopurinol (ZYLOPRIM) 100 mg, Oral, Daily   amLODipine (NORVASC) 10 mg, Oral, BH-each morning   aspirin EC 81 mg, Oral, Daily   atorvastatin (LIPITOR) 80 mg, Oral, Daily   colchicine 0.6 mg, Oral, Daily   Mounjaro 2.5 mg, Subcutaneous, Weekly   olmesartan (BENICAR) 40 mg, Oral, Daily   sildenafil (VIAGRA) 50 mg, Oral, As needed, Take 1 hour before sexual activity   spironolactone (ALDACTONE) 25 mg, Oral, Daily   tamsulosin (FLOMAX) 0.4 mg, Oral, Daily     Review of Systems:   Pertinent items noted in HPI and/or A&P.  Physical Exam:  Vitals:   08/04/23 0915 08/04/23 0935  BP: (!) 146/71 (!) 143/70  Pulse: 72 74  Temp: 97.8 F (36.6 C)   TempSrc: Oral   SpO2: 100%   Weight: 266 lb 1.6 oz (120.7 kg)   Height: 5\' 11"  (1.803 m)     Constitutional: Well-appearing middle-age male. In no acute distress. HEENT: Normocephalic, atraumatic, Sclera non-icteric, PERRL, EOM intact Cardio:Regular rate and rhythm. 2+ bilateral radial and dorsalis pedis  pulses. Pulm:Clear to auscultation bilaterally. Normal work of breathing on room air. Abdomen: Soft, non-tender, non-distended, positive bowel sounds. ZOX:[id number] for extremity edema. Skin:Warm and dry. Neuro:Alert and oriented x3. No  focal deficit noted. Psych:Pleasant mood and affect.   Assessment & Plan:   HTN (hypertension), benign Blood pressure today elevated 143/70.  He is currently on amlodipine 10 mg daily, losartan/hydrochlorothiazide 100/12.5 mg daily, and spironolactone 25 mg daily.  Due to his multiple gout areas we will stop the hydrochlorothiazide.  I also talked him about increasing the dose of his medication but he would like to check his ambulatory blood pressures instead.  I think this is reasonable and he will pick up an arm cuff and keep a blood pressure log.  I will call him in about 3 to 4 weeks to review his blood pressures. - Continue amlodipine 10 mg daily, spironolactone 25 mg daily - Stop losartan/hydrochlorothiazide and replace with olmesartan 40 mg daily  Type 2 diabetes mellitus with obesity (HCC) Last A1c was 5.3 on 02/23/2023.  He is overdue for a urine microalbumin creatinine ratio.  Continues on Mounjaro 2.5 mg weekly with benefit. - Continue Mounjaro 2.5 mg weekly - Urine microalbumin today was normal at less than 5 - A1c at next visit  Gout Patient has had about 4 gout attacks in the past year.  He returns today for an ED follow-up on 07/15/2023 for right knee pain most likely due to gout.  He was given 5 days of prednisone with resolution of the pain.  Due to his multiple flares of gout this year we will start him on uric acid lowering therapy  as well as stopping his hydrochlorothiazide. - Allopurinol 100 mg daily and colchicine 0.6 mg daily - Uric acid today (5.9)    Patient discussed with Dr. Alfonzo Beers, DO [facility] Internal Medicine Resident PGY-2 Clinic Phone: [phone] Pager: [phone]

## 2023-08-04 NOTE — Patient Instructions (Addendum)
  Thank you, Travis Powell, for allowing [place] to provide your care today. Today we discussed . . .  > Gout       -We are going to start you on preventative therapy for your gout with allopurinol 100 mg daily.  With this medication there is a risk of having a gout flare when you first started so we will have you take colchicine 0.6 mg daily for at least the first 3 months.  We will check a uric acid level today and again at your follow-up in 2-3 months to make sure that the medication is working. > Hypertension       -Please pick up a blood pressure cuff at the pharmacy.  Please make sure to get an arm cuff and use the provided blood pressure log to document your blood pressures once or twice daily.  If your blood pressure is staying above 130 on the top number and above 80 on the bottom number please call us in 2-3 weeks (early January) and we will talk about increasing one of your blood pressure medications.  Also one of your blood pressure medications can increase your risk of gout.  This medication is the hydrochlorothiazide so I am going to stop this medication and send you in a new prescription for a medication just like losartan called olmesartan since it is covered on your insurance.  Please see the attached medication list at the back and make sure it matches up with the medications you are taking at home.  I have ordered the following labs for you:  Lab Orders         Microalbumin / Creatinine Urine Ratio         Uric acid       I have ordered the following medication/changed the following medications:   Stop the following medications: Medications Discontinued During This Encounter  Medication Reason   amLODipine (NORVASC) 10 MG tablet Reorder   losartan-hydrochlorothiazide (HYZAAR) 100-12.5 MG tablet      Start the following medications: Meds ordered this encounter  Medications   allopurinol (ZYLOPRIM) 100 MG tablet    Sig: Take 1 tablet (100 mg total) by mouth daily.     Dispense:  30 tablet    Refill:  2   colchicine 0.6 MG tablet    Sig: Take 1 tablet (0.6 mg total) by mouth daily.    Dispense:  30 tablet    Refill:  2   amLODipine (NORVASC) 10 MG tablet    Sig: Take 1 tablet (10 mg total) by mouth every morning.    Dispense:  90 tablet    Refill:  1   olmesartan (BENICAR) 40 MG tablet    Sig: Take 1 tablet (40 mg total) by mouth daily.    Dispense:  30 tablet    Refill:  2      Follow up: 2-3 months    Remember:     Should you have any questions or concerns please call the internal medicine clinic at [phone].     Rocky Morel, DO [facility]

## 2023-08-05 LAB — MICROALBUMIN / CREATININE URINE RATIO
Creatinine, Urine: 59 mg/dL
Microalb/Creat Ratio: <5 mg/g{creat} (ref 0–29)
Microalbumin, Urine: <3 ug/mL

## 2023-08-05 LAB — URIC ACID: Uric Acid: 5.9 mg/dL (ref 3.8–8.4)

## 2023-08-08 NOTE — Assessment & Plan Note (Signed)
Blood pressure today elevated 143/70.  He is currently on amlodipine 10 mg daily, losartan/hydrochlorothiazide 100/12.5 mg daily, and spironolactone 25 mg daily.  Due to his multiple gout areas we will stop the hydrochlorothiazide.  I also talked him about increasing the dose of his medication but he would like to check his ambulatory blood pressures instead.  I think this is reasonable and he will pick up an arm cuff and keep a blood pressure log.  I will call him in about 3 to 4 weeks to review his blood pressures. - Continue amlodipine 10 mg daily, spironolactone 25 mg daily - Stop losartan/hydrochlorothiazide and replace with olmesartan 40 mg daily

## 2023-08-08 NOTE — Assessment & Plan Note (Addendum)
Patient has had about 4 gout attacks in the past year.  He returns today for an ED follow-up on 07/15/2023 for right knee pain most likely due to gout.  He was given 5 days of prednisone with resolution of the pain.  Due to his multiple flares of gout this year we will start him on uric acid lowering therapy as well as stopping his hydrochlorothiazide. - Allopurinol 100 mg daily and colchicine 0.6 mg daily - Uric acid today (5.9)

## 2023-08-08 NOTE — Assessment & Plan Note (Addendum)
Last A1c was 5.3 on 02/23/2023.  He is overdue for a urine microalbumin creatinine ratio.  Continues on Mounjaro 2.5 mg weekly with benefit. - Continue Mounjaro 2.5 mg weekly - Urine microalbumin today was normal at less than 5 - A1c at next visit

## 2023-08-09 DIAGNOSIS — G4733 Obstructive sleep apnea (adult) (pediatric): Secondary | ICD-10-CM | POA: Diagnosis not present

## 2023-08-15 NOTE — Progress Notes (Signed)
Internal Medicine Clinic Attending  Case discussed with Dr. Nikki Dom  At the time of the visit.  We reviewed the resident's history and exam and pertinent patient test results.  I agree with the assessment, diagnosis, and plan of care documented in the resident's note.

## 2023-09-14 ENCOUNTER — Other Ambulatory Visit: Payer: Self-pay | Admitting: Student

## 2023-09-14 ENCOUNTER — Other Ambulatory Visit: Payer: Self-pay | Admitting: Internal Medicine

## 2023-09-14 DIAGNOSIS — I10 Essential (primary) hypertension: Secondary | ICD-10-CM

## 2023-09-14 DIAGNOSIS — E782 Mixed hyperlipidemia: Secondary | ICD-10-CM

## 2023-09-14 NOTE — Telephone Encounter (Signed)
Medication sent to pharmacy

## 2023-09-21 ENCOUNTER — Other Ambulatory Visit: Payer: Self-pay

## 2023-09-21 DIAGNOSIS — E1169 Type 2 diabetes mellitus with other specified complication: Secondary | ICD-10-CM

## 2023-09-21 MED ORDER — SILDENAFIL CITRATE 50 MG PO TABS: 50.0000 mg | ORAL_TABLET | ORAL | 1 refills | Status: AC | PRN

## 2023-09-21 MED ORDER — MOUNJARO 2.5 MG/0.5ML ~~LOC~~ SOAJ: 2.5000 mg | SUBCUTANEOUS | 2 refills | Status: DC

## 2023-10-06 NOTE — Progress Notes (Deleted)
 Travis Powell [address] [place]. [place] [address] [phone]       OFFICE FOLLOW UP NOTE  [name] Date of Birth:  Dec 29, 1970 Medical Record Number:  [id number]   Primary neurologist: Dr. Frances Furbish Reason for visit: CPAP follow-up    SUBJECTIVE:   CHIEF COMPLAINT:  No chief complaint on file.  Follow-up visit:  Prior visit: 10/06/2022  HPI:   Travis Powell is a 53 y.o. male who is being seen for sleep apnea.  He is initially seen by Dr. Frances Furbish on 05/11/2022 for prior diagnosis of OSA and needing new CPAP machine.  Completed HST 06/16/2022 which showed severe OSA with total AHI of 69.8/h and O2 nadir 74% indicating nocturnal hypoxemia.  Recommended restart of AutoPap which was started on 08/06/2022.  At prior visit, compliance report showed excellent usage and optimal residual AHI.  ESS 4/24.   Interval history:   Continues to do well with CPAP.  Compliance report shows excellent usage and optimal residual AHI.  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 Reports doing well on new CPAP machine. Tolerating without any difficulty. Followed by DME [facility]. No questions or concerns today.  Epworth Sleepiness Scale 4/24.  Fatigue severity scale 31/63. Of note, he is scheduled to undergo laparoscopic sleeve gastrectomy on 10/18/2022. Of note, blood pressure elevated today, advised to f/u with PCP if this remains elevated for further management recommendations.         ROS:   14 system review of systems performed and negative with exception of those listed in HPI  PMH:  Past Medical History:  Diagnosis Date   CVA (cerebral vascular accident) (HCC)    DM2 (diabetes mellitus, type 2) (HCC) 06/21/2012   History of kidney stones    HTN (hypertension), benign 06/21/2012   Hyperlipidemia    Left lumbar radiculopathy 06/21/2012   Migraine    Neuropathy    Obesity    Sleep  apnea    uses CPAP at night    PSH:  Past Surgical History:  Procedure Laterality Date   CARPAL TUNNEL RELEASE Left    FRACTURE SURGERY     right hand   LAPAROSCOPIC GASTRIC SLEEVE RESECTION N/A 10/18/2022   Procedure: LAPAROSCOPIC SLEEVE GASTRECTOMY;  Surgeon: Gaynelle Adu, MD;  Location: [facility];  Service: General;  Laterality: N/A;   UPPER GI ENDOSCOPY N/A 10/18/2022   Procedure: UPPER GI ENDOSCOPY;  Surgeon: Gaynelle Adu, MD;  Location: [facility];  Service: General;  Laterality: N/A;    Social History:  Social History   Socioeconomic History   Marital status: Married    Spouse name: Not on file   Number of children: Not on file   Years of education: Not on file   Highest education level: Not on file  Occupational History   Not on file  Tobacco Use   Smoking status: Former    Types: Cigars   Smokeless tobacco: Never  Vaping Use   Vaping status: Never Used  Substance and Sexual Activity   Alcohol use: Yes    Comment: occ    been over a month   Drug use: No   Sexual activity: Yes  Other Topics Concern   Not on file  Social History Narrative   Caffeine soda every now and then.   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  Social Drivers of Corporate investment banker Strain: Low Risk  (06/30/2022)   Overall Financial Resource Strain (CARDIA)    Difficulty of Paying Living Expenses: Not hard at all  Food Insecurity: No Food Insecurity (10/19/2022)   Hunger Vital Sign    Worried About Running Out of Food in the Last Year: Never true    Ran Out of Food in the Last Year: Never true  Transportation Needs: No Transportation Needs (10/22/2022)   PRAPARE - Administrator, Civil Service (Medical): No    Lack of Transportation (Non-Medical): No  Physical Activity: Sufficiently Active (06/30/2022)   Exercise Vital Sign    Days of Exercise per Week: 5 days    Minutes of Exercise per Session: 60 min  Stress: No  Stress Concern Present (06/30/2022)   [employer] of Occupational Health - Occupational Stress Questionnaire    Feeling of Stress : Not at all  Social Connections: Moderately Isolated (06/30/2022)   Social Connection and Isolation Panel [NHANES]    Frequency of Communication with Friends and Family: Three times a week    Frequency of Social Gatherings with Friends and Family: Once a week    Attends Religious Services: Never    Database administrator or Organizations: No    Attends Engineer, structural: Not on file    Marital Status: Married  Catering manager Violence: Not At Risk (10/19/2022)   Humiliation, Afraid, Rape, and Kick questionnaire    Fear of Current or Ex-Partner: No    Emotionally Abused: No    Physically Abused: No    Sexually Abused: No    Family History:  Family History  Problem Relation Age of Onset   Hypertension Mother    Diabetes Mother    Hypertension Father    Deep vein thrombosis Father    Diabetes Brother    Hypertension Brother    CAD Other    Stroke Other     Medications:   Current Outpatient Medications on File Prior to Visit  Medication Sig Dispense Refill   allopurinol (ZYLOPRIM) 100 MG tablet Take 1 tablet (100 mg total) by mouth daily. 30 tablet 2   amLODipine (NORVASC) 10 MG tablet Take 1 tablet (10 mg total) by mouth every morning. 90 tablet 1   aspirin EC 81 MG tablet Take 81 mg by mouth daily.     atorvastatin (LIPITOR) 80 MG tablet TAKE 1 TABLET BY MOUTH EVERY DAY 90 tablet 4   colchicine 0.6 MG tablet Take 1 tablet (0.6 mg total) by mouth daily. 30 tablet 2   olmesartan (BENICAR) 40 MG tablet TAKE 1 TABLET BY MOUTH EVERY DAY 90 tablet 2   sildenafil (VIAGRA) 50 MG tablet Take 1 tablet (50 mg total) by mouth as needed for erectile dysfunction. Take 1 hour before sexual activity 20 tablet 1   spironolactone (ALDACTONE) 25 MG tablet Take 1 tablet (25 mg total) by mouth daily. 90 tablet 3   tamsulosin (FLOMAX) 0.4 MG CAPS  capsule Take 0.4 mg by mouth daily.     tirzepatide [facility]) 2.5 MG/0.5ML Pen Inject 2.5 mg into the skin once a week. 2 mL 2   No current facility-administered medications on file prior to visit.    Allergies:  No Known Allergies    OBJECTIVE:  Physical Exam  There were no vitals filed for this visit.  There is no height or weight on file to calculate BMI. No results found.   General: well developed, well  nourished, very pleasant middle-aged African-American male, seated, in no evident distress Head: head normocephalic and atraumatic.   Neck: supple with no carotid or supraclavicular bruits Cardiovascular: regular rate and rhythm, no murmurs Musculoskeletal: no deformity Skin:  no rash/petichiae Vascular:  Normal pulses all extremities   Neurologic Exam Mental Status: Awake and fully alert. Oriented to place and time. Recent and remote memory intact. Attention span, concentration and fund of knowledge appropriate. Mood and affect appropriate.  Cranial Nerves: Pupils equal, briskly reactive to light. Extraocular movements full without nystagmus. Visual fields full to confrontation. Hearing intact. Facial sensation intact. Face, tongue, palate moves normally and symmetrically.  Motor: Normal bulk and tone. Normal strength in all tested extremity muscles Sensory.: intact to touch , pinprick , position and vibratory sensation.  Coordination: Rapid alternating movements normal in all extremities. Finger-to-nose and heel-to-shin performed accurately bilaterally. Gait and Station: Arises from chair without difficulty. Stance is normal. Gait demonstrates normal stride length and balance without use of AD.  Reflexes: 1+ and symmetric. Toes downgoing.         ASSESSMENT/PLAN: Travis Powell is a 53 y.o. year old male with longstanding history of severe sleep apnea and restart of AutoPap in 07/2022.    OSA on CPAP : Compliance report shows satisfactory usage with optimal  residual AHI.  Continue current pressure settings.  Discussed continued nightly usage with ensuring greater than 4 hours nightly for optimal benefit and per insurance purposes.  Continue to follow with DME company for any needed supplies or CPAP related concerns     Follow up in 1 year or call earlier if needed   CC:  PCP: Morrie Sheldon, MD    I spent 21 minutes of face-to-face and non-face-to-face time with patient.  This included previsit chart review, lab review, study review, order entry, electronic health record documentation, patient education regarding sleep apnea with review and discussion of compliance report and answered all other questions to patient's satisfaction   Ihor Austin, [facility]  [facility] [address] [place], [place] [address]  Phone [phone] Fax [phone] Note: This document was prepared with digital dictation and possible smart phrase technology. Any transcriptional errors that result from this process are unintentional.

## 2023-10-10 ENCOUNTER — Ambulatory Visit: Payer: BC Managed Care – PPO | Admitting: Adult Health

## 2023-10-10 ENCOUNTER — Encounter: Payer: Self-pay | Admitting: Adult Health

## 2023-10-13 DIAGNOSIS — Z13220 Encounter for screening for lipoid disorders: Secondary | ICD-10-CM | POA: Diagnosis not present

## 2023-10-13 DIAGNOSIS — Z713 Dietary counseling and surveillance: Secondary | ICD-10-CM | POA: Diagnosis not present

## 2023-10-13 DIAGNOSIS — I10 Essential (primary) hypertension: Secondary | ICD-10-CM | POA: Diagnosis not present

## 2023-10-13 DIAGNOSIS — Z131 Encounter for screening for diabetes mellitus: Secondary | ICD-10-CM | POA: Diagnosis not present

## 2023-10-13 DIAGNOSIS — Z136 Encounter for screening for cardiovascular disorders: Secondary | ICD-10-CM | POA: Diagnosis not present

## 2023-10-13 DIAGNOSIS — E7800 Pure hypercholesterolemia, unspecified: Secondary | ICD-10-CM | POA: Diagnosis not present

## 2023-10-30 ENCOUNTER — Other Ambulatory Visit: Payer: Self-pay | Admitting: Student

## 2023-10-30 DIAGNOSIS — M1A9XX0 Chronic gout, unspecified, without tophus (tophi): Secondary | ICD-10-CM

## 2023-10-31 NOTE — Telephone Encounter (Signed)
 Medication sent to pharmacy

## 2023-11-03 ENCOUNTER — Ambulatory Visit: Admitting: Student

## 2023-11-03 ENCOUNTER — Encounter: Payer: Self-pay | Admitting: Student

## 2023-11-03 VITALS — BP 137/68 | HR 72 | Temp 98.2°F | Ht 71.0 in | Wt 265.3 lb

## 2023-11-03 DIAGNOSIS — E1169 Type 2 diabetes mellitus with other specified complication: Secondary | ICD-10-CM

## 2023-11-03 DIAGNOSIS — E669 Obesity, unspecified: Secondary | ICD-10-CM

## 2023-11-03 DIAGNOSIS — Z0000 Encounter for general adult medical examination without abnormal findings: Secondary | ICD-10-CM

## 2023-11-03 DIAGNOSIS — E782 Mixed hyperlipidemia: Secondary | ICD-10-CM

## 2023-11-03 DIAGNOSIS — M1A9XX0 Chronic gout, unspecified, without tophus (tophi): Secondary | ICD-10-CM

## 2023-11-03 DIAGNOSIS — Z23 Encounter for immunization: Secondary | ICD-10-CM

## 2023-11-03 DIAGNOSIS — Z6837 Body mass index (BMI) 37.0-37.9, adult: Secondary | ICD-10-CM

## 2023-11-03 DIAGNOSIS — I10 Essential (primary) hypertension: Secondary | ICD-10-CM

## 2023-11-03 DIAGNOSIS — Z7985 Long-term (current) use of injectable non-insulin antidiabetic drugs: Secondary | ICD-10-CM

## 2023-11-03 DIAGNOSIS — E785 Hyperlipidemia, unspecified: Secondary | ICD-10-CM | POA: Diagnosis not present

## 2023-11-03 DIAGNOSIS — I6381 Other cerebral infarction due to occlusion or stenosis of small artery: Secondary | ICD-10-CM

## 2023-11-03 DIAGNOSIS — M109 Gout, unspecified: Secondary | ICD-10-CM

## 2023-11-03 LAB — GLUCOSE, CAPILLARY
Glucose-Capillary: 49 mg/dL — ABNORMAL LOW (ref 70–99)
Glucose-Capillary: 60 mg/dL — ABNORMAL LOW (ref 70–99)

## 2023-11-03 LAB — POCT GLYCOSYLATED HEMOGLOBIN (HGB A1C): Hemoglobin A1C: 5.1 % (ref 4.0–5.6)

## 2023-11-03 NOTE — Patient Instructions (Signed)
 Thank you so much for coming to the clinic today!   Please stop taking your hydrochlorothiazide-losartan and begin the olmesartan 40 mg daily.  I will follow-up regarding your labs.  We will see you in six months.   If you have any questions please feel free to the call the clinic at anytime at [phone]. It was a pleasure seeing you!  Best, Dr. Rayvon Char

## 2023-11-03 NOTE — Assessment & Plan Note (Signed)
 Current medication regimen includes amlodipine 10 mg daily, spironolactone 25 mg daily, and olmesartan 40 mg daily.  Blood pressure today is 137/68.  Blood pressure at home is 130s/60s.  Patient did not start taking his olmesartan as he lost this medication.  Consequently, he has been taking his HCTZ/losartan.  Patient was counseled on stopping the HCTZ/losartan as this may worsen his gout.  He will start his previously prescribed dosage of 40 mg daily of olmesartan. - Begin olmesartan 40 mg daily - Continue amlodipine 10 mg daily and spironolactone 25 mg daily - Follow-up BMP

## 2023-11-03 NOTE — Assessment & Plan Note (Signed)
 Current medication regimen includes Mounjaro 2.5 mg weekly.  Last A1c in 02/2023 was 5.3.  A1c today is 5.1.  Performed foot exam which was normal. - Continue Mounjaro 5 mg weekly

## 2023-11-03 NOTE — Assessment & Plan Note (Signed)
 Received Pneumovax today.

## 2023-11-03 NOTE — Assessment & Plan Note (Signed)
 On atorvastatin 80 mg daily for secondary prevention after CVA in 2023.  No concerns. - Follow-up lipid panel

## 2023-11-03 NOTE — Assessment & Plan Note (Signed)
 At his last visit, he was started on allopurinol 100 mg daily colchicine 0.6 mg daily.  His uric acid on 08/04/2023 was normal at 5.9. No concerns today. - Continue allopurinol 100 mg daily colchicine 0.6 mg daily

## 2023-11-03 NOTE — Progress Notes (Signed)
 CC: chronic condition follow-up   HPI: [name] is a 53 y.o. male living with a history stated below and presents today for chronic condition follow-up. Please see problem based assessment and plan for additional details.  Past Medical History:  Diagnosis Date   CVA (cerebral vascular accident) (HCC)    DM2 (diabetes mellitus, type 2) (HCC) 06/21/2012   History of kidney stones    HTN (hypertension), benign 06/21/2012   Hyperlipidemia    Left lumbar radiculopathy 06/21/2012   Migraine    Neuropathy    Obesity    Sleep apnea    uses CPAP at night    Current Outpatient Medications on File Prior to Visit  Medication Sig Dispense Refill   allopurinol (ZYLOPRIM) 100 MG tablet TAKE 1 TABLET BY MOUTH EVERY DAY 90 tablet 2   amLODipine (NORVASC) 10 MG tablet Take 1 tablet (10 mg total) by mouth every morning. 90 tablet 1   aspirin EC 81 MG tablet Take 81 mg by mouth daily.     atorvastatin (LIPITOR) 80 MG tablet TAKE 1 TABLET BY MOUTH EVERY DAY 90 tablet 4   colchicine 0.6 MG tablet Take 1 tablet (0.6 mg total) by mouth daily. 30 tablet 2   olmesartan (BENICAR) 40 MG tablet TAKE 1 TABLET BY MOUTH EVERY DAY 90 tablet 2   sildenafil (VIAGRA) 50 MG tablet Take 1 tablet (50 mg total) by mouth as needed for erectile dysfunction. Take 1 hour before sexual activity 20 tablet 1   spironolactone (ALDACTONE) 25 MG tablet Take 1 tablet (25 mg total) by mouth daily. 90 tablet 3   tamsulosin (FLOMAX) 0.4 MG CAPS capsule Take 0.4 mg by mouth daily.     tirzepatide [facility]) 2.5 MG/0.5ML Pen Inject 2.5 mg into the skin once a week. 2 mL 2   No current facility-administered medications on file prior to visit.    Family History  Problem Relation Age of Onset   Hypertension Mother    Diabetes Mother    Hypertension Father    Deep vein thrombosis Father    Diabetes Brother    Hypertension Brother    CAD Other    Stroke Other     Social History   Socioeconomic History   Marital  status: Married    Spouse name: Not on file   Number of children: Not on file   Years of education: Not on file   Highest education level: Not on file  Occupational History   Not on file  Tobacco Use   Smoking status: Former    Types: Cigars   Smokeless tobacco: Never  Vaping Use   Vaping status: Never Used  Substance and Sexual Activity   Alcohol use: Yes    Comment: occ    been over a month   Drug use: No   Sexual activity: Yes  Other Topics Concern   Not on file  Social History Narrative   Caffeine soda every now and then.   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    Social Drivers of Corporate investment banker Strain: Low Risk  (06/30/2022)   Overall Financial Resource Strain (CARDIA)    Difficulty of Paying Living Expenses: Not hard at all  Food Insecurity: No Food Insecurity (10/19/2022)   Hunger Vital Sign    Worried About Running Out of Food in the Last Year: Never true    Ran Out  of Food in the Last Year: Never true  Transportation Needs: No Transportation Needs (10/22/2022)   PRAPARE - Administrator, Civil Service (Medical): No    Lack of Transportation (Non-Medical): No  Physical Activity: Sufficiently Active (06/30/2022)   Exercise Vital Sign    Days of Exercise per Week: 5 days    Minutes of Exercise per Session: 60 min  Stress: No Stress Concern Present (06/30/2022)   [employer] of Occupational Health - Occupational Stress Questionnaire    Feeling of Stress : Not at all  Social Connections: Moderately Isolated (06/30/2022)   Social Connection and Isolation Panel [NHANES]    Frequency of Communication with Friends and Family: Three times a week    Frequency of Social Gatherings with Friends and Family: Once a week    Attends Religious Services: Never    Database administrator or Organizations: No    Attends Engineer, structural: Not on file    Marital Status: Married   Catering manager Violence: Not At Risk (10/19/2022)   Humiliation, Afraid, Rape, and Kick questionnaire    Fear of Current or Ex-Partner: No    Emotionally Abused: No    Physically Abused: No    Sexually Abused: No    Review of Systems: ROS negative except for what is noted on the assessment and plan.  Vitals:   11/03/23 1001  BP: 137/68  Pulse: 72  Temp: 98.2 F (36.8 C)  TempSrc: Oral  SpO2: 100%  Weight: 265 lb 4.8 oz (120.3 kg)  Height: 5\' 11"  (1.803 m)    Physical Exam: Constitutional: well-appearing in no acute distress HENT: normocephalic atraumatic, mucous membranes moist Eyes: conjunctiva non-erythematous Neck: supple Cardiovascular: regular rate and rhythm, no m/r/g Pulmonary/Chest: normal work of breathing on room air, lungs clear to auscultation bilaterally Abdominal: soft, non-tender, non-distended MSK: normal bulk and tone Neurological: alert & oriented x 3, 5/5 strength in bilateral upper and lower extremities, normal gait Skin: warm and dry  Assessment & Plan:   Healthcare maintenance Received Pneumovax today.  Hyperlipidemia On atorvastatin 80 mg daily for secondary prevention after CVA in 2023.  No concerns. - Follow-up lipid panel  Gout At his last visit, he was started on allopurinol 100 mg daily colchicine 0.6 mg daily.  His uric acid on 08/04/2023 was normal at 5.9. No concerns today. - Continue allopurinol 100 mg daily colchicine 0.6 mg daily  Type 2 diabetes mellitus with obesity (HCC) Current medication regimen includes Mounjaro 2.5 mg weekly.  Last A1c in 02/2023 was 5.3.  A1c today is 5.1.  Performed foot exam which was normal. - Continue Mounjaro 5 mg weekly  HTN (hypertension), benign Current medication regimen includes amlodipine 10 mg daily, spironolactone 25 mg daily, and olmesartan 40 mg daily.  Blood pressure today is 137/68.  Blood pressure at home is 130s/60s.  Patient did not start taking his olmesartan as he lost this  medication.  Consequently, he has been taking his HCTZ/losartan.  Patient was counseled on stopping the HCTZ/losartan as this may worsen his gout.  He will start his previously prescribed dosage of 40 mg daily of olmesartan. - Begin olmesartan 40 mg daily - Continue amlodipine 10 mg daily and spironolactone 25 mg daily - Follow-up BMP  Patient discussed with Dr. Bary Leriche, MD  [facility] Internal Medicine, PGY-1 Date 11/03/2023 Time 10:29 AM

## 2023-11-04 ENCOUNTER — Encounter: Payer: Self-pay | Admitting: Student

## 2023-11-04 LAB — BMP8+ANION GAP
Anion Gap: 19 mmol/L — ABNORMAL HIGH (ref 10.0–18.0)
BUN/Creatinine Ratio: 20 (ref 9–20)
BUN: 24 mg/dL (ref 6–24)
CO2: 21 mmol/L (ref 20–29)
Calcium: 9.4 mg/dL (ref 8.7–10.2)
Chloride: 102 mmol/L (ref 96–106)
Creatinine, Ser: 1.19 mg/dL (ref 0.76–1.27)
Glucose: 53 mg/dL — ABNORMAL LOW (ref 70–99)
Potassium: 4.1 mmol/L (ref 3.5–5.2)
Sodium: 142 mmol/L (ref 134–144)
eGFR: 73 mL/min/{1.73_m2} (ref >59)

## 2023-11-04 LAB — LIPID PANEL
Chol/HDL Ratio: 3 ratio (ref 0.0–5.0)
Cholesterol, Total: 144 mg/dL (ref 100–199)
HDL: 48 mg/dL (ref >39)
LDL Chol Calc (NIH): 84 mg/dL (ref 0–99)
Triglycerides: 54 mg/dL (ref 0–149)
VLDL Cholesterol Cal: 12 mg/dL (ref 5–40)

## 2023-11-07 DIAGNOSIS — G4733 Obstructive sleep apnea (adult) (pediatric): Secondary | ICD-10-CM | POA: Diagnosis not present

## 2023-11-09 ENCOUNTER — Telehealth: Payer: Self-pay

## 2023-11-09 NOTE — Telephone Encounter (Signed)
 Called and spoke with pt about scheduling his appt to check on his prostate. Pt is scheduled on 11/11/2023 @ 9:15.

## 2023-11-09 NOTE — Telephone Encounter (Signed)
 Copied from CRM [id number]. Topic: Referral - Question >> Nov 08, 2023 11:23 AM Philippa Chester F wrote: Reason for CRM:   Caller:    Patient is calling in reference to seeing the process of having his prostate checked. Patient would like to begin the process; Patient is experiencing not any symptoms just knows he has had his colon checked but not his prostate and after receiving bad news from a friend in regards to his buddy having prostate cancer he wanted to get his checked just as a precaution.   Patient would like to be advised on next steps in getting an examination.   Phone number to call:  [phone]

## 2023-11-11 ENCOUNTER — Ambulatory Visit: Admitting: Student

## 2023-11-11 VITALS — BP 137/74 | HR 58 | Temp 98.1°F | Ht 71.0 in | Wt 263.2 lb

## 2023-11-11 DIAGNOSIS — G4733 Obstructive sleep apnea (adult) (pediatric): Secondary | ICD-10-CM

## 2023-11-11 DIAGNOSIS — Z6836 Body mass index (BMI) 36.0-36.9, adult: Secondary | ICD-10-CM

## 2023-11-11 DIAGNOSIS — I10 Essential (primary) hypertension: Secondary | ICD-10-CM

## 2023-11-11 DIAGNOSIS — Z0000 Encounter for general adult medical examination without abnormal findings: Secondary | ICD-10-CM | POA: Diagnosis not present

## 2023-11-11 NOTE — Assessment & Plan Note (Signed)
 Patient presents today after calling the office open to have his prostate checked.  He is asymptomatic.  He is feeling anxious as he just received news from a friend that was diagnosed with prostate cancer and wanted to get this checked as a precaution. - Follow-up PSA

## 2023-11-11 NOTE — Assessment & Plan Note (Signed)
 OSA treated with CPAP right now.  Patient also notes taking his hypertension medication prior to visit.  Stressed importance of regularly taking his medication and for monitoring at home.

## 2023-11-11 NOTE — Patient Instructions (Signed)
 Thank you so much for coming to the clinic today!   I will follow up regarding your lab.   If you have any questions please feel free to the call the clinic at anytime at [phone]. It was a pleasure seeing you!  Best, Dr. Rayvon Char

## 2023-11-11 NOTE — Progress Notes (Signed)
 CC: Prostate check  HPI: [name] is a 53 y.o. male living with a history stated below and presents today for prostate check. Please see problem based assessment and plan for additional details.  Past Medical History:  Diagnosis Date   CVA (cerebral vascular accident) (HCC)    DM2 (diabetes mellitus, type 2) (HCC) 06/21/2012   History of kidney stones    HTN (hypertension), benign 06/21/2012   Hyperlipidemia    Left lumbar radiculopathy 06/21/2012   Migraine    Neuropathy    Obesity    Sleep apnea    uses CPAP at night    Current Outpatient Medications on File Prior to Visit  Medication Sig Dispense Refill   allopurinol (ZYLOPRIM) 100 MG tablet TAKE 1 TABLET BY MOUTH EVERY DAY 90 tablet 2   amLODipine (NORVASC) 10 MG tablet Take 1 tablet (10 mg total) by mouth every morning. 90 tablet 1   aspirin EC 81 MG tablet Take 81 mg by mouth daily.     atorvastatin (LIPITOR) 80 MG tablet TAKE 1 TABLET BY MOUTH EVERY DAY 90 tablet 4   colchicine 0.6 MG tablet Take 1 tablet (0.6 mg total) by mouth daily. 30 tablet 2   olmesartan (BENICAR) 40 MG tablet TAKE 1 TABLET BY MOUTH EVERY DAY 90 tablet 2   sildenafil (VIAGRA) 50 MG tablet Take 1 tablet (50 mg total) by mouth as needed for erectile dysfunction. Take 1 hour before sexual activity 20 tablet 1   spironolactone (ALDACTONE) 25 MG tablet Take 1 tablet (25 mg total) by mouth daily. 90 tablet 3   tamsulosin (FLOMAX) 0.4 MG CAPS capsule Take 0.4 mg by mouth daily.     tirzepatide [facility]) 2.5 MG/0.5ML Pen Inject 2.5 mg into the skin once a week. 2 mL 2   No current facility-administered medications on file prior to visit.    Family History  Problem Relation Age of Onset   Hypertension Mother    Diabetes Mother    Hypertension Father    Deep vein thrombosis Father    Diabetes Brother    Hypertension Brother    CAD Other    Stroke Other     Social History   Socioeconomic History   Marital status: Married    Spouse  name: Not on file   Number of children: Not on file   Years of education: Not on file   Highest education level: Not on file  Occupational History   Not on file  Tobacco Use   Smoking status: Former    Types: Cigars   Smokeless tobacco: Never  Vaping Use   Vaping status: Never Used  Substance and Sexual Activity   Alcohol use: Yes    Comment: occ    been over a month   Drug use: No   Sexual activity: Yes  Other Topics Concern   Not on file  Social History Narrative   Caffeine soda every now and then.   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    Social Drivers of Corporate investment banker Strain: Low Risk  (06/30/2022)   Overall Financial Resource Strain (CARDIA)    Difficulty of Paying Living Expenses: Not hard at all  Food Insecurity: No Food Insecurity (10/19/2022)   Hunger Vital Sign    Worried About Running Out of Food in the Last Year: Never true    Ran Out of Food in  the Last Year: Never true  Transportation Needs: No Transportation Needs (10/22/2022)   PRAPARE - Administrator, Civil Service (Medical): No    Lack of Transportation (Non-Medical): No  Physical Activity: Sufficiently Active (06/30/2022)   Exercise Vital Sign    Days of Exercise per Week: 5 days    Minutes of Exercise per Session: 60 min  Stress: No Stress Concern Present (06/30/2022)   [employer] of Occupational Health - Occupational Stress Questionnaire    Feeling of Stress : Not at all  Social Connections: Moderately Isolated (06/30/2022)   Social Connection and Isolation Panel [NHANES]    Frequency of Communication with Friends and Family: Three times a week    Frequency of Social Gatherings with Friends and Family: Once a week    Attends Religious Services: Never    Database administrator or Organizations: No    Attends Engineer, structural: Not on file    Marital Status: Married  Catering manager Violence:  Not At Risk (10/19/2022)   Humiliation, Afraid, Rape, and Kick questionnaire    Fear of Current or Ex-Partner: No    Emotionally Abused: No    Physically Abused: No    Sexually Abused: No    Review of Systems: ROS negative except for what is noted on the assessment and plan.  Vitals:   11/11/23 0848  BP: (!) 144/81  Pulse: 62  Temp: 98.1 F (36.7 C)  TempSrc: Oral  SpO2: 100%  Weight: 263 lb 3.2 oz (119.4 kg)  Height: 5\' 11"  (1.803 m)    Physical Exam: Constitutional: well-appearing in no acute distress HENT: normocephalic atraumatic, mucous membranes moist Eyes: conjunctiva non-erythematous Neck: supple Cardiovascular: regular rate and rhythm, no m/r/g Pulmonary/Chest: normal work of breathing on room air, lungs clear to auscultation bilaterally Abdominal: soft, non-tender, non-distended MSK: normal bulk and tone Neurological: alert & oriented x 3, 5/5 strength in bilateral upper and lower extremities, normal gait Skin: warm and dry  Assessment & Plan:   Morbid obesity (HCC) OSA treated with CPAP right now.  Patient also notes taking his hypertension medication prior to visit.  Stressed importance of regularly taking his medication and for monitoring at home.   Healthcare maintenance Patient presents today after calling the office open to have his prostate checked.  He is asymptomatic.  He is feeling anxious as he just received news from a friend that was diagnosed with prostate cancer and wanted to get this checked as a precaution. - Follow-up PSA  Patient discussed with Dr. Collier Flowers, MD  [facility] Internal Medicine, PGY-1 Date 11/11/2023 Time 9:06 AM

## 2023-11-12 LAB — PSA: Prostate Specific Ag, Serum: 0.5 ng/mL (ref 0.0–4.0)

## 2023-11-12 NOTE — Progress Notes (Signed)
 Internal Medicine Clinic Attending  Case discussed with the resident at the time of the visit.  We reviewed the resident's history and exam and pertinent patient test results.  I agree with the assessment, diagnosis, and plan of care documented in the resident's note.

## 2023-11-12 NOTE — Addendum Note (Signed)
 Addended by: Burnell Blanks on: 11/12/2023 03:16 PM   Modules accepted: Level of Service

## 2023-11-13 NOTE — Progress Notes (Signed)
 Internal Medicine Clinic Attending  Case discussed with the resident at the time of the visit.  We reviewed the resident's history and exam and pertinent patient test results.  I agree with the assessment, diagnosis, and plan of care documented in the resident's note.

## 2023-11-13 NOTE — Addendum Note (Signed)
 Addended by: Gust Rung on: 11/13/2023 07:18 PM   Modules accepted: Level of Service

## 2023-11-16 ENCOUNTER — Encounter: Payer: Self-pay | Admitting: Student

## 2024-01-16 ENCOUNTER — Other Ambulatory Visit: Payer: Self-pay | Admitting: Student

## 2024-01-16 DIAGNOSIS — E1169 Type 2 diabetes mellitus with other specified complication: Secondary | ICD-10-CM

## 2024-01-19 ENCOUNTER — Other Ambulatory Visit: Payer: Self-pay | Admitting: Student

## 2024-01-19 DIAGNOSIS — I10 Essential (primary) hypertension: Secondary | ICD-10-CM

## 2024-01-19 NOTE — Telephone Encounter (Signed)
 Medication sent to pharmacy

## 2024-01-25 ENCOUNTER — Encounter: Payer: Self-pay | Admitting: *Deleted

## 2024-02-07 DIAGNOSIS — G4733 Obstructive sleep apnea (adult) (pediatric): Secondary | ICD-10-CM | POA: Diagnosis not present

## 2024-04-30 ENCOUNTER — Other Ambulatory Visit: Payer: Self-pay

## 2024-04-30 DIAGNOSIS — I10 Essential (primary) hypertension: Secondary | ICD-10-CM

## 2024-04-30 MED ORDER — SPIRONOLACTONE 25 MG PO TABS: 25.0000 mg | ORAL_TABLET | Freq: Every day | ORAL | 3 refills | Status: AC

## 2024-05-09 ENCOUNTER — Encounter (HOSPITAL_COMMUNITY): Payer: Self-pay | Admitting: *Deleted

## 2024-05-14 DIAGNOSIS — G4733 Obstructive sleep apnea (adult) (pediatric): Secondary | ICD-10-CM | POA: Diagnosis not present

## 2024-07-16 ENCOUNTER — Other Ambulatory Visit: Payer: Self-pay

## 2024-07-16 DIAGNOSIS — I10 Essential (primary) hypertension: Secondary | ICD-10-CM

## 2024-07-16 MED ORDER — AMLODIPINE BESYLATE 10 MG PO TABS: 10.0000 mg | ORAL_TABLET | ORAL | 1 refills | Status: AC

## 2024-07-16 NOTE — Telephone Encounter (Signed)
 Medication sent to pharmacy

## 2024-07-19 ENCOUNTER — Other Ambulatory Visit: Payer: Self-pay

## 2024-07-19 DIAGNOSIS — E669 Obesity, unspecified: Secondary | ICD-10-CM

## 2024-07-20 MED ORDER — MOUNJARO 2.5 MG/0.5ML ~~LOC~~ SOAJ: 5.0000 mg | SUBCUTANEOUS | 2 refills | Status: DC

## 2024-09-19 ENCOUNTER — Encounter: Payer: Self-pay | Admitting: Student

## 2024-09-19 ENCOUNTER — Other Ambulatory Visit: Payer: Self-pay

## 2024-09-19 ENCOUNTER — Ambulatory Visit: Payer: Self-pay | Admitting: Student

## 2024-09-19 VITALS — BP 146/82 | HR 65 | Temp 98.1°F | Ht 71.0 in | Wt 262.0 lb

## 2024-09-19 DIAGNOSIS — Z01818 Encounter for other preprocedural examination: Secondary | ICD-10-CM | POA: Insufficient documentation

## 2024-09-19 DIAGNOSIS — M1A9XX0 Chronic gout, unspecified, without tophus (tophi): Secondary | ICD-10-CM

## 2024-09-19 DIAGNOSIS — Z6841 Body Mass Index (BMI) 40.0 and over, adult: Secondary | ICD-10-CM

## 2024-09-19 DIAGNOSIS — E782 Mixed hyperlipidemia: Secondary | ICD-10-CM

## 2024-09-19 DIAGNOSIS — E669 Obesity, unspecified: Secondary | ICD-10-CM

## 2024-09-19 DIAGNOSIS — I10 Essential (primary) hypertension: Secondary | ICD-10-CM

## 2024-09-19 LAB — POCT GLYCOSYLATED HEMOGLOBIN (HGB A1C): HbA1c, POC (controlled diabetic range): 5.2 % (ref 0.0–7.0)

## 2024-09-19 LAB — GLUCOSE, CAPILLARY: Glucose-Capillary: 92 mg/dL (ref 70–99)

## 2024-09-19 MED ORDER — MOUNJARO 10 MG/0.5ML ~~LOC~~ SOAJ: 10.0000 mg | SUBCUTANEOUS | 0 refills | Status: AC

## 2024-09-19 MED ORDER — MOUNJARO 5 MG/0.5ML ~~LOC~~ SOAJ: 5.0000 mg | SUBCUTANEOUS | 0 refills | Status: AC

## 2024-09-19 MED ORDER — MOUNJARO 7.5 MG/0.5ML ~~LOC~~ SOAJ: 7.5000 mg | SUBCUTANEOUS | 0 refills | Status: AC

## 2024-09-19 MED ORDER — ALLOPURINOL 100 MG PO TABS: 100.0000 mg | ORAL_TABLET | Freq: Every day | ORAL | 2 refills | Status: AC

## 2024-09-19 NOTE — Progress Notes (Signed)
 "  Established Patient Office Visit  Subjective   Patient ID: Travis Powell, male    DOB: [birth year]  Age: 55 y.o. MRN: [id number]  Chief Complaint  Patient presents with   Follow-up    Medical clearance    Diabetes   Hypertension   Hyperlipidemia    [name] is a 54 y.o. male with past medical history of CVA, hypertension, OSA, type 2 diabetes, ED, gout, hyperlipidemia, prior lap sleeve gastrectomy presents today for medical clearance for Laminectomy L3-L4, date TBA.  Medications: CVA 10/2022: Aspirin  and atorvastatin  40 mg Hypertension: Amlodipine  10 mg daily, spironolactone  25 mg daily, Benicar  40 mg daily  OSA: CPAP Gout: Colchicine  0.6 mg daily, allopurinol  100 mg daily Type 2 diabetes with obesity: Mounjaro  5 mg weekly, Erectile dysfunction: Viagra  50 mg as needed  Review of Systems:  As per assessment and Plan   Objective:     Vitals:   09/19/24 1038  BP: (!) 146/82  Pulse: 65  Temp: 98.1 F (36.7 C)  TempSrc: Oral  SpO2: 100%  Weight: 262 lb (118.8 kg)  Height: 5' 11 (1.803 m)   Physical Exam General: Sitting in chair, no acute distress Cardiovascular: Regular rate Pulmonary: Breathing comfortably Abdomen: Soft, nontender, nondistended MSK: No lower extremity edema bilaterally     Assessment & Plan:   Patient discussed with Dr. Trudy Assessment & Plan Encounter for preoperative assessment Presents today for preop assessment.  He reports that he is unclear whether he will get surgery soon though he still wants to perioperative clearance. 1) Preoperative clearance for laminectomy L3-L4- Request by Dr. Cordella Rhein for preoperative clearance prior to surgery. Surgery is scheduled for TBA. LOW RISK SURGERY.  ID - Patient denies recent illness, fevers, chills, cough, congestion, sick contacts.  Functional capacity - admits to ability to take care of self, such as eat, dress or use the toilet. reports ability to walk up a flight of steps or a  hill. denies ability to do heavy work around the house such as scrubbing floors or lifting or moving heavy furniture, Reports that he can sweep his house. denies ability to participate in strenuous sports such as swimming, singles tennis, football, basketball, and skiing. Cardiovascular status - Patient admits to history of stroke (03/2022). ECHO 04/2022 showed LVEF 60-65%, normal LV function, G1DD. No history of MI, renal insufficiency. Has a history of diabetes. Denies chest pain, shortness of breath, palpitations, tachycardia.  Anesthesia - Patient denies past history of adverse reaction to anesthesia.  Seizure history - Patient denies prior history of seizure disorder.  Pulmonary function - Patient does have ongoing tobacco use; smokes cigar once every couple of weeks. The patient currently denies acute pulmonary complaints including, but not limited to shortness of breath, difficulty breathing, cough.  Hematologic status - patient denies prior easy bruising, easy bleeding. No known bleeding disorders. denies have history of anemia, with baseline hemoglobin of 13-14.  - Preoperative risk assessment shows low risk surgery, patient will need to hold aspirin  and Mounjaro  7 days prior to surgery.  He can continue his rest of the medications until his surgery. -Based on prior serum creatinine, RCRI score 0.5%. Mixed hyperlipidemia Lab Results  Component Value Date   CHOL 144 11/03/2023   HDL 48 11/03/2023   LDLCALC 84 11/03/2023   TRIG 54 11/03/2023   CHOLHDL 3.0 11/03/2023  Medication consist of atorvastatin  40 mg daily.  Patient is due for lipid panel. - Follow lipid panel and continue atorvastatin  40 mg  daily Chronic gout involving toe of right foot without tophus, unspecified cause Home medication consist of colchicine  0.6 mg daily and allopurinol  100 mg, reports that he has not taken the colchicine  at all, and he has taken the allopurinol  as needed.  Denies any new gout flare over the course of  a year. -Continue allopurinol  100 mg daily; stop colchicine  Body mass index (BMI) 45.0-49.9, adult (HCC) Filed Weights   09/19/24 1038  Weight: 262 lb (118.8 kg)  Patient is currently taking Mounjaro  2.5 mg weekly injections, there has been been any changes to his weight compared to last year.  He is tolerating the Mounjaro  well, denies any nausea, vomiting, diarrhea. -Mounjaro  ladder with increasing every 2.5 mg monthly [Mounjaro  5 mg > Mounjaro  7.5 mg> Mounjaro  10 mg] return to clinic in 3 months to reevaluate. Type 2 diabetes mellitus in patient with obesity [facility]) Lab Results  Component Value Date   HGBA1C 5.2 09/19/2024   HGBA1C 5.1 11/03/2023   HGBA1C 5.3 02/23/2023  Home medication consist of Mounjaro  2.5 mg weekly; Tolerating it well.   - Increase Mounjaro  every 2.5 mg monthly - Ophthalmology referral is sent - Order Urine ACR today HTN (hypertension), benign BP Readings from Last 3 Encounters:  09/19/24 (!) 146/82  11/11/23 137/74  11/03/23 137/68   Lab Results  Component Value Date   CREATININE 1.19 11/03/2023   CREATININE 1.02 10/19/2022   CREATININE 1.10 10/12/2022    Home medications consist of: Amlodipine  10 mg daily, spironolactone  25 mg daily, Benicar  40 mg daily  Denies any symptoms of dizziness, lightheadedness, vision changes, chest pain, shortness of breath, lower extremity swelling - Continue: Amlodipine  10 mg daily, spironolactone  25 mg daily, Benicar  40 mg daily  - Ordered BMP to check for Scr and electrolytes   Problem List Items Addressed This Visit       Cardiovascular and Mediastinum   HTN (hypertension), benign (Chronic)   BP Readings from Last 3 Encounters:  09/19/24 (!) 146/82  11/11/23 137/74  11/03/23 137/68   Lab Results  Component Value Date   CREATININE 1.19 11/03/2023   CREATININE 1.02 10/19/2022   CREATININE 1.10 10/12/2022    Home medications consist of: Amlodipine  10 mg daily, spironolactone  25 mg daily, Benicar  40 mg daily   Denies any symptoms of dizziness, lightheadedness, vision changes, chest pain, shortness of breath, lower extremity swelling - Continue: Amlodipine  10 mg daily, spironolactone  25 mg daily, Benicar  40 mg daily  - Ordered BMP to check for Scr and electrolytes       Relevant Orders   Basic metabolic panel with GFR     Endocrine   Type 2 diabetes mellitus in patient with obesity (HCC)   Lab Results  Component Value Date   HGBA1C 5.2 09/19/2024   HGBA1C 5.1 11/03/2023   HGBA1C 5.3 02/23/2023  Home medication consist of Mounjaro  2.5 mg weekly; Tolerating it well.   - Increase Mounjaro  every 2.5 mg monthly - Ophthalmology referral is sent - Order Urine ACR today      Relevant Medications   tirzepatide  (MOUNJARO ) 5 MG/0.5ML Pen   tirzepatide  (MOUNJARO ) 7.5 MG/0.5ML Pen (Start on 10/18/2024)   tirzepatide  (MOUNJARO ) 10 MG/0.5ML Pen (Start on 11/16/2024)   Other Relevant Orders   POC Hbg A1C (Completed)   Microalbumin / Creatinine Urine Ratio   Glucose, capillary (Completed)   Ambulatory referral to Ophthalmology     Other   Hyperlipidemia (Chronic)   Lab Results  Component Value Date   CHOL 144 11/03/2023  HDL 48 11/03/2023   LDLCALC 84 11/03/2023   TRIG 54 11/03/2023   CHOLHDL 3.0 11/03/2023  Medication consist of atorvastatin  40 mg daily.  Patient is due for lipid panel. - Follow lipid panel and continue atorvastatin  40 mg daily      Relevant Orders   Lipid Profile   Gout   Home medication consist of colchicine  0.6 mg daily and allopurinol  100 mg, reports that he has not taken the colchicine  at all, and he has taken the allopurinol  as needed.  Denies any new gout flare over the course of a year. -Continue allopurinol  100 mg daily; stop colchicine       Relevant Medications   allopurinol  (ZYLOPRIM ) 100 MG tablet   Body mass index (BMI) 45.0-49.9, adult (HCC)   Filed Weights   09/19/24 1038  Weight: 262 lb (118.8 kg)  Patient is currently taking Mounjaro  2.5 mg weekly  injections, there has been been any changes to his weight compared to last year.  He is tolerating the Mounjaro  well, denies any nausea, vomiting, diarrhea. -Mounjaro  ladder with increasing every 2.5 mg monthly [Mounjaro  5 mg > Mounjaro  7.5 mg> Mounjaro  10 mg] return to clinic in 3 months to reevaluate.      Relevant Medications   tirzepatide  (MOUNJARO ) 5 MG/0.5ML Pen   tirzepatide  (MOUNJARO ) 7.5 MG/0.5ML Pen (Start on 10/18/2024)   tirzepatide  (MOUNJARO ) 10 MG/0.5ML Pen (Start on 11/16/2024)   Preoperative clearance - Primary   Presents today for preop assessment.  He reports that he is unclear whether he will get surgery soon though he still wants to perioperative clearance. 1) Preoperative clearance for laminectomy L3-L4- Request by Dr. Cordella Rhein for preoperative clearance prior to surgery. Surgery is scheduled for TBA. LOW RISK SURGERY.  ID - Patient denies recent illness, fevers, chills, cough, congestion, sick contacts.  Functional capacity - admits to ability to take care of self, such as eat, dress or use the toilet. reports ability to walk up a flight of steps or a hill. denies ability to do heavy work around the house such as scrubbing floors or lifting or moving heavy furniture, Reports that he can sweep his house. denies ability to participate in strenuous sports such as swimming, singles tennis, football, basketball, and skiing. Cardiovascular status - Patient admits to history of stroke (03/2022). ECHO 04/2022 showed LVEF 60-65%, normal LV function, G1DD. No history of MI, renal insufficiency. Has a history of diabetes. Denies chest pain, shortness of breath, palpitations, tachycardia.  Anesthesia - Patient denies past history of adverse reaction to anesthesia.  Seizure history - Patient denies prior history of seizure disorder.  Pulmonary function - Patient does have ongoing tobacco use; smokes cigar once every couple of weeks. The patient currently denies acute pulmonary complaints  including, but not limited to shortness of breath, difficulty breathing, cough.  Hematologic status - patient denies prior easy bruising, easy bleeding. No known bleeding disorders. denies have history of anemia, with baseline hemoglobin of 13-14.  - Preoperative risk assessment shows low risk surgery, patient will need to hold aspirin  and Mounjaro  7 days prior to surgery.  He can continue his rest of the medications until his surgery. -Based on prior serum creatinine, RCRI score 0.5%.       Return in about 3 months (around 12/17/2024) for DM, A1c check.    Toma Edwards, DO "

## 2024-09-19 NOTE — Assessment & Plan Note (Addendum)
 Presents today for preop assessment.  He reports that he is unclear whether he will get surgery soon though he still wants to perioperative clearance. 1) Preoperative clearance for laminectomy L3-L4- Request by Dr. Cordella Rhein for preoperative clearance prior to surgery. Surgery is scheduled for TBA. LOW RISK SURGERY.  ID - Patient denies recent illness, fevers, chills, cough, congestion, sick contacts.  Functional capacity - admits to ability to take care of self, such as eat, dress or use the toilet. reports ability to walk up a flight of steps or a hill. denies ability to do heavy work around the house such as scrubbing floors or lifting or moving heavy furniture, Reports that he can sweep his house. denies ability to participate in strenuous sports such as swimming, singles tennis, football, basketball, and skiing. Cardiovascular status - Patient admits to history of stroke (03/2022). ECHO 04/2022 showed LVEF 60-65%, normal LV function, G1DD. No history of MI, renal insufficiency. Has a history of diabetes. Denies chest pain, shortness of breath, palpitations, tachycardia.  Anesthesia - Patient denies past history of adverse reaction to anesthesia.  Seizure history - Patient denies prior history of seizure disorder.  Pulmonary function - Patient does have ongoing tobacco use; smokes cigar once every couple of weeks. The patient currently denies acute pulmonary complaints including, but not limited to shortness of breath, difficulty breathing, cough.  Hematologic status - patient denies prior easy bruising, easy bleeding. No known bleeding disorders. denies have history of anemia, with baseline hemoglobin of 13-14.  - Preoperative risk assessment shows low risk surgery, patient will need to hold aspirin  and Mounjaro  7 days prior to surgery.  He can continue his rest of the medications until his surgery. -Based on prior serum creatinine, RCRI score 0.5%.

## 2024-09-19 NOTE — Assessment & Plan Note (Addendum)
 Lab Results  Component Value Date   CHOL 144 11/03/2023   HDL 48 11/03/2023   LDLCALC 84 11/03/2023   TRIG 54 11/03/2023   CHOLHDL 3.0 11/03/2023  Medication consist of atorvastatin  40 mg daily.  Patient is due for lipid panel. - Follow lipid panel and continue atorvastatin  40 mg daily

## 2024-09-19 NOTE — Assessment & Plan Note (Addendum)
 Home medication consist of colchicine  0.6 mg daily and allopurinol  100 mg, reports that he has not taken the colchicine  at all, and he has taken the allopurinol  as needed.  Denies any new gout flare over the course of a year. -Continue allopurinol  100 mg daily; stop colchicine 

## 2024-09-19 NOTE — Assessment & Plan Note (Addendum)
 Filed Weights   09/19/24 1038  Weight: 262 lb (118.8 kg)  Patient is currently taking Mounjaro  2.5 mg weekly injections, there has been been any changes to his weight compared to last year.  He is tolerating the Mounjaro  well, denies any nausea, vomiting, diarrhea. -Mounjaro  ladder with increasing every 2.5 mg monthly [Mounjaro  5 mg > Mounjaro  7.5 mg> Mounjaro  10 mg] return to clinic in 3 months to reevaluate.

## 2024-09-19 NOTE — Assessment & Plan Note (Addendum)
 BP Readings from Last 3 Encounters:  09/19/24 (!) 146/82  11/11/23 137/74  11/03/23 137/68   Lab Results  Component Value Date   CREATININE 1.19 11/03/2023   CREATININE 1.02 10/19/2022   CREATININE 1.10 10/12/2022    Home medications consist of: Amlodipine  10 mg daily, spironolactone  25 mg daily, Benicar  40 mg daily  Denies any symptoms of dizziness, lightheadedness, vision changes, chest pain, shortness of breath, lower extremity swelling - Continue: Amlodipine  10 mg daily, spironolactone  25 mg daily, Benicar  40 mg daily  - Ordered BMP to check for Scr and electrolytes

## 2024-09-19 NOTE — Assessment & Plan Note (Addendum)
 Lab Results  Component Value Date   HGBA1C 5.2 09/19/2024   HGBA1C 5.1 11/03/2023   HGBA1C 5.3 02/23/2023  Home medication consist of Mounjaro  2.5 mg weekly; Tolerating it well.   - Increase Mounjaro  every 2.5 mg monthly - Ophthalmology referral is sent - Order Urine ACR today

## 2024-09-19 NOTE — Patient Instructions (Signed)
 Thank you, Travis Powell for allowing us  to provide your care today.    For your Diabetes:  - Pick up Mounjaro  5 mg, inject once a week for 4 weeks - Then increase it to Mounjaro  7.5 mg, once a week for 4 weeks - Then increase it to Mounjaro  10 mg, once a week for 4 weeks   I will send over your documents for pre-op clearance to [facility]   I have ordered the following labs for you:   Lab Orders         Lipid Profile         Microalbumin / Creatinine Urine Ratio         Basic metabolic panel with GFR         Glucose, capillary         POC Hbg A1C      Referrals ordered today:    Referral Orders         Ambulatory referral to Ophthalmology      I have ordered the following medication/changed the following medications:   Stop the following medications: Medications Discontinued During This Encounter  Medication Reason   tirzepatide  (MOUNJARO ) 2.5 MG/0.5ML Pen    allopurinol  (ZYLOPRIM ) 100 MG tablet Reorder     Start the following medications: Meds ordered this encounter  Medications   allopurinol  (ZYLOPRIM ) 100 MG tablet    Sig: Take 1 tablet (100 mg total) by mouth daily.    Dispense:  90 tablet    Refill:  2   tirzepatide  (MOUNJARO ) 5 MG/0.5ML Pen    Sig: Inject 5 mg into the skin once a week.    Dispense:  2 mL    Refill:  0   tirzepatide  (MOUNJARO ) 7.5 MG/0.5ML Pen    Sig: Inject 7.5 mg into the skin once a week.    Dispense:  2 mL    Refill:  0   tirzepatide  (MOUNJARO ) 10 MG/0.5ML Pen    Sig: Inject 10 mg into the skin once a week.    Dispense:  2 mL    Refill:  0     Follow up: 3 months DM    Remember: Should you have any questions or concerns please call the internal medicine clinic at [phone].    Dr. Toma [facility]

## 2024-09-20 ENCOUNTER — Ambulatory Visit: Payer: Self-pay | Admitting: Student

## 2024-09-20 LAB — LIPID PANEL
Chol/HDL Ratio: 3.3 ratio (ref 0.0–5.0)
Cholesterol, Total: 182 mg/dL (ref 100–199)
HDL: 55 mg/dL
LDL Chol Calc (NIH): 119 mg/dL — ABNORMAL HIGH (ref 0–99)
Triglycerides: 37 mg/dL (ref 0–149)
VLDL Cholesterol Cal: 8 mg/dL (ref 5–40)

## 2024-09-20 LAB — MICROALBUMIN / CREATININE URINE RATIO
Creatinine, Urine: 68.5 mg/dL
Microalb/Creat Ratio: 5 mg/g{creat} (ref 0–29)
Microalbumin, Urine: 3.6 ug/mL

## 2024-09-20 LAB — BASIC METABOLIC PANEL WITH GFR
BUN/Creatinine Ratio: 14 (ref 9–20)
BUN: 16 mg/dL (ref 6–24)
CO2: 19 mmol/L — ABNORMAL LOW (ref 20–29)
Calcium: 9.8 mg/dL (ref 8.7–10.2)
Chloride: 102 mmol/L (ref 96–106)
Creatinine, Ser: 1.14 mg/dL (ref 0.76–1.27)
Glucose: 93 mg/dL (ref 70–99)
Potassium: 4.2 mmol/L (ref 3.5–5.2)
Sodium: 142 mmol/L (ref 134–144)
eGFR: 76 mL/min/{1.73_m2}
